# Patient Record
Sex: Female | Born: 1970 | Race: Black or African American | Hispanic: No | Marital: Single | Smoking: Never smoker
Health system: Southern US, Community
[De-identification: ages and names within clinical notes are randomized; demographics above are authoritative.]

## PROBLEM LIST (undated history)

## (undated) ENCOUNTER — Emergency Department: Payer: Self-pay

## (undated) DIAGNOSIS — N189 Chronic kidney disease, unspecified: Secondary | ICD-10-CM

## (undated) DIAGNOSIS — J189 Pneumonia, unspecified organism: Secondary | ICD-10-CM

## (undated) DIAGNOSIS — I10 Essential (primary) hypertension: Secondary | ICD-10-CM

## (undated) DIAGNOSIS — D649 Anemia, unspecified: Secondary | ICD-10-CM

## (undated) DIAGNOSIS — I499 Cardiac arrhythmia, unspecified: Secondary | ICD-10-CM

## (undated) DIAGNOSIS — J45909 Unspecified asthma, uncomplicated: Secondary | ICD-10-CM

## (undated) DIAGNOSIS — D219 Benign neoplasm of connective and other soft tissue, unspecified: Secondary | ICD-10-CM

## (undated) HISTORY — PX: TUBAL LIGATION: SHX77

## (undated) SURGERY — TEMPORARY DIALYSIS CATHETER
Anesthesia: LOCAL

---

## 2003-11-13 ENCOUNTER — Emergency Department: Payer: Self-pay | Admitting: Emergency Medicine

## 2003-11-13 ENCOUNTER — Other Ambulatory Visit: Payer: Self-pay

## 2004-02-16 ENCOUNTER — Emergency Department: Payer: Self-pay | Admitting: Emergency Medicine

## 2004-06-15 ENCOUNTER — Emergency Department: Payer: Self-pay | Admitting: Unknown Physician Specialty

## 2004-06-17 ENCOUNTER — Ambulatory Visit: Payer: Self-pay | Admitting: Unknown Physician Specialty

## 2004-07-02 ENCOUNTER — Emergency Department: Payer: Self-pay | Admitting: Emergency Medicine

## 2006-03-30 ENCOUNTER — Ambulatory Visit: Payer: Self-pay | Admitting: Emergency Medicine

## 2006-03-30 ENCOUNTER — Emergency Department: Payer: Self-pay | Admitting: Emergency Medicine

## 2006-10-10 IMAGING — US ABDOMEN ULTRASOUND
1 series · 17 of 25 positions shown · non-contrast
Comparison: none

REASON FOR EXAM: Abd Pain
COMMENTS:

[Series 1: abdomen ultrasound · 17 of 54 slices shown]
[im 1/54]
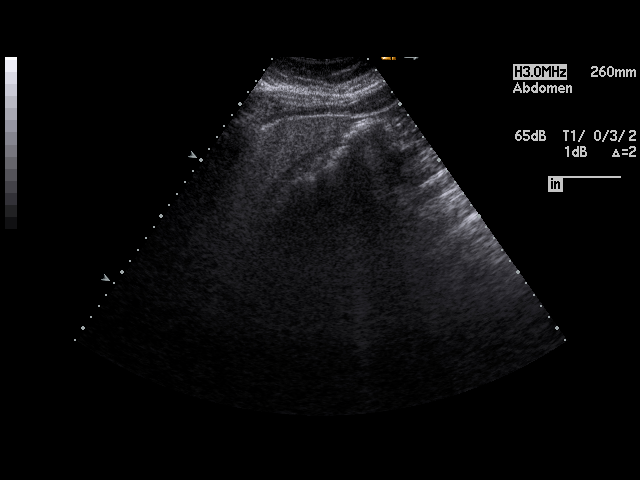
[im 5/54]
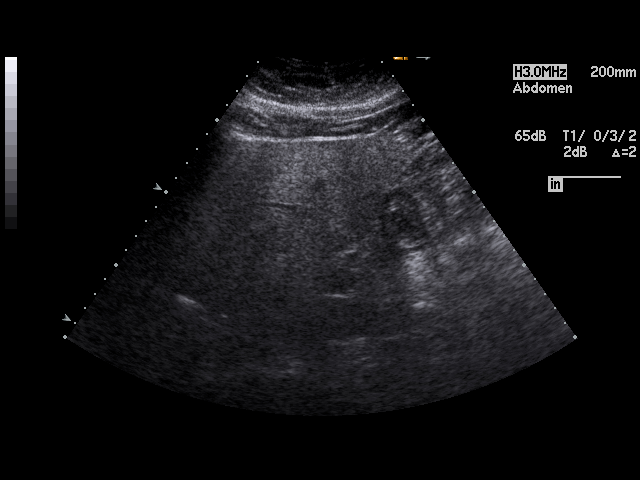
[im 7/54]
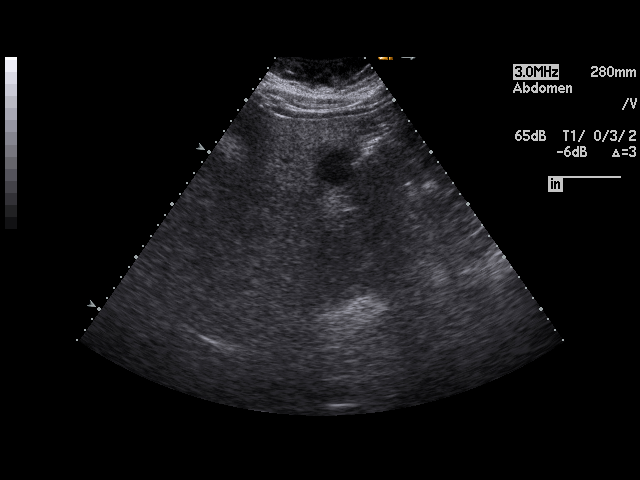
[im 12/54]
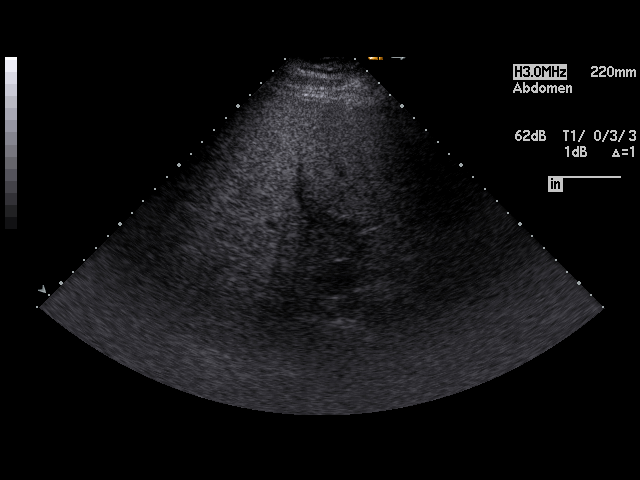
[im 14/54]
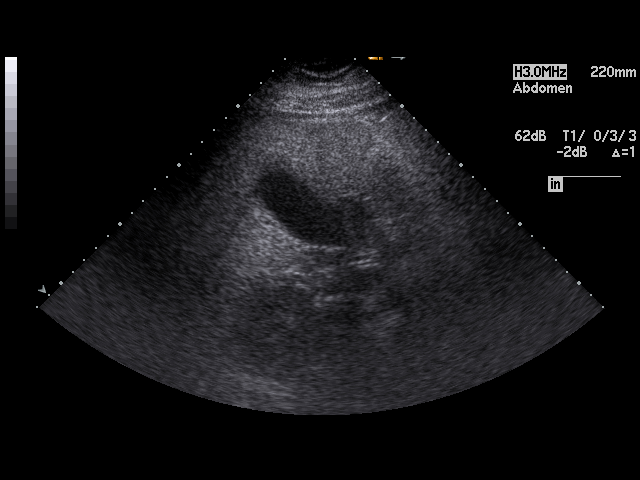
[im 18/54]
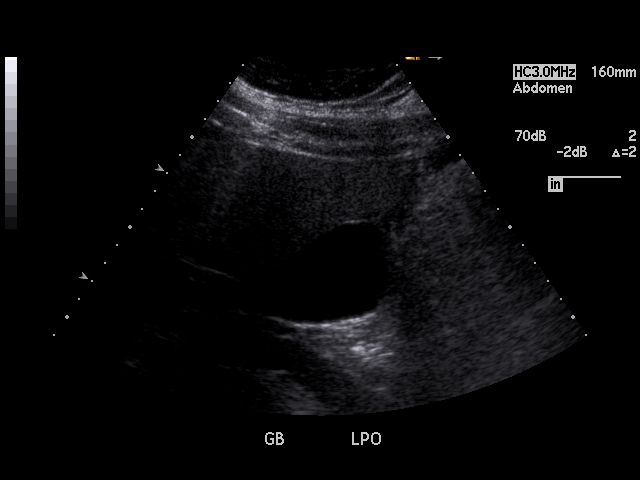
[im 20/54]
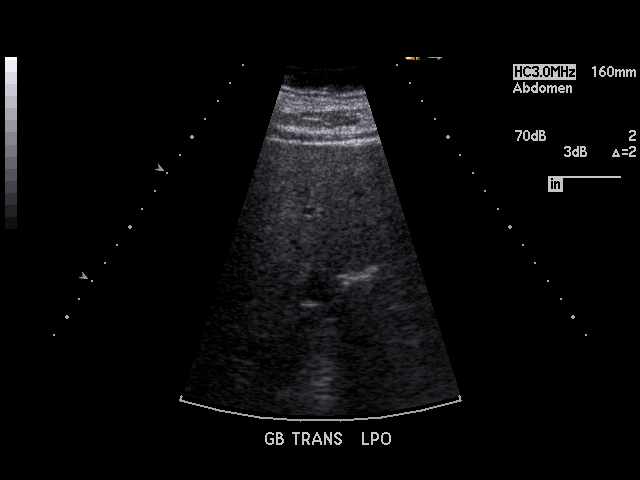
[im 25/54]
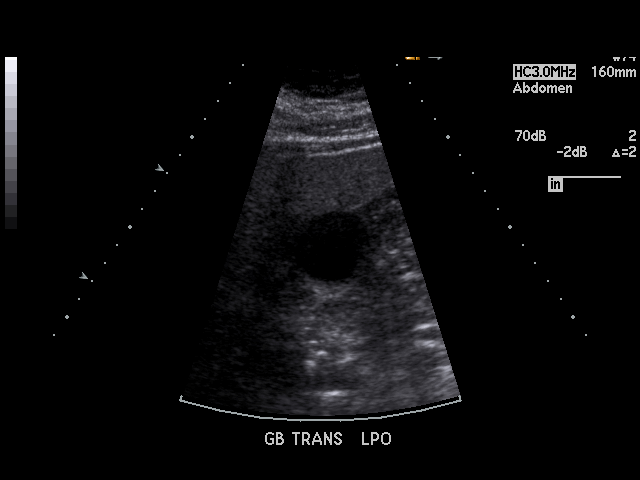
[im 27/54]
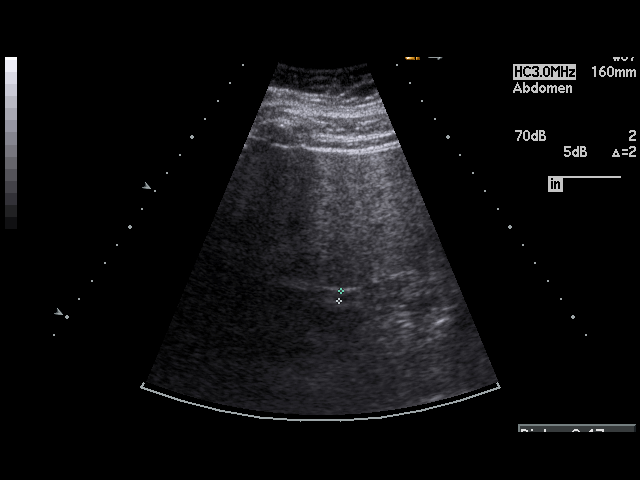
[im 29/54]
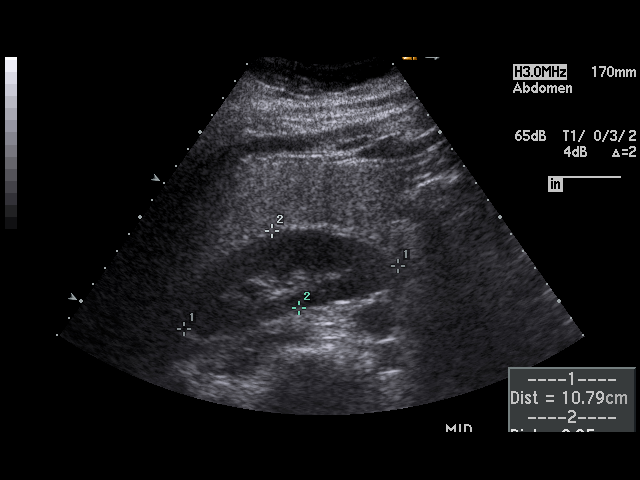
[im 34/54]
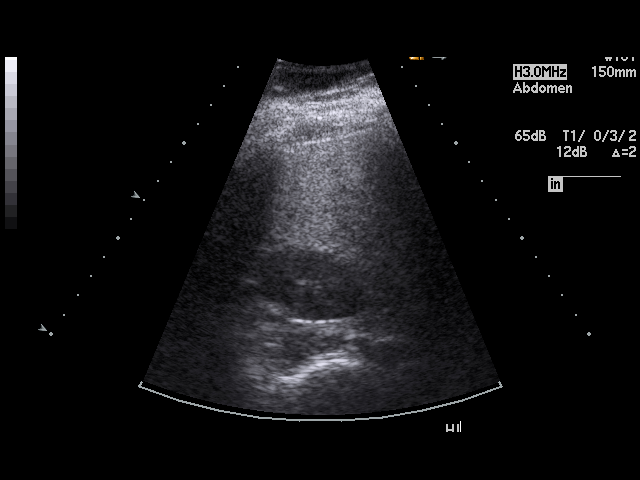
[im 36/54]
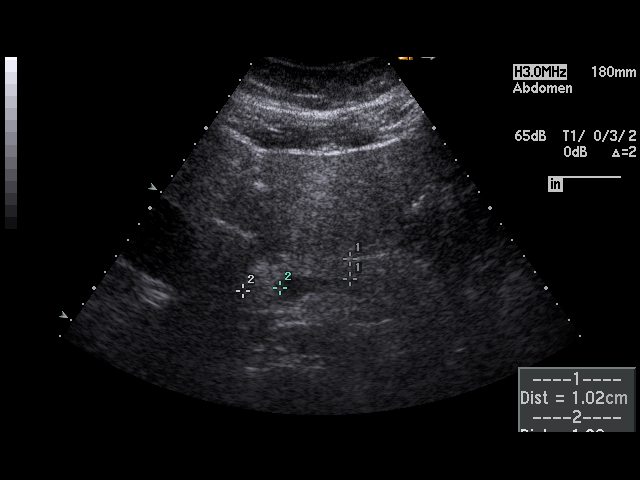
[im 40/54]
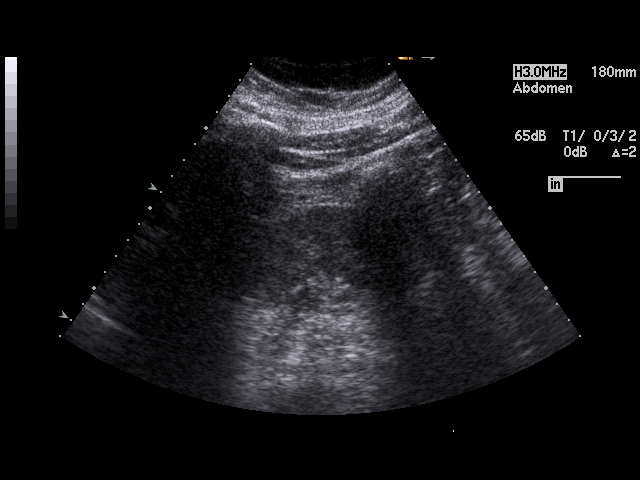
[im 42/54]
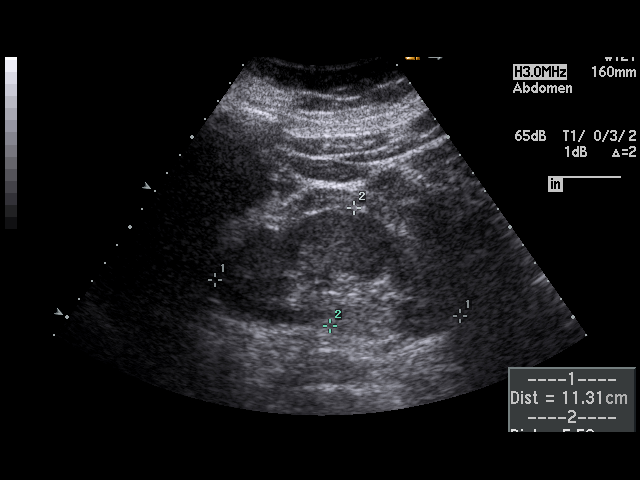
[im 47/54]
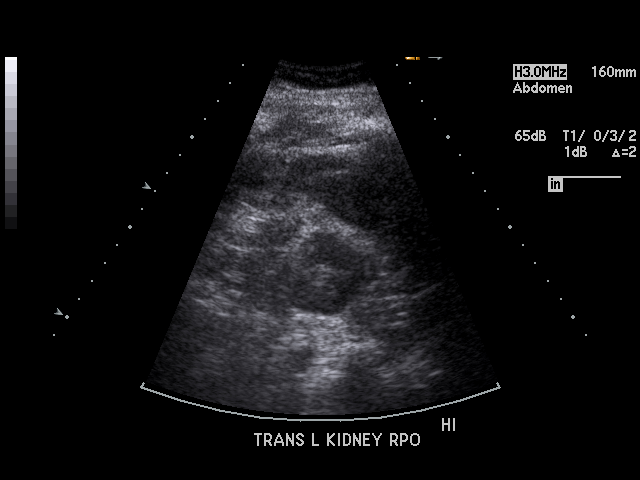
[im 49/54]
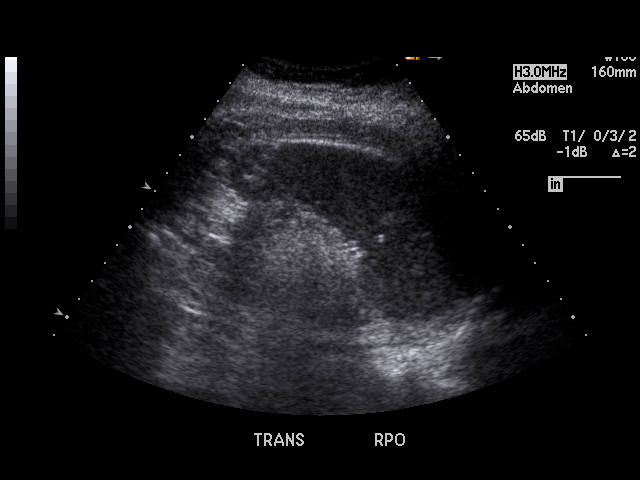
[im 54/54]
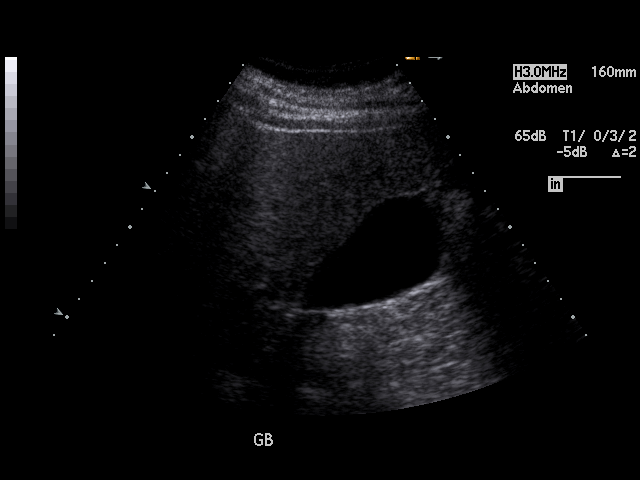

[17 of 25 positions shown; findings below may reference images not displayed]

PROCEDURE:     US  - US ABDOMEN GENERAL SURVEY  - June 17, 2004 [DATE]

RESULT:        The liver shows increased echogenicity suspicious for fatty
infiltration.  No focal hepatic mass lesions are seen.  The spleen size is
normal.  The pancreas is normal in appearance.  No gallstones are seen.
There is no thickening of the gallbladder wall.  The common bile duct
measures 4.7 mm in diameter which is within normal limits.   The kidneys
show no hydronephrosis.  There is no ascites.
IMPRESSION: 1.     Probably fatty infiltration of the liver.
2.     Otherwise normal study.

## 2007-09-08 ENCOUNTER — Other Ambulatory Visit: Payer: Self-pay

## 2007-09-08 ENCOUNTER — Emergency Department: Payer: Self-pay | Admitting: Emergency Medicine

## 2008-08-09 ENCOUNTER — Emergency Department: Payer: Self-pay | Admitting: Emergency Medicine

## 2010-01-08 ENCOUNTER — Emergency Department: Payer: Self-pay | Admitting: Emergency Medicine

## 2010-01-09 ENCOUNTER — Emergency Department: Payer: Self-pay | Admitting: Emergency Medicine

## 2010-06-12 ENCOUNTER — Emergency Department: Payer: Self-pay | Admitting: Emergency Medicine

## 2012-09-20 ENCOUNTER — Observation Stay: Payer: Self-pay | Admitting: Student

## 2012-09-20 LAB — CK TOTAL AND CKMB (NOT AT ARMC)
CK, Total: 109 U/L (ref 21–215)
CK, Total: 94 U/L (ref 21–215)
CK-MB: <0.5 ng/mL — ABNORMAL LOW (ref 0.5–3.6)
CK-MB: <0.5 ng/mL — ABNORMAL LOW (ref 0.5–3.6)

## 2012-09-20 LAB — CBC
MCH: 27.3 pg (ref 26.0–34.0)
MCHC: 33.5 g/dL (ref 32.0–36.0)
RBC: 4.3 10*6/uL (ref 3.80–5.20)
RDW: 18 % — ABNORMAL HIGH (ref 11.5–14.5)
WBC: 6 10*3/uL (ref 3.6–11.0)

## 2012-09-20 LAB — BASIC METABOLIC PANEL
Anion Gap: 5 — ABNORMAL LOW (ref 7–16)
Chloride: 107 mmol/L (ref 98–107)
Co2: 27 mmol/L (ref 21–32)
EGFR (African American): >60
EGFR (Non-African Amer.): >60

## 2012-09-20 LAB — TROPONIN I: Troponin-I: <0.02 ng/mL

## 2012-09-21 DIAGNOSIS — R079 Chest pain, unspecified: Secondary | ICD-10-CM

## 2012-09-21 LAB — CBC WITH DIFFERENTIAL/PLATELET
Basophil #: 0 10*3/uL (ref 0.0–0.1)
Basophil %: 0.6 %
Eosinophil #: 0.2 10*3/uL (ref 0.0–0.7)
HGB: 11.2 g/dL — ABNORMAL LOW (ref 12.0–16.0)
Lymphocyte #: 1.7 10*3/uL (ref 1.0–3.6)
Lymphocyte %: 32.1 %
MCH: 27 pg (ref 26.0–34.0)
MCHC: 33 g/dL (ref 32.0–36.0)
MCV: 82 fL (ref 80–100)
Monocyte #: 0.4 x10 3/mm (ref 0.2–0.9)
Monocyte %: 7.8 %
Neutrophil %: 56.6 %
Platelet: 160 10*3/uL (ref 150–440)
RDW: 18.2 % — ABNORMAL HIGH (ref 11.5–14.5)

## 2012-09-21 LAB — LIPID PANEL
Triglycerides: 97 mg/dL (ref 0–200)
VLDL Cholesterol, Calc: 19 mg/dL (ref 5–40)

## 2012-09-21 LAB — CK TOTAL AND CKMB (NOT AT ARMC)
CK, Total: 86 U/L (ref 21–215)
CK-MB: <0.5 ng/mL — ABNORMAL LOW (ref 0.5–3.6)

## 2012-09-21 LAB — HEMOGLOBIN A1C: Hemoglobin A1C: 6.1 % (ref 4.2–6.3)

## 2012-09-21 LAB — BASIC METABOLIC PANEL
BUN: 11 mg/dL (ref 7–18)
Calcium, Total: 8.8 mg/dL (ref 8.5–10.1)
Chloride: 104 mmol/L (ref 98–107)

## 2012-09-22 ENCOUNTER — Emergency Department: Payer: Self-pay | Admitting: Internal Medicine

## 2012-09-22 LAB — COMPREHENSIVE METABOLIC PANEL
Albumin: 3.9 g/dL (ref 3.4–5.0)
Alkaline Phosphatase: 57 U/L (ref 50–136)
Anion Gap: 9 (ref 7–16)
BUN: 10 mg/dL (ref 7–18)
Bilirubin,Total: 0.6 mg/dL (ref 0.2–1.0)
Calcium, Total: 8.9 mg/dL (ref 8.5–10.1)
Chloride: 104 mmol/L (ref 98–107)
Creatinine: 0.99 mg/dL (ref 0.60–1.30)
EGFR (African American): >60
EGFR (Non-African Amer.): >60
Glucose: 99 mg/dL (ref 65–99)
Osmolality: 273 (ref 275–301)
Potassium: 3.7 mmol/L (ref 3.5–5.1)
SGOT(AST): 64 U/L — ABNORMAL HIGH (ref 15–37)
Total Protein: 7.6 g/dL (ref 6.4–8.2)

## 2012-09-22 LAB — CBC
HCT: 38 % (ref 35.0–47.0)
MCH: 27.2 pg (ref 26.0–34.0)
MCHC: 33.5 g/dL (ref 32.0–36.0)
RBC: 4.69 10*6/uL (ref 3.80–5.20)
WBC: 7.8 10*3/uL (ref 3.6–11.0)

## 2013-01-07 ENCOUNTER — Emergency Department: Payer: Self-pay | Admitting: Emergency Medicine

## 2013-04-25 ENCOUNTER — Emergency Department: Payer: Self-pay | Admitting: Emergency Medicine

## 2013-04-25 LAB — COMPREHENSIVE METABOLIC PANEL
ALK PHOS: 70 U/L
Albumin: 4.2 g/dL (ref 3.4–5.0)
Anion Gap: 5 — ABNORMAL LOW (ref 7–16)
BILIRUBIN TOTAL: 0.7 mg/dL (ref 0.2–1.0)
BUN: 8 mg/dL (ref 7–18)
CHLORIDE: 101 mmol/L (ref 98–107)
Calcium, Total: 8.4 mg/dL — ABNORMAL LOW (ref 8.5–10.1)
Co2: 29 mmol/L (ref 21–32)
Creatinine: 1.05 mg/dL (ref 0.60–1.30)
GLUCOSE: 93 mg/dL (ref 65–99)
Osmolality: 268 (ref 275–301)
Potassium: 4 mmol/L (ref 3.5–5.1)
SGOT(AST): 111 U/L — ABNORMAL HIGH (ref 15–37)
SGPT (ALT): 84 U/L — ABNORMAL HIGH (ref 12–78)
Sodium: 135 mmol/L — ABNORMAL LOW (ref 136–145)
Total Protein: 8.1 g/dL (ref 6.4–8.2)

## 2013-04-25 LAB — CBC WITH DIFFERENTIAL/PLATELET
Basophil #: 0.1 10*3/uL (ref 0.0–0.1)
Basophil %: 1 %
Eosinophil #: 0.1 10*3/uL (ref 0.0–0.7)
Eosinophil %: 1.3 %
HCT: 37.5 % (ref 35.0–47.0)
HGB: 12.3 g/dL (ref 12.0–16.0)
LYMPHS ABS: 1.9 10*3/uL (ref 1.0–3.6)
Lymphocyte %: 27.2 %
MCH: 27.8 pg (ref 26.0–34.0)
MCHC: 32.8 g/dL (ref 32.0–36.0)
MCV: 85 fL (ref 80–100)
MONOS PCT: 7.8 %
Monocyte #: 0.5 x10 3/mm (ref 0.2–0.9)
NEUTROS PCT: 62.7 %
Neutrophil #: 4.4 10*3/uL (ref 1.4–6.5)
Platelet: 198 10*3/uL (ref 150–440)
RBC: 4.43 10*6/uL (ref 3.80–5.20)
RDW: 17.2 % — AB (ref 11.5–14.5)
WBC: 6.9 10*3/uL (ref 3.6–11.0)

## 2013-11-09 ENCOUNTER — Emergency Department: Payer: Self-pay | Admitting: Emergency Medicine

## 2013-11-18 DIAGNOSIS — I8393 Asymptomatic varicose veins of bilateral lower extremities: Secondary | ICD-10-CM | POA: Insufficient documentation

## 2013-11-18 DIAGNOSIS — I872 Venous insufficiency (chronic) (peripheral): Secondary | ICD-10-CM | POA: Insufficient documentation

## 2014-05-26 NOTE — H&P (Signed)
PATIENT NAME:  Megan Lambert, Megan Lambert MR#:  [id number] DATE OF BIRTH:  Jun 24, 1970  DATE OF ADMISSION:  09/20/2012  REFERRING PHYSICIAN: Hinda Kehr, MD  PRIMARY CARE PHYSICIAN: [facility]  CHIEF COMPLAINT: Chest pain.   HISTORY OF PRESENT ILLNESS: [name] is a nice 44 year old female with history of GERD and asthma who comes with new onset chest pain. Apparently, the patient was getting ready to go to church yesterday, got in the car, coming out of the car the patient started having some chest pain which lasted 10 to 15 minutes. It was really severe, 10 out of 10, and resolved by itself. The patient had chest pain associated with nausea located retrosternally with radiation up to the shoulder with numbness of the right upper extremity. At the same moment, the patient was short of breath and again the patient went up by herself. There were not any significant factors that were triggering the pain. The patient says that she was not doing any significant physical activity. The patient states that she has never had this happen before. During the day, the patient was fine. She just forgot about it and this morning just right after waking up the patient started having some chest pain again. It is retrosternal, more on the right side than the left, stabbing, associated with tingling of the right upper extremity. Today the patient did not have any shortness of breath. Yesterday the patient was sweating profusely, but not today. The pain today was 8 out of 10 and it lasted up until she arrived to the ER. This started around 8 and the patient started having some relief right after taking nitroglycerin here in the ER. During the time of my evaluation, the patient still had some pain, 2 to 3 out of 10, with the same characteristics. The patient is admitted for evaluation of this problem.   REVIEW OF SYSTEMS:  Twelve system review is done.  CONSTITUTIONAL: No fever, fatigue, weakness, weight loss or weight  gain.  EYES: No blurry vision, double vision.  EARS, NOSE, THROAT: No tinnitus, difficulty swallowing or postnasal drip.  RESPIRATORY: No cough, wheezing, hemoptysis, painful respirations or COPD. She has history of asthma and she only has asthma attacks whenever she is exposed to any allergens. It has not happened in a long time. She does not even take medications for it unless exacerbations.   CARDIOVASCULAR: No previous history of chest pain, orthopnea, edema, arrhythmias, palpitations or syncope.  GASTROINTESTINAL: No nausea, vomiting, abdominal pain, constipation or diarrhea. No melena.  GENITOURINARY: No dysuria, hematuria or changes in frequency.  GYNECOLOGIC: No breast masses.  ENDOCRINE: No polyuria, polydipsia, polyphagia, cold or heat intolerance. HEMATOLOGIC AND LYMPHATIC: No anemia, easy bruising or bleeding.  MUSCULOSKELETAL: No significant shoulder pain, back pain, redness or swelling of joints.  NEUROLOGIC: No numbness, tingling, CVAs or TIAs. PSYCHIATRIC: No significant insomnia, depression or anxiety.   PAST MEDICAL HISTORY: 1.  GERD. 2.  Asthma.   ALLERGIES: No known drug allergies.  PAST SURGICAL HISTORY: BTL.   MEDICATIONS: She does not take any medications at all. She used to take medications for GERD, but no longer.   FAMILY HISTORY: Positive for MI in her father at the age of 85. He died later on after another MI. CVA in her uncle. Diabetes in her mother. Denies any cancer.   SOCIAL HISTORY: The patient has never smoked. She drinks very rarely. She does not use drugs. She is unemployed at the moment. She has 2 kids  that live with her. She has been a little bit stressed about her unemployment. She is single.   PHYSICAL EXAMINATION: VITAL SIGNS: Blood pressure 149/84, pulse 84, respirations 18, temperature 98.5. Oxygen saturation 98% on room air.  GENERAL: The patient is alert and oriented x 3, in no acute distress. No respiratory distress. Hemodynamically stable.   HEENT: Pupils are equal and reactive. Extraocular movements are intact. Mucosa is moist. Anicteric sclerae. Pink conjunctivae. No oral lesions. No oropharyngeal exudates.  NECK: Supple. No JVD. No thyromegaly. No adenopathy. No rigidity.  HEART: Regular rate and rhythm. No murmurs, rubs or gallops are appreciated at this moment. No displaced PMI. No tenderness to palpation of anterior chest wall. Chest pain is not reproducible. LUNGS: Clear without any wheezing or crepitus. No use of accessory muscles.  ABDOMEN: Soft, nontender, nondistended. No hepatosplenomegaly. No masses. Bowel sounds are positive. GENITAL: Negative for external lesions.  EXTREMITIES: No edema, cyanosis or clubbing. Pulses +2. Capillary refill less than 3. The patient states she has edema of the lower extremities, but at this moment there is none. She had some changes of the skin secondary to varicose veins and chronic venous insufficiency.  MUSCULOSKELETAL: No significant joint effusions or joint edema.  SKIN: No rashes or petechiae. No new lesions. LYMPHATIC: Negative for lymphadenopathy in the neck or supraclavicular area.  VASCULAR: Good capillary refill. Pulses +2.  NEUROLOGIC: Cranial nerves II through XII intact. His strength is 5 out of 5 in all 4 extremities. No focal findings. PSYCHIATRIC: Normal without any signs of agitation. Alert and oriented x 3.   LABORATORY AND DIAGNOSTICS: Glucose 91, creatinine 0.8, sodium 139. Other electrolytes within normal limits. Pregnancy test is negative. Troponin is 0.02. White count is 6, hemoglobin is 11.8, platelet count 176.   EKG: No ST depression or elevation.   Chest x-ray: No abnormal findings.  ASSESSMENT AND PLAN: 1.  Chest pain. The patient is admitted for evaluation of chest pain, Myoview is going to be ordered tomorrow. Cardiac enzymes are negative. No ST depression or elevation on EKG. The patient has a really good story and the pain was relieved with nitroglycerin  for what we are going to add a beta blocker. I think at this moment we need to fully anticoagulate the patient, but if there are any changes in her troponins we are going to start her on a heparin drip. Right now we are just going to do regular deep vein thrombosis prophylaxis. Aspirin 325 mg given, started her on a statin, check lipid levels, check Hemoglobin A1c. Add beta blocker. Her vital signs are stable. The patient is going to be admitted for observation and do a Myoview in the morning. Since the patient has edema of the lower extremities on and off, we are going to get an echocardiogram. Restart proton pump inhibitors. 2.  Asthma is well controlled. P.r.n. nebulizers and albuterol.  3.  Gastrointestinal prophylaxis with proton pump inhibitor again.  CODE STATUS: The patient is a FULL CODE.   TIME SPENT: I spent about 50 minutes with this patient. ____________________________ [place] Sink, MD rsg:sb D: 09/20/2012 14:53:55 ET T: 09/20/2012 15:12:12 ET JOB#: [id number]  cc: [place] Sink, MD, <Dictator> Misa Fedorko America Brown MD ELECTRONICALLY SIGNED 10/01/2012 12:30

## 2014-05-26 NOTE — Discharge Summary (Signed)
PATIENT NAME:  Megan Lambert, Megan Lambert MR#:  [id number] DATE OF BIRTH:  Oct 20, 1970  DATE OF ADMISSION:  09/20/2012 DATE OF DISCHARGE:  09/21/2012  CHIEF COMPLAINT: Chest pain.  DISCHARGE DIAGNOSES: 1. Chest pain, noncardiac, possibly anxiety driven.  2. Hypertension.  3. Gastroesophageal reflux disease.  4. Asthma.   DISCHARGE MEDICATIONS:  1. Lisinopril 10 mg daily.  2. Aspirin 81 mg daily.   DIET: Low sodium, low fat, low cholesterol.   ACTIVITY: As tolerated.   DISCHARGE INSTRUCTIONS: Please follow with PCP within 1 to 2 weeks for blood pressure check.   DISPOSITION: Home.   HISTORY OF PRESENT ILLNESS AND HOSPITAL COURSE: For full details of H and P, please see the dictation on August 18 by Dr. Laurin Coder, but briefly, this is a 44 year old female with well-controlled asthma with episodic flares, GERD, who is on no significant medications, who came into the hospital with complaints of chest pain. The patient was getting ready to go to a new church, apparently got into the car and started to have some chest pain, lasting 10 to 15 minutes, with some radiation to the shoulder, numbness in the right upper extremity. She was admitted to the hospitalist service for further evaluation and management. She underwent cyclic cardiac markers which were all negative and underwent a stress test on the 19th as well as an echocardiogram. The echocardiogram showed normal EF of 55% to 60%. The stress test did not show any ischemia and was a low-risk study. She did have elevated blood pressure, and lisinopril was started here. Her LDL was in the 120s, and although statin was offered, she deferred and wanted to work on diet and exercise, and I informed her that she needs to recheck this within the next 4 to 6 months. She has been having some anxiety issues, including was about to go to a new church as well as lost her job recently, and her symptoms likely are anxiety related. At this point, she will be discharged with  outpatient followup and blood pressure monitoring by her PCP. She had A1c of 6.1. Troponins were negative x3, and TSH was 6.18. She had no fever or white count.   TOTAL TIME SPENT: 32 minutes.   CODE STATUS: The patient is full code.   ____________________________ Vivien Presto, MD sa:OSi D: 09/22/2012 11:16:03 ET T: 09/22/2012 12:05:10 ET JOB#: [id number]  cc: Vivien Presto, MD, <Dictator> Vivien Presto MD ELECTRONICALLY SIGNED 10/11/2012 12:04

## 2015-01-26 ENCOUNTER — Encounter: Payer: Self-pay | Admitting: Emergency Medicine

## 2015-01-26 ENCOUNTER — Emergency Department
Admission: EM | Admit: 2015-01-26 | Discharge: 2015-01-26 | Disposition: A | Discharge status: Home / Self Care | Payer: Self-pay | Attending: Emergency Medicine | Admitting: Emergency Medicine

## 2015-01-26 DIAGNOSIS — N3000 Acute cystitis without hematuria: Secondary | ICD-10-CM | POA: Insufficient documentation

## 2015-01-26 DIAGNOSIS — R1110 Vomiting, unspecified: Secondary | ICD-10-CM

## 2015-01-26 DIAGNOSIS — Z79899 Other long term (current) drug therapy: Secondary | ICD-10-CM | POA: Insufficient documentation

## 2015-01-26 DIAGNOSIS — I10 Essential (primary) hypertension: Secondary | ICD-10-CM | POA: Insufficient documentation

## 2015-01-26 DIAGNOSIS — R197 Diarrhea, unspecified: Secondary | ICD-10-CM | POA: Insufficient documentation

## 2015-01-26 HISTORY — DX: Essential (primary) hypertension: I10

## 2015-01-26 LAB — URINALYSIS COMPLETE WITH MICROSCOPIC (ARMC ONLY)
Bacteria, UA: NONE SEEN
Bilirubin Urine: NEGATIVE
GLUCOSE, UA: NEGATIVE mg/dL
KETONES UR: NEGATIVE mg/dL
NITRITE: NEGATIVE
Protein, ur: 100 mg/dL — AB
SPECIFIC GRAVITY, URINE: 1.019 (ref 1.005–1.030)
pH: 5 (ref 5.0–8.0)

## 2015-01-26 LAB — COMPREHENSIVE METABOLIC PANEL
ALK PHOS: 66 U/L (ref 38–126)
ALT: 41 U/L (ref 14–54)
AST: 96 U/L — AB (ref 15–41)
Albumin: 4.3 g/dL (ref 3.5–5.0)
Anion gap: 12 (ref 5–15)
BUN: 10 mg/dL (ref 6–20)
CALCIUM: 7.3 mg/dL — AB (ref 8.9–10.3)
CHLORIDE: 99 mmol/L — AB (ref 101–111)
CO2: 24 mmol/L (ref 22–32)
CREATININE: 0.75 mg/dL (ref 0.44–1.00)
GFR calc Af Amer: >60 mL/min (ref >60)
Glucose, Bld: 87 mg/dL (ref 65–99)
Potassium: 3.1 mmol/L — ABNORMAL LOW (ref 3.5–5.1)
Sodium: 135 mmol/L (ref 135–145)
Total Bilirubin: 1.1 mg/dL (ref 0.3–1.2)
Total Protein: 8.1 g/dL (ref 6.5–8.1)

## 2015-01-26 LAB — CBC
HCT: 32.3 % — ABNORMAL LOW (ref 35.0–47.0)
HEMOGLOBIN: 10.4 g/dL — AB (ref 12.0–16.0)
MCH: 27.3 pg (ref 26.0–34.0)
MCHC: 32.2 g/dL (ref 32.0–36.0)
MCV: 84.8 fL (ref 80.0–100.0)
Platelets: 173 10*3/uL (ref 150–440)
RBC: 3.81 MIL/uL (ref 3.80–5.20)
RDW: 17.1 % — AB (ref 11.5–14.5)
WBC: 5.2 10*3/uL (ref 3.6–11.0)

## 2015-01-26 LAB — LIPASE, BLOOD: LIPASE: 36 U/L (ref 11–51)

## 2015-01-26 MED ORDER — ONDANSETRON 4 MG PO TBDP
4.0000 mg | ORAL_TABLET | Freq: Once | ORAL | Status: AC
  Administered 2015-01-26: 4 mg via ORAL
  Filled 2015-01-26: qty 1

## 2015-01-26 MED ORDER — SULFAMETHOXAZOLE-TRIMETHOPRIM 800-160 MG PO TABS: 1.0000 | ORAL_TABLET | Freq: Two times a day (BID) | ORAL | Status: DC

## 2015-01-26 MED ORDER — ONDANSETRON HCL 4 MG PO TABS: 4.0000 mg | ORAL_TABLET | Freq: Three times a day (TID) | ORAL | Status: DC | PRN

## 2015-01-26 MED ORDER — ONDANSETRON 4 MG PO TBDP
4.0000 mg | ORAL_TABLET | Freq: Once | ORAL | Status: AC | PRN
  Administered 2015-01-26: 4 mg via ORAL
  Filled 2015-01-26: qty 1

## 2015-01-26 NOTE — ED Provider Notes (Signed)
[facility] Emergency Department Provider Note ____________________________________________  Time seen: Approximately 4:34 PM  I have reviewed the triage vital signs and the nursing notes.   HISTORY  Chief Complaint Abdominal Pain; Diarrhea; and Emesis   HPI Megan Lambert is a 44 y.o. female Megan Lambert for evaluation of nausea, vomiting, diarrhea, and abdominal pain. She states that she was awakened this morning at about 5:30 AM and had an episode of diarrhea and vomiting. Since that she went back to bed and since that time has had another episode of vomiting and diarrhea. She states that she has had intermittent abdominal cramping since this morning. She denies fever. She has been unable to tolerate food or fluids. She states her grandson was ill recently with similar symptoms.   Past Medical History  Diagnosis Date  . Hypertension     There are no active problems to display for this patient.   Past Surgical History  Procedure Laterality Date  . Tubal ligation      Current Outpatient Rx  Name  Route  Sig  Dispense  Refill  . hydrochlorothiazide (MICROZIDE) 12.5 MG capsule   Oral   Take 12.5 mg by mouth daily.         . ondansetron (ZOFRAN) 4 MG tablet   Oral   Take 1 tablet (4 mg total) by mouth every 8 (eight) hours as needed for nausea or vomiting.   20 tablet   0   . sulfamethoxazole-trimethoprim (BACTRIM DS,SEPTRA DS) 800-160 MG tablet   Oral   Take 1 tablet by mouth 2 (two) times daily.   10 tablet   0     Allergies Lisinopril  No family history on file.  Social History Social History  Substance Use Topics  . Smoking status: Never Smoker   . Smokeless tobacco: None  . Alcohol Use: Yes     Comment: occasionally    Review of Systems Constitutional: No fever/chills Eyes: No visual changes. ENT: No sore throat. Cardiovascular: Denies chest pain. Respiratory: Denies shortness of breath. Gastrointestinal:  Intermittent abdominal pain.  Positive for nausea and vomiting x 2.  Positive for diarrhea.  No constipation. Genitourinary: Negative for dysuria. Musculoskeletal: Negative for back pain. Skin: Negative for rash. Neurological: Negative for headaches, focal weakness or numbness.  10-point ROS otherwise negative.  ____________________________________________   PHYSICAL EXAM:  VITAL SIGNS: ED Triage Vitals  Enc Vitals Group     BP 01/26/15 1457 181/107 mmHg     Pulse Rate 01/26/15 1457 86     Resp --      Temp 01/26/15 1457 98.3 F (36.8 C)     Temp Source 01/26/15 1457 Oral     SpO2 01/26/15 1457 99 %     Weight 01/26/15 1457 240 lb (108.863 kg)     Height 01/26/15 1457 6' (1.829 m)     Head Cir --      Peak Flow --      Pain Score 01/26/15 1457 6     Pain Loc --      Pain Edu? --      Excl. in [place]? --     Constitutional: Alert and oriented. Well appearing and in no acute distress. Eyes: Conjunctivae are normal. PERRL. EOMI. Head: Atraumatic. Nose: No congestion/rhinnorhea. Mouth/Throat: Mucous membranes are moist.  Oropharynx non-erythematous. Neck: No stridor.   Cardiovascular: Normal rate, regular rhythm. Grossly normal heart sounds.  Good peripheral circulation. Respiratory: Normal respiratory effort.  No retractions. Lungs CTAB. Gastrointestinal:  Soft and nontender. No distention. No abdominal bruits. No CVA tenderness. Musculoskeletal: No lower extremity tenderness nor edema.  No joint effusions. Neurologic:  Normal speech and language. No gross focal neurologic deficits are appreciated. No gait instability. Skin:  Skin is warm, dry and intact. No rash noted. Psychiatric: Mood and affect are normal. Speech and behavior are normal.  ____________________________________________   LABS (all labs ordered are listed, but only abnormal results are displayed)  Labs Reviewed  COMPREHENSIVE METABOLIC PANEL - Abnormal; Notable for the following:    Potassium 3.1 (*)     Chloride 99 (*)    Calcium 7.3 (*)    AST 96 (*)    All other components within normal limits  CBC - Abnormal; Notable for the following:    Hemoglobin 10.4 (*)    HCT 32.3 (*)    RDW 17.1 (*)    All other components within normal limits  URINALYSIS COMPLETEWITH MICROSCOPIC ([facility] ONLY) - Abnormal; Notable for the following:    Color, Urine AMBER (*)    APPearance CLOUDY (*)    Hgb urine dipstick 1+ (*)    Protein, ur 100 (*)    Leukocytes, UA 2+ (*)    Squamous Epithelial / LPF 6-30 (*)    All other components within normal limits  LIPASE, BLOOD   ____________________________________________  EKG   ____________________________________________  RADIOLOGY   ____________________________________________   PROCEDURES  Procedure(s) performed: None  Critical Care performed: No  ____________________________________________   INITIAL IMPRESSION / ASSESSMENT AND PLAN / ED COURSE  Pertinent labs & imaging results that were available during my care of the patient were reviewed by me and considered in my medical decision making (see chart for details).  Discussed lab and urinalysis results with patient and her mother. Patient is to take the zofran 20-30 minutes before the bactrim. She is to follow up with the PCP of her choice when possible. She is to return to the emergency department for symptoms of concerns if unable to schedule an appointment. ____________________________________________   FINAL CLINICAL IMPRESSION(S) / ED DIAGNOSES  Final diagnoses:  Vomiting and diarrhea  Acute cystitis without hematuria      Victorino Dike, FNP 01/26/15 1727  Hinda Kehr, MD 01/26/15 2217

## 2015-01-26 NOTE — ED Notes (Signed)
Patient presents to the ED with abdominal pain, nausea, vomiting, and diarrhea.  Patient states abdominal pain started at 5:30am this morning.  Patient reports diarrhea x 2 today and vomiting x 2 as well.  Patient is complaining of abdominal pain around her umbilical area.  Area is nontender but patient states it feels, "upset, rumbling in there".

## 2015-01-26 NOTE — ED Notes (Signed)
C/o n/v/d

## 2015-02-01 ENCOUNTER — Ambulatory Visit: Payer: Self-pay | Admitting: Family Medicine

## 2015-02-15 ENCOUNTER — Ambulatory Visit: Payer: Medicaid Other | Admitting: Family Medicine

## 2017-01-02 ENCOUNTER — Other Ambulatory Visit: Payer: Self-pay

## 2017-01-02 ENCOUNTER — Encounter: Payer: Self-pay | Admitting: Emergency Medicine

## 2017-01-02 ENCOUNTER — Emergency Department
Admission: EM | Admit: 2017-01-02 | Discharge: 2017-01-02 | Disposition: A | Discharge status: Home / Self Care | Payer: Self-pay | Attending: Emergency Medicine | Admitting: Emergency Medicine

## 2017-01-02 DIAGNOSIS — J45909 Unspecified asthma, uncomplicated: Secondary | ICD-10-CM | POA: Insufficient documentation

## 2017-01-02 DIAGNOSIS — Z79899 Other long term (current) drug therapy: Secondary | ICD-10-CM | POA: Insufficient documentation

## 2017-01-02 DIAGNOSIS — I10 Essential (primary) hypertension: Secondary | ICD-10-CM | POA: Insufficient documentation

## 2017-01-02 HISTORY — DX: Unspecified asthma, uncomplicated: J45.909

## 2017-01-02 LAB — CBC WITH DIFFERENTIAL/PLATELET
Basophils Absolute: 0 10*3/uL (ref 0–0.1)
Basophils Relative: 1 %
EOS ABS: 0 10*3/uL (ref 0–0.7)
EOS PCT: 1 %
HCT: 27.7 % — ABNORMAL LOW (ref 35.0–47.0)
Hemoglobin: 9.3 g/dL — ABNORMAL LOW (ref 12.0–16.0)
LYMPHS PCT: 33 %
Lymphs Abs: 1.1 10*3/uL (ref 1.0–3.6)
MCH: 29.9 pg (ref 26.0–34.0)
MCHC: 33.5 g/dL (ref 32.0–36.0)
MCV: 89.4 fL (ref 80.0–100.0)
MONO ABS: 0.3 10*3/uL (ref 0.2–0.9)
Monocytes Relative: 9 %
Neutro Abs: 1.9 10*3/uL (ref 1.4–6.5)
Neutrophils Relative %: 56 %
PLATELETS: 119 10*3/uL — AB (ref 150–440)
RBC: 3.1 MIL/uL — ABNORMAL LOW (ref 3.80–5.20)
RDW: 17.3 % — AB (ref 11.5–14.5)
WBC: 3.4 10*3/uL — AB (ref 3.6–11.0)

## 2017-01-02 LAB — COMPREHENSIVE METABOLIC PANEL
ALT: 57 U/L — ABNORMAL HIGH (ref 14–54)
AST: 155 U/L — ABNORMAL HIGH (ref 15–41)
Albumin: 4.4 g/dL (ref 3.5–5.0)
Alkaline Phosphatase: 72 U/L (ref 38–126)
Anion gap: 17 — ABNORMAL HIGH (ref 5–15)
BUN: 14 mg/dL (ref 6–20)
CHLORIDE: 99 mmol/L — AB (ref 101–111)
CO2: 23 mmol/L (ref 22–32)
Calcium: 7.9 mg/dL — ABNORMAL LOW (ref 8.9–10.3)
Creatinine, Ser: 1 mg/dL (ref 0.44–1.00)
Glucose, Bld: 92 mg/dL (ref 65–99)
POTASSIUM: 3.7 mmol/L (ref 3.5–5.1)
SODIUM: 139 mmol/L (ref 135–145)
Total Bilirubin: 1.5 mg/dL — ABNORMAL HIGH (ref 0.3–1.2)
Total Protein: 7.6 g/dL (ref 6.5–8.1)

## 2017-01-02 LAB — TROPONIN I

## 2017-01-02 MED ORDER — HYDROCHLOROTHIAZIDE 25 MG PO TABS
25.0000 mg | ORAL_TABLET | Freq: Every day | ORAL | Status: DC
  Administered 2017-01-02: 25 mg via ORAL

## 2017-01-02 MED ORDER — HYDROCHLOROTHIAZIDE 25 MG PO TABS
ORAL_TABLET | ORAL | Status: AC
  Filled 2017-01-02: qty 1

## 2017-01-02 MED ORDER — HYDROCHLOROTHIAZIDE 12.5 MG PO CAPS
12.5000 mg | ORAL_CAPSULE | Freq: Every day | ORAL | 0 refills | Status: DC
Start: 2017-01-02 — End: 2018-03-29

## 2017-01-02 NOTE — ED Notes (Signed)
Pt presents with c/o HTN. States she has been out of her bp meds for 2 weeks and that she began to feel like her pressure was up last night. She called for appt with Dr. Nicki Reaper and could not get an appt till 12/6. Pt alert & oriented with NAD noted.

## 2017-01-02 NOTE — ED Triage Notes (Signed)
Pt presents to ED via POV with c/o hypertension. Pt states BP was elevated last night, attempted to get PCP appt due to not having any of her BP medications but could not be seen until 12/6. Pt states she did not come from her PCP office, and denies any SHOB at this time.

## 2017-01-02 NOTE — Discharge Instructions (Signed)
Please take your medication as prescribed.  Please follow-up with your doctor next week as scheduled.  Your lab work today does show slight dehydration, please increase the amount of fluids you are drinking.  Return to the emergency department for any symptom personally concerning to yourself.

## 2017-01-02 NOTE — ED Notes (Signed)
Pt discharged home after verbalizing understanding of discharge instructions; nad noted. 

## 2017-01-02 NOTE — ED Provider Notes (Addendum)
[facility] Emergency Department Provider Note  Time seen: 7:00 PM  I have reviewed the triage vital signs and the nursing notes.   HISTORY  Chief Complaint Hypertension    HPI Megan Lambert is a 46 y.o. female with a past medical history of asthma, hypertension, presents to the emergency department for elevated blood pressure.  According to the patient she has been out of her blood pressure medications, has an appointment with her primary care doctor but not until next week 01/08/17.  Patient denies any symptoms such as lightheadedness, blurred vision, headache or weakness.  Patient states she was concerned because her blood pressure was elevated and was hoping to get a controlled before her PCP visit.   Past Medical History:  Diagnosis Date  . Asthma   . Hypertension     There are no active problems to display for this patient.   Past Surgical History:  Procedure Laterality Date  . TUBAL LIGATION      Prior to Admission medications   Medication Sig Start Date End Date Taking? Authorizing Provider  hydrochlorothiazide (MICROZIDE) 12.5 MG capsule Take 12.5 mg by mouth daily.    [provider]  ondansetron (ZOFRAN) 4 MG tablet Take 1 tablet (4 mg total) by mouth every 8 (eight) hours as needed for nausea or vomiting. 01/26/15   Triplett, Johnette Abraham B, FNP  sulfamethoxazole-trimethoprim (BACTRIM DS,SEPTRA DS) 800-160 MG tablet Take 1 tablet by mouth 2 (two) times daily. 01/26/15   Victorino Dike, FNP    Allergies  Allergen Reactions  . Lisinopril Swelling    History reviewed. No pertinent family history.  Social History Social History   Tobacco Use  . Smoking status: Never Smoker  . Smokeless tobacco: Never Used  Substance Use Topics  . Alcohol use: Yes    Comment: occasionally  . Drug use: No    Review of Systems Constitutional: Negative for fever. Cardiovascular: Negative for chest pain. Respiratory: Negative for shortness  of breath. Gastrointestinal: Negative for abdominal pain Musculoskeletal: Mild back pain last night.  None today. Neurological: Mild headache yesterday, took medication and it completely resolved. All other ROS negative  ____________________________________________   PHYSICAL EXAM:  VITAL SIGNS: ED Triage Vitals  Enc Vitals Group     BP 01/02/17 1659 (!) 139/100     Pulse Rate 01/02/17 1659 (!) 104     Resp 01/02/17 1659 (!) 22     Temp 01/02/17 1659 98.2 F (36.8 C)     Temp Source 01/02/17 1659 Oral     SpO2 01/02/17 1659 96 %     Weight 01/02/17 1700 220 lb (99.8 kg)     Height 01/02/17 1700 6' (1.829 m)     Head Circumference --      Peak Flow --      Pain Score 01/02/17 1705 0     Pain Loc --      Pain Edu? --      Excl. in [place]? --    Constitutional: Alert and oriented. Well appearing and in no distress. Eyes: Normal exam ENT   Head: Normocephalic and atraumatic.   Mouth/Throat: Mucous membranes are moist. Cardiovascular: Normal rate, regular rhythm. No murmur Respiratory: Normal respiratory effort without tachypnea nor retractions. Breath sounds are clear Gastrointestinal: Soft and nontender. No distention.  Musculoskeletal: Nontender with normal range of motion in all extremities.  Neurologic:  Normal speech and language. No gross focal neurologic deficits. Skin:  Skin is warm, dry and intact.  Psychiatric: Mood and affect are normal.  ____________________________________________    EKG  EKG reviewed and interpreted by myself shows sinus tachycardia 109 bpm with a narrow QRS, left axis deviation, slight QTC prolongation otherwise normal intervals with nonspecific ST changes.  ____________________________________________   INITIAL IMPRESSION / ASSESSMENT AND PLAN / ED COURSE  Pertinent labs & imaging results that were available during my care of the patient were reviewed by me and considered in my medical decision making (see chart for  details).  Patient presents to the emergency department for elevated blood pressure out of her blood pressure medications.  Differential would include hypertension, medication management, medication refill.  Overall the patient appears very well has no complaints with an overall normal physical examination.  Labs show a slight anion gap, I discussed increasing oral hydration.  We will start the patient hydrochlorothiazide 25 mg which she was prescribed previously.  Patient agreeable to this plan and will follow up with her doctor on 6 December as scheduled.  I discussed return precautions.  I reviewed the patient's records, no recent records, records available are non-contributory to today's visit.  ____________________________________________   FINAL CLINICAL IMPRESSION(S) / ED DIAGNOSES  Hypertension     Harvest Dark, MD 01/02/17 [place]    Harvest Dark, MD 01/02/17 1904

## 2017-02-25 ENCOUNTER — Emergency Department: Payer: Self-pay

## 2017-02-25 ENCOUNTER — Emergency Department
Admission: EM | Admit: 2017-02-25 | Discharge: 2017-02-25 | Disposition: A | Discharge status: Home / Self Care | Payer: Self-pay | Attending: Emergency Medicine | Admitting: Emergency Medicine

## 2017-02-25 DIAGNOSIS — J069 Acute upper respiratory infection, unspecified: Secondary | ICD-10-CM | POA: Insufficient documentation

## 2017-02-25 DIAGNOSIS — J209 Acute bronchitis, unspecified: Secondary | ICD-10-CM | POA: Insufficient documentation

## 2017-02-25 DIAGNOSIS — I10 Essential (primary) hypertension: Secondary | ICD-10-CM | POA: Insufficient documentation

## 2017-02-25 DIAGNOSIS — Z79899 Other long term (current) drug therapy: Secondary | ICD-10-CM | POA: Insufficient documentation

## 2017-02-25 DIAGNOSIS — J45909 Unspecified asthma, uncomplicated: Secondary | ICD-10-CM | POA: Insufficient documentation

## 2017-02-25 LAB — INFLUENZA PANEL BY PCR (TYPE A & B)
INFLBPCR: NEGATIVE
Influenza A By PCR: NEGATIVE

## 2017-02-25 LAB — GROUP A STREP BY PCR: GROUP A STREP BY PCR: NOT DETECTED

## 2017-02-25 MED ORDER — ALBUTEROL SULFATE HFA 108 (90 BASE) MCG/ACT IN AERS: 2.0000 | INHALATION_SPRAY | RESPIRATORY_TRACT | 0 refills | Status: DC | PRN

## 2017-02-25 MED ORDER — PREDNISONE 50 MG PO TABS: 50.0000 mg | ORAL_TABLET | Freq: Every day | ORAL | 0 refills | Status: DC

## 2017-02-25 MED ORDER — PSEUDOEPH-BROMPHEN-DM 30-2-10 MG/5ML PO SYRP: 10.0000 mL | ORAL_SOLUTION | Freq: Four times a day (QID) | ORAL | 0 refills | Status: DC | PRN

## 2017-02-25 NOTE — ED Provider Notes (Signed)
[facility] Emergency Department Provider Note  ____________________________________________  Time seen: Approximately 9:02 PM  I have reviewed the triage vital signs and the nursing notes.   HISTORY  Chief Complaint Cough and Sore Throat    HPI Megan Lambert is a 47 y.o. female who presents the emergency department complaining of nasal congestion, sore throat, ear pressure, cough.  Patient reports that symptoms have been ongoing for the past 3-4 days.  Patient denies any fevers, neck pain or stiffness, chest pain, shortness of breath, abdominal pain.  Patient has had some posttussive emesis x1.  No continued emesis.  No diarrhea or constipation.  Patient is not tried any medications for this complaint prior to arrival.  Patient does have a history of asthma but states that she does not have her inhaler at this time.  Patient denies any audible wheezing.  No other injury or complaint.  Past Medical History:  Diagnosis Date  . Asthma   . Hypertension     There are no active problems to display for this patient.   Past Surgical History:  Procedure Laterality Date  . TUBAL LIGATION      Prior to Admission medications   Medication Sig Start Date End Date Taking? Authorizing Provider  albuterol (PROVENTIL HFA;VENTOLIN HFA) 108 (90 Base) MCG/ACT inhaler Inhale 2 puffs into the lungs every 4 (four) hours as needed for wheezing or shortness of breath. 02/25/17   Cuthriell, Charline Bills, PA-C  brompheniramine-pseudoephedrine-DM 30-2-10 MG/5ML syrup Take 10 mLs by mouth 4 (four) times daily as needed. 02/25/17   Cuthriell, Charline Bills, PA-C  hydrochlorothiazide (MICROZIDE) 12.5 MG capsule Take 1 capsule (12.5 mg total) by mouth daily. 01/02/17   Harvest Dark, MD  ondansetron (ZOFRAN) 4 MG tablet Take 1 tablet (4 mg total) by mouth every 8 (eight) hours as needed for nausea or vomiting. 01/26/15   Triplett, Johnette Abraham B, FNP  predniSONE (DELTASONE) 50 MG tablet Take  1 tablet (50 mg total) by mouth daily with breakfast. 02/25/17   Cuthriell, Charline Bills, PA-C  sulfamethoxazole-trimethoprim (BACTRIM DS,SEPTRA DS) 800-160 MG tablet Take 1 tablet by mouth 2 (two) times daily. 01/26/15   Triplett, Johnette Abraham B, FNP    Allergies Lisinopril  No family history on file.  Social History Social History   Tobacco Use  . Smoking status: Never Smoker  . Smokeless tobacco: Never Used  Substance Use Topics  . Alcohol use: Yes    Comment: occasionally  . Drug use: No     Review of Systems  Constitutional: No fever/chills Eyes: No visual changes. No discharge ENT: Positive for nasal congestion, sore throat, ear pressure Cardiovascular: no chest pain. Respiratory: Positive cough. No SOB. Gastrointestinal: No abdominal pain.  One episode of posttussive emesis..  No diarrhea.  No constipation. Musculoskeletal: Negative for musculoskeletal pain. Skin: Negative for rash, abrasions, lacerations, ecchymosis. Neurological: Negative for headaches, focal weakness or numbness. 10-point ROS otherwise negative.  ____________________________________________   PHYSICAL EXAM:  VITAL SIGNS: ED Triage Vitals [02/25/17 1941]  Enc Vitals Group     BP 104/69     Pulse Rate (!) 111     Resp 17     Temp 98.7 F (37.1 C)     Temp Source Oral     SpO2 97 %     Weight 220 lb (99.8 kg)     Height 6' (1.829 m)     Head Circumference      Peak Flow      Pain Score 8  Pain Loc      Pain Edu?      Excl. in [place]?      Constitutional: Alert and oriented. Well appearing and in no acute distress. Eyes: Conjunctivae are normal. PERRL. EOMI. Head: Atraumatic. ENT:      Ears: EACs and TMs unremarkable bilaterally.      Nose: Moderate clear congestion/rhinnorhea.      Mouth/Throat: Mucous membranes are moist.  Pharynx is very mildly erythematous but nonedematous.  Uvula is midline.  Tonsils unremarkable bilaterally. Neck: No stridor.  Hematological/Lymphatic/Immunilogical:  Scattered, nontender anterior cervical lymphadenopathy. Cardiovascular: Normal rate, regular rhythm. Normal S1 and S2.  Good peripheral circulation. Respiratory: Normal respiratory effort without tachypnea or retractions. Lungs with a few scattered expiratory wheezes bilateral lower lung fields.  No rales or rhonchi.Kermit Balo air entry to the bases with no decreased or absent breath sounds. Musculoskeletal: Full range of motion to all extremities. No gross deformities appreciated. Neurologic:  Normal speech and language. No gross focal neurologic deficits are appreciated.  Skin:  Skin is warm, dry and intact. No rash noted. Psychiatric: Mood and affect are normal. Speech and behavior are normal. Patient exhibits appropriate insight and judgement.   ____________________________________________   LABS (all labs ordered are listed, but only abnormal results are displayed)  Labs Reviewed  GROUP A STREP BY PCR  INFLUENZA PANEL BY PCR (TYPE A & B)   ____________________________________________  EKG   ____________________________________________  RADIOLOGY Diamantina Providence Cuthriell, personally viewed and evaluated these images (plain radiographs) as part of my medical decision making, as well as reviewing the written report by the radiologist.  Dg Chest 2 View  Result Date: 02/25/2017 CLINICAL DATA:  Cough, sore throat and headache. History of asthma and hypertension. EXAM: CHEST  2 VIEW COMPARISON:  01/07/2013; 09/20/2012; 01/08/2010 FINDINGS: Grossly unchanged cardiac silhouette and mediastinal contours. There is persistent mild elevation of the left hemidiaphragm with associated left basilar linear heterogeneous opacities, favored to represent atelectasis or scar. No new focal airspace opacities. No pleural effusion or pneumothorax. No evidence of edema. No acute osseus abnormalities. IMPRESSION: Chronic left basilar atelectasis/scar without superimposed acute cardiopulmonary disease.  Electronically Signed   By: Sandi Mariscal M.D.   On: 02/25/2017 20:01    ____________________________________________    PROCEDURES  Procedure(s) performed:    Procedures    Medications - No data to display   ____________________________________________   INITIAL IMPRESSION / ASSESSMENT AND PLAN / ED COURSE  Pertinent labs & imaging results that were available during my care of the patient were reviewed by me and considered in my medical decision making (see chart for details).  Review of the [place] CSRS was performed in accordance of the [place] prior to dispensing any controlled drugs.     Patient's diagnosis is consistent with bronchitis.  Initial differential included viral URI, strep, influenza, bronchitis, pneumonia.  Chest x-ray reveals no consolidation consistent with pneumonia.  Influenza and strep testing is negative.. Patient will be discharged home with prescriptions for prednisone, albuterol, Bromfed cough syrup. Patient is to follow up with primary care as needed or otherwise directed. Patient is given ED precautions to return to the ED for any worsening or new symptoms.     ____________________________________________  FINAL CLINICAL IMPRESSION(S) / ED DIAGNOSES  Final diagnoses:  Viral URI  Acute bronchitis, unspecified organism      NEW MEDICATIONS STARTED DURING THIS VISIT:  ED Discharge Orders        Ordered    predniSONE (DELTASONE) 50 MG  tablet  Daily with breakfast     02/25/17 2111    albuterol (PROVENTIL HFA;VENTOLIN HFA) 108 (90 Base) MCG/ACT inhaler  Every 4 hours PRN     02/25/17 2111    brompheniramine-pseudoephedrine-DM 30-2-10 MG/5ML syrup  4 times daily PRN     02/25/17 2111          This chart was dictated using voice recognition software/Dragon. Despite best efforts to proofread, errors can occur which can change the meaning. Any change was purely unintentional.    Darletta Moll, PA-C 02/25/17 2117    Rudene Re, MD 02/25/17 2329

## 2017-02-25 NOTE — ED Triage Notes (Signed)
Patient c/o cough, sore throat, and headache beginning Sunday.

## 2017-02-25 NOTE — ED Notes (Signed)
Patient reports HA, sore throat, productive cough with yellow phlegm, and general malaise that began on Sunday. Reports she believes she had fever earlier today and yesterday, but unsure of temp.

## 2017-03-04 ENCOUNTER — Emergency Department
Admission: EM | Admit: 2017-03-04 | Discharge: 2017-03-04 | Disposition: A | Discharge status: Home / Self Care | Payer: Self-pay | Attending: Emergency Medicine | Admitting: Emergency Medicine

## 2017-03-04 ENCOUNTER — Other Ambulatory Visit: Payer: Self-pay

## 2017-03-04 ENCOUNTER — Encounter: Payer: Self-pay | Admitting: Emergency Medicine

## 2017-03-04 ENCOUNTER — Emergency Department: Payer: Self-pay

## 2017-03-04 DIAGNOSIS — J45909 Unspecified asthma, uncomplicated: Secondary | ICD-10-CM | POA: Insufficient documentation

## 2017-03-04 DIAGNOSIS — J40 Bronchitis, not specified as acute or chronic: Secondary | ICD-10-CM

## 2017-03-04 DIAGNOSIS — J209 Acute bronchitis, unspecified: Secondary | ICD-10-CM | POA: Insufficient documentation

## 2017-03-04 DIAGNOSIS — Z79899 Other long term (current) drug therapy: Secondary | ICD-10-CM | POA: Insufficient documentation

## 2017-03-04 DIAGNOSIS — I10 Essential (primary) hypertension: Secondary | ICD-10-CM | POA: Insufficient documentation

## 2017-03-04 MED ORDER — GUAIFENESIN-CODEINE 100-10 MG/5ML PO SOLN: 5.0000 mL | Freq: Four times a day (QID) | ORAL | 0 refills | Status: DC | PRN

## 2017-03-04 MED ORDER — HYDROCOD POLST-CPM POLST ER 10-8 MG/5ML PO SUER
5.0000 mL | Freq: Once | ORAL | Status: AC
  Administered 2017-03-04: 5 mL via ORAL
  Filled 2017-03-04: qty 5

## 2017-03-04 MED ORDER — SODIUM CHLORIDE 0.9 % IV BOLUS (SEPSIS)
1000.0000 mL | Freq: Once | INTRAVENOUS | Status: AC
  Administered 2017-03-04: 1000 mL via INTRAVENOUS

## 2017-03-04 MED ORDER — AZITHROMYCIN 500 MG PO TABS
500.0000 mg | ORAL_TABLET | Freq: Once | ORAL | Status: AC
  Administered 2017-03-04: 500 mg via ORAL
  Filled 2017-03-04: qty 1

## 2017-03-04 MED ORDER — AZITHROMYCIN 250 MG PO TABS: 250.0000 mg | ORAL_TABLET | Freq: Every day | ORAL | 0 refills | Status: DC

## 2017-03-04 NOTE — ED Notes (Signed)
Pt reports that she feels better. Pt is able to ambulate to and from the bathroom without difficulty.

## 2017-03-04 NOTE — ED Triage Notes (Signed)
Pt to ED via POV c/o cough, congestion. Pt states that she was seen last week and diagnosed with URI, Pt was given prednisone, inhaler, and cough medication but she has not gotten any better. Pt tachycardic in triage at 137. Pt in NAD at this time.

## 2017-03-04 NOTE — ED Provider Notes (Signed)
[facility] Emergency Department Provider Note  Time seen: 3:20 PM  I have reviewed the triage vital signs and the nursing notes.   HISTORY  Chief Complaint Cough and Tachycardia    HPI Megan Lambert is a 47 y.o. female with a past medical history of asthma, hypertension presents to the emergency department with continued cough congestion times 2 weeks.  According to the patient she was seen here last week diagnosed with an upper respiratory infection given cough medication and prednisone.  Patient states she completed both courses of medication without improvement.  Continues to cough with occasional sputum production.  Patient has albuterol inhaler at home which she has been using with some relief.  Denies any chest pain, fever, abdominal pain, vomiting or diarrhea.  Patient states she continues to have a cough with occasional headache which is why she presented back to the emergency department today.  Patient is out of her cough medication.   Past Medical History:  Diagnosis Date  . Asthma   . Hypertension     There are no active problems to display for this patient.   Past Surgical History:  Procedure Laterality Date  . TUBAL LIGATION      Prior to Admission medications   Medication Sig Start Date End Date Taking? Authorizing Provider  albuterol (PROVENTIL HFA;VENTOLIN HFA) 108 (90 Base) MCG/ACT inhaler Inhale 2 puffs into the lungs every 4 (four) hours as needed for wheezing or shortness of breath. 02/25/17   Cuthriell, Charline Bills, PA-C  brompheniramine-pseudoephedrine-DM 30-2-10 MG/5ML syrup Take 10 mLs by mouth 4 (four) times daily as needed. 02/25/17   Cuthriell, Charline Bills, PA-C  hydrochlorothiazide (MICROZIDE) 12.5 MG capsule Take 1 capsule (12.5 mg total) by mouth daily. 01/02/17   Harvest Dark, MD  ondansetron (ZOFRAN) 4 MG tablet Take 1 tablet (4 mg total) by mouth every 8 (eight) hours as needed for nausea or vomiting. 01/26/15    Triplett, Johnette Abraham B, FNP  predniSONE (DELTASONE) 50 MG tablet Take 1 tablet (50 mg total) by mouth daily with breakfast. 02/25/17   Cuthriell, Charline Bills, PA-C  sulfamethoxazole-trimethoprim (BACTRIM DS,SEPTRA DS) 800-160 MG tablet Take 1 tablet by mouth 2 (two) times daily. 01/26/15   Victorino Dike, FNP    Allergies  Allergen Reactions  . Lisinopril Swelling    No family history on file.  Social History Social History   Tobacco Use  . Smoking status: Never Smoker  . Smokeless tobacco: Never Used  Substance Use Topics  . Alcohol use: Yes    Comment: occasionally  . Drug use: No    Review of Systems Constitutional: Negative for fever. Eyes: Negative for visual complaints ENT: She states continued congestion times 2 weeks. Cardiovascular: Negative for chest pain. Respiratory: Positive for shortness of breath with cough with occasional yellow sputum production Gastrointestinal: Negative for abdominal pain, vomiting  Genitourinary: Negative for urinary compaints Musculoskeletal: Negative for pain or swelling Skin: Negative for skin complaints  Neurological: Occasional headache with her cough. All other ROS negative  ____________________________________________   PHYSICAL EXAM:  VITAL SIGNS: ED Triage Vitals  Enc Vitals Group     BP 03/04/17 1357 (!) 135/95     Pulse Rate 03/04/17 1357 (!) 139     Resp 03/04/17 1357 18     Temp 03/04/17 1357 98 F (36.7 C)     Temp Source 03/04/17 1357 Oral     SpO2 03/04/17 1357 100 %     Weight 03/04/17 1358 220 lb (  99.8 kg)     Height 03/04/17 1358 6' (1.829 m)     Head Circumference --      Peak Flow --      Pain Score --      Pain Loc --      Pain Edu? --      Excl. in [place]? --     Constitutional: Alert and oriented. Well appearing and in no distress. Eyes: Normal exam ENT   Head: Normocephalic and atraumatic.   Mouth/Throat: Mucous membranes are moist. Cardiovascular: Normal rate, regular rhythm around 100 bpm  with no murmur. Respiratory: Normal respiratory effort without tachypnea nor retractions. Breath sounds are clear  Gastrointestinal: Soft and nontender. No distention.  Musculoskeletal: Nontender with normal range of motion in all extremities. No lower extremity tenderness Neurologic:  Normal speech and language. No gross focal neurologic deficits Skin:  Skin is warm, dry and intact.  Psychiatric: Mood and affect are normal.   ____________________________________________    EKG  EKG reviewed and interpreted by myself shows sinus tachycardia 133 bpm with a narrow QRS, normal axis, normal intervals besides a QTC prolongation of 550 ms.  No concerning ST changes noted.  ____________________________________________    RADIOLOGY  cxr clear/normal  ____________________________________________   INITIAL IMPRESSION / ASSESSMENT AND PLAN / ED COURSE  Pertinent labs & imaging results that were available during my care of the patient were reviewed by me and considered in my medical decision making (see chart for details).  Patient presents emergency department with continued cough times 2 weeks.  Differential would include upper respiratory infection, acute bronchitis, pneumonia.  We will repeat a chest x-ray and continue to closely monitor in the emergency department.  Upon arrival the patient was tachycardic at 139 bpm.  Currently during my evaluation patient's heart rate is 100 105 bpm.  We will dose IV fluids.  Patient's chest x-ray has resulted largely within normal limits.  Patient's lung sounds are clear but she does have an occasional cough.  Exam more consistent with acute bronchitis.  We will cover with Zithromax, cough medication and IV hydrate while in the emergency department.  Patient agreeable to this plan of care.  Patient continues to appear well, heart rate now under 100.  We will discharge home with PCP follow-up.  ____________________________________________   FINAL  CLINICAL IMPRESSION(S) / ED DIAGNOSES  Acute brochitis   Harvest Dark, MD 03/04/17 1658

## 2017-03-04 NOTE — ED Notes (Signed)
Patient to [name] RN aware.

## 2017-03-04 NOTE — ED Notes (Signed)
First Nurse Note:  Patient states she was seen here last Wed. With similar symptoms and no improvement.  Complaining of cough, dark yellow mucus.  HA and chest pain with coughing.

## 2017-04-22 ENCOUNTER — Emergency Department: Payer: No Typology Code available for payment source

## 2017-04-22 ENCOUNTER — Other Ambulatory Visit: Payer: Self-pay

## 2017-04-22 ENCOUNTER — Emergency Department
Admission: EM | Admit: 2017-04-22 | Discharge: 2017-04-22 | Disposition: A | Discharge status: Home / Self Care | Payer: No Typology Code available for payment source | Attending: Emergency Medicine | Admitting: Emergency Medicine

## 2017-04-22 DIAGNOSIS — M545 Low back pain: Secondary | ICD-10-CM | POA: Diagnosis not present

## 2017-04-22 DIAGNOSIS — Y939 Activity, unspecified: Secondary | ICD-10-CM | POA: Diagnosis not present

## 2017-04-22 DIAGNOSIS — Y92481 Parking lot as the place of occurrence of the external cause: Secondary | ICD-10-CM | POA: Insufficient documentation

## 2017-04-22 DIAGNOSIS — I10 Essential (primary) hypertension: Secondary | ICD-10-CM | POA: Insufficient documentation

## 2017-04-22 DIAGNOSIS — Z79899 Other long term (current) drug therapy: Secondary | ICD-10-CM | POA: Insufficient documentation

## 2017-04-22 DIAGNOSIS — J45909 Unspecified asthma, uncomplicated: Secondary | ICD-10-CM | POA: Diagnosis not present

## 2017-04-22 DIAGNOSIS — Y998 Other external cause status: Secondary | ICD-10-CM | POA: Insufficient documentation

## 2017-04-22 MED ORDER — MELOXICAM 15 MG PO TABS: 15.0000 mg | ORAL_TABLET | Freq: Every day | ORAL | 1 refills | Status: AC

## 2017-04-22 MED ORDER — CYCLOBENZAPRINE HCL 10 MG PO TABS: 10.0000 mg | ORAL_TABLET | Freq: Three times a day (TID) | ORAL | 0 refills | Status: AC | PRN
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
## 2017-07-05 ENCOUNTER — Other Ambulatory Visit: Payer: Self-pay

## 2017-07-05 ENCOUNTER — Encounter: Payer: Self-pay | Admitting: Emergency Medicine

## 2017-07-05 ENCOUNTER — Emergency Department
Admission: EM | Admit: 2017-07-05 | Discharge: 2017-07-05 | Disposition: A | Discharge status: Home / Self Care | Payer: Self-pay | Attending: Emergency Medicine | Admitting: Emergency Medicine

## 2017-07-05 ENCOUNTER — Emergency Department: Payer: Self-pay

## 2017-07-05 DIAGNOSIS — R112 Nausea with vomiting, unspecified: Secondary | ICD-10-CM | POA: Insufficient documentation

## 2017-07-05 DIAGNOSIS — I10 Essential (primary) hypertension: Secondary | ICD-10-CM | POA: Insufficient documentation

## 2017-07-05 DIAGNOSIS — R000 Tachycardia, unspecified: Secondary | ICD-10-CM | POA: Insufficient documentation

## 2017-07-05 DIAGNOSIS — Z79899 Other long term (current) drug therapy: Secondary | ICD-10-CM | POA: Insufficient documentation

## 2017-07-05 DIAGNOSIS — J45909 Unspecified asthma, uncomplicated: Secondary | ICD-10-CM | POA: Insufficient documentation

## 2017-07-05 LAB — CBC
HEMATOCRIT: 30.4 % — AB (ref 35.0–47.0)
HEMOGLOBIN: 10.2 g/dL — AB (ref 12.0–16.0)
MCH: 29.9 pg (ref 26.0–34.0)
MCHC: 33.4 g/dL (ref 32.0–36.0)
MCV: 89.6 fL (ref 80.0–100.0)
Platelets: 120 10*3/uL — ABNORMAL LOW (ref 150–440)
RBC: 3.4 MIL/uL — ABNORMAL LOW (ref 3.80–5.20)
RDW: 16.1 % — ABNORMAL HIGH (ref 11.5–14.5)
WBC: 5.2 10*3/uL (ref 3.6–11.0)

## 2017-07-05 LAB — COMPREHENSIVE METABOLIC PANEL
ALT: 54 U/L (ref 14–54)
ANION GAP: 17 — AB (ref 5–15)
AST: 98 U/L — ABNORMAL HIGH (ref 15–41)
Albumin: 4.9 g/dL (ref 3.5–5.0)
Alkaline Phosphatase: 71 U/L (ref 38–126)
BUN: 16 mg/dL (ref 6–20)
CHLORIDE: 100 mmol/L — AB (ref 101–111)
CO2: 21 mmol/L — AB (ref 22–32)
Calcium: 8.5 mg/dL — ABNORMAL LOW (ref 8.9–10.3)
Creatinine, Ser: 1.22 mg/dL — ABNORMAL HIGH (ref 0.44–1.00)
GFR calc Af Amer: 60 mL/min — ABNORMAL LOW (ref >60)
GFR calc non Af Amer: 52 mL/min — ABNORMAL LOW (ref >60)
GLUCOSE: 104 mg/dL — AB (ref 65–99)
POTASSIUM: 3.7 mmol/L (ref 3.5–5.1)
SODIUM: 138 mmol/L (ref 135–145)
TOTAL PROTEIN: 8.7 g/dL — AB (ref 6.5–8.1)
Total Bilirubin: 2 mg/dL — ABNORMAL HIGH (ref 0.3–1.2)

## 2017-07-05 LAB — URINALYSIS, COMPLETE (UACMP) WITH MICROSCOPIC
BILIRUBIN URINE: NEGATIVE
Glucose, UA: NEGATIVE mg/dL
Ketones, ur: NEGATIVE mg/dL
Leukocytes, UA: NEGATIVE
NITRITE: NEGATIVE
PROTEIN: 100 mg/dL — AB
Specific Gravity, Urine: 1.021 (ref 1.005–1.030)
pH: 5 (ref 5.0–8.0)

## 2017-07-05 LAB — LIPASE, BLOOD: LIPASE: 86 U/L — AB (ref 11–51)

## 2017-07-05 LAB — TROPONIN I

## 2017-07-05 MED ORDER — LORAZEPAM 2 MG/ML IJ SOLN
1.0000 mg | Freq: Once | INTRAMUSCULAR | Status: AC
  Administered 2017-07-05: 1 mg via INTRAVENOUS

## 2017-07-05 MED ORDER — SODIUM CHLORIDE 0.9 % IV BOLUS
1000.0000 mL | Freq: Once | INTRAVENOUS | Status: AC
  Administered 2017-07-05: 1000 mL via INTRAVENOUS

## 2017-07-05 MED ORDER — ONDANSETRON 4 MG PO TBDP: 4.0000 mg | ORAL_TABLET | Freq: Three times a day (TID) | ORAL | 0 refills | Status: DC | PRN

## 2017-07-05 MED ORDER — LORAZEPAM 2 MG/ML IJ SOLN
INTRAMUSCULAR | Status: AC
  Administered 2017-07-05: 1 mg via INTRAVENOUS
  Filled 2017-07-05: qty 1

## 2017-07-05 MED ORDER — ONDANSETRON HCL 4 MG/2ML IJ SOLN
4.0000 mg | Freq: Once | INTRAMUSCULAR | Status: AC | PRN
  Administered 2017-07-05: 4 mg via INTRAVENOUS
  Filled 2017-07-05: qty 2

## 2017-07-05 NOTE — ED Notes (Signed)
Patient reports that she has been constipated since Monday. Patient states that she took a colace and a laxative Friday night and that she has only been able to have small hard stools. Patient states that she stills feel constipated. Patient reports vomiting times two today.

## 2017-07-05 NOTE — ED Triage Notes (Signed)
Pt arrives POV to triage with c/o diarrhea since Saturday. Pt reports that she was constipated on Friday and took several colace and laxatives and now cannot stop having diarrhea with associated emesis at this time. Pt is tachycardic in triage at this time.

## 2017-07-05 NOTE — ED Notes (Signed)
Patient transported to X-ray 

## 2017-07-05 NOTE — ED Provider Notes (Signed)
[facility] Emergency Department Provider Note  Time seen: 10:17 PM  I have reviewed the triage vital signs and the nursing notes.   HISTORY  Chief Complaint Diarrhea and Emesis    HPI Megan Lambert is a 47 y.o. female with a past medical history of asthma, hypertension presents to the emergency department with abdominal discomfort and nausea.  According to the patient her last normal bowel movement was approximately 6 days ago.  She states 3 days ago she took a laxative, since that time has had several loose stools but states they have all been very small.  Patient states today she has been very nauseated and had an episode of emesis.  Denies any fever.  Patient does admit to near daily alcohol use.  Denies any pain in her abdomen she states it feels uneasy.  Patient states she is able to pass gas without issue.  No dysuria.     Past Medical History:  Diagnosis Date  . Asthma   . Hypertension     There are no active problems to display for this patient.   Past Surgical History:  Procedure Laterality Date  . TUBAL LIGATION      Prior to Admission medications   Medication Sig Start Date End Date Taking? Authorizing Provider  albuterol (PROVENTIL HFA;VENTOLIN HFA) 108 (90 Base) MCG/ACT inhaler Inhale 2 puffs into the lungs every 4 (four) hours as needed for wheezing or shortness of breath. 02/25/17   Cuthriell, Charline Bills, PA-C  azithromycin (ZITHROMAX) 250 MG tablet Take 1 tablet (250 mg total) by mouth daily. 03/04/17   Harvest Dark, MD  brompheniramine-pseudoephedrine-DM 30-2-10 MG/5ML syrup Take 10 mLs by mouth 4 (four) times daily as needed. 02/25/17   Cuthriell, Charline Bills, PA-C  guaiFENesin-codeine 100-10 MG/5ML syrup Take 5 mLs by mouth every 6 (six) hours as needed for cough. 03/04/17   Harvest Dark, MD  hydrochlorothiazide (MICROZIDE) 12.5 MG capsule Take 1 capsule (12.5 mg total) by mouth daily. 01/02/17   Harvest Dark, MD   ondansetron (ZOFRAN) 4 MG tablet Take 1 tablet (4 mg total) by mouth every 8 (eight) hours as needed for nausea or vomiting. 01/26/15   Triplett, Johnette Abraham B, FNP  predniSONE (DELTASONE) 50 MG tablet Take 1 tablet (50 mg total) by mouth daily with breakfast. 02/25/17   Cuthriell, Charline Bills, PA-C  sulfamethoxazole-trimethoprim (BACTRIM DS,SEPTRA DS) 800-160 MG tablet Take 1 tablet by mouth 2 (two) times daily. 01/26/15   Victorino Dike, FNP    Allergies  Allergen Reactions  . Lisinopril Swelling    No family history on file.  Social History Social History   Tobacco Use  . Smoking status: Never Smoker  . Smokeless tobacco: Never Used  Substance Use Topics  . Alcohol use: Yes    Comment: occasionally  . Drug use: No    Review of Systems Constitutional: Negative for fever. Eyes: Negative for visual complaints ENT: Negative for recent illness/congestion Cardiovascular: Negative for chest pain. Respiratory: Negative for shortness of breath. Gastrointestinal: Negative for abdominal pain.  Positive for nausea vomiting today.  Occasional loose small stools. Genitourinary: Negative for dysuria Musculoskeletal: Negative for musculoskeletal complaints Skin: Negative for skin complaints  Neurological: Negative for headache All other ROS negative  ____________________________________________   PHYSICAL EXAM:  VITAL SIGNS: ED Triage Vitals  Enc Vitals Group     BP 07/05/17 1928 (!) 196/131     Pulse Rate 07/05/17 1928 (!) 139     Resp 07/05/17 1928 12  Temp 07/05/17 1928 98.8 F (37.1 C)     Temp Source 07/05/17 1928 Oral     SpO2 07/05/17 1928 100 %     Weight 07/05/17 1933 160 lb (72.6 kg)     Height 07/05/17 1933 6' (1.829 m)     Head Circumference --      Peak Flow --      Pain Score 07/05/17 1933 8     Pain Loc --      Pain Edu? --      Excl. in [place]? --    Constitutional: Alert and oriented. Well appearing and in no distress. Eyes: Normal exam ENT   Head:  Normocephalic and atraumatic   Mouth/Throat: Mucous membranes are moist. Cardiovascular: Normal rate, regular rhythm Respiratory: Normal respiratory effort without tachypnea nor retractions. Breath sounds are clear Gastrointestinal: Soft, mildly distended but dull to percussion.  Nontender, no rebound or guarding. Musculoskeletal: Nontender with normal range of motion in all extremities.  Neurologic:  Normal speech and language. No gross focal neurologic deficits Skin:  Skin is warm, dry and intact.  Psychiatric: Mood and affect are normal.  ____________________________________________    EKG  EKG reviewed and interpreted by myself shows sinus tachycardia 133 bpm, narrow QRS, normal axis, largely normal intervals with nonspecific ST changes.  No ST elevation.  ____________________________________________    RADIOLOGY  X-ray negative  ____________________________________________   INITIAL IMPRESSION / ASSESSMENT AND PLAN / ED COURSE  Pertinent labs & imaging results that were available during my care of the patient were reviewed by me and considered in my medical decision making (see chart for details).  Patient presents the emergency department for having an uneasy abdominal feeling, nausea vomiting today and several loose small bowel movements.  Differential would include gastroenteritis, enteritis, colitis or diverticulitis, gallbladder disease or pancreatitis, urinary tract infection.  Overall the patient's labs are largely at her baseline, mild anemia but this is largely unchanged from prior.  Slight lipase elevation, which could indicate a mild pancreatitis although the patient has no epigastric tenderness on my exam possibly could be experiencing mild ileus.  Patient does appear somewhat dehydrated with an anion gap of 17.  Slight renal insufficiency compared to baseline.  Patient's LFTs are slightly elevated however in reviewing the patient's records have been slightly  elevated in the past as well, the patient has no right upper quadrant tenderness.  Patient is somewhat tachycardic she remains tachycardic at 128 130 bpm.  Is not entirely clear why the patient is tachycardic.  She does admit to daily alcohol use, although it is not clear if the patient is in any sort of alcohol withdrawal.  If the patient does use alcohol more frequently than she admits this could possibly be consistent with alcoholic ketoacidosis given the anion gap.  There is no ketones.  Is not entirely clear the source of the patient's hypertension and tachycardia.  Given the patient's abdominal complaints I recommended obtaining a CT scan of the abdomen/pelvis to further evaluate.  Patient states she does not want to wait in the emergency department for that she needs to get her family home and is asking to leave the emergency department.  I strongly advised against this given the patient's consistent tachycardia around 140 bpm currently despite 2 L of IV fluids.  In private the patient does admit to drinking usually 3 vodka drinks per day occasionally with a 16 ounce beer as well.  Is not drinking since last night.  This very likely  could be alcohol withdrawal related as well.  Patient received Ativan her heart rate is currently around 135 bpm down from 150 but is still elevated.  Discussed with the patient she would have to sign out [facility], she states she wishes to do this and wants to go home so she will come back tomorrow for further work-up.  I discussed with the patient if she comes back unfortunately she would have to start from triage once again for her work-up.  I also discussed at this time I do not have a clear reason for why she is so tachycardic or hypertensive and I would like to get a CT scan of the abdomen to further evaluate and help rule out an infectious process.  Patient understands this but still wishes to be discharged home.  Patient has capacity to make her own medical  decision we will discharge [place].  ____________________________________________   FINAL CLINICAL IMPRESSION(S) / ED DIAGNOSES  Nausea vomiting Hypertension Tachycardia    Harvest Dark, MD 07/05/17 2302

## 2017-07-05 NOTE — Discharge Instructions (Addendum)
As we discussed please drink plenty fluids, take your nausea medication as needed, as written.  Please return to the emergency department as soon as possible to continue your work-up.  Please follow-up with your doctor as soon as possible as well.  If you develop worsening symptoms please return immediately to the emergency department.

## 2017-07-06 ENCOUNTER — Emergency Department
Admission: EM | Admit: 2017-07-06 | Discharge: 2017-07-06 | Discharge status: Against Medical Advice | Payer: Self-pay | Attending: Emergency Medicine | Admitting: Emergency Medicine

## 2017-07-06 ENCOUNTER — Encounter: Payer: Self-pay | Admitting: Emergency Medicine

## 2017-07-06 ENCOUNTER — Other Ambulatory Visit: Payer: Self-pay

## 2017-07-06 ENCOUNTER — Emergency Department
Admission: EM | Admit: 2017-07-06 | Discharge: 2017-07-06 | Disposition: A | Discharge status: Home / Self Care | Payer: Self-pay | Attending: Emergency Medicine | Admitting: Emergency Medicine

## 2017-07-06 DIAGNOSIS — N390 Urinary tract infection, site not specified: Secondary | ICD-10-CM | POA: Insufficient documentation

## 2017-07-06 DIAGNOSIS — J45909 Unspecified asthma, uncomplicated: Secondary | ICD-10-CM | POA: Insufficient documentation

## 2017-07-06 DIAGNOSIS — R197 Diarrhea, unspecified: Secondary | ICD-10-CM | POA: Insufficient documentation

## 2017-07-06 DIAGNOSIS — R109 Unspecified abdominal pain: Secondary | ICD-10-CM | POA: Insufficient documentation

## 2017-07-06 DIAGNOSIS — A419 Sepsis, unspecified organism: Secondary | ICD-10-CM | POA: Insufficient documentation

## 2017-07-06 DIAGNOSIS — R000 Tachycardia, unspecified: Secondary | ICD-10-CM | POA: Insufficient documentation

## 2017-07-06 DIAGNOSIS — F10239 Alcohol dependence with withdrawal, unspecified: Secondary | ICD-10-CM | POA: Insufficient documentation

## 2017-07-06 DIAGNOSIS — A599 Trichomoniasis, unspecified: Secondary | ICD-10-CM | POA: Insufficient documentation

## 2017-07-06 DIAGNOSIS — Z79899 Other long term (current) drug therapy: Secondary | ICD-10-CM | POA: Insufficient documentation

## 2017-07-06 DIAGNOSIS — I10 Essential (primary) hypertension: Secondary | ICD-10-CM | POA: Insufficient documentation

## 2017-07-06 DIAGNOSIS — Z5321 Procedure and treatment not carried out due to patient leaving prior to being seen by health care provider: Secondary | ICD-10-CM | POA: Insufficient documentation

## 2017-07-06 LAB — COMPREHENSIVE METABOLIC PANEL
ALBUMIN: 4.7 g/dL (ref 3.5–5.0)
ALT: 44 U/L (ref 14–54)
ANION GAP: 15 (ref 5–15)
AST: 76 U/L — AB (ref 15–41)
Alkaline Phosphatase: 62 U/L (ref 38–126)
BILIRUBIN TOTAL: 2.1 mg/dL — AB (ref 0.3–1.2)
BUN: 20 mg/dL (ref 6–20)
CHLORIDE: 101 mmol/L (ref 101–111)
CO2: 22 mmol/L (ref 22–32)
Calcium: 8 mg/dL — ABNORMAL LOW (ref 8.9–10.3)
Creatinine, Ser: 1.33 mg/dL — ABNORMAL HIGH (ref 0.44–1.00)
GFR calc Af Amer: 54 mL/min — ABNORMAL LOW (ref >60)
GFR calc non Af Amer: 46 mL/min — ABNORMAL LOW (ref >60)
GLUCOSE: 97 mg/dL (ref 65–99)
POTASSIUM: 3.9 mmol/L (ref 3.5–5.1)
Sodium: 138 mmol/L (ref 135–145)
TOTAL PROTEIN: 8.3 g/dL — AB (ref 6.5–8.1)

## 2017-07-06 LAB — URINALYSIS, COMPLETE (UACMP) WITH MICROSCOPIC
Bilirubin Urine: NEGATIVE
GLUCOSE, UA: NEGATIVE mg/dL
KETONES UR: 5 mg/dL — AB
NITRITE: NEGATIVE
PH: 5 (ref 5.0–8.0)
PROTEIN: 100 mg/dL — AB
Specific Gravity, Urine: 1.014 (ref 1.005–1.030)

## 2017-07-06 LAB — CBC
HEMATOCRIT: 30 % — AB (ref 35.0–47.0)
HEMOGLOBIN: 9.9 g/dL — AB (ref 12.0–16.0)
MCH: 29.8 pg (ref 26.0–34.0)
MCHC: 33 g/dL (ref 32.0–36.0)
MCV: 90.5 fL (ref 80.0–100.0)
Platelets: 124 10*3/uL — ABNORMAL LOW (ref 150–440)
RBC: 3.31 MIL/uL — ABNORMAL LOW (ref 3.80–5.20)
RDW: 16.6 % — AB (ref 11.5–14.5)
WBC: 5.6 10*3/uL (ref 3.6–11.0)

## 2017-07-06 LAB — LIPASE, BLOOD: Lipase: 71 U/L — ABNORMAL HIGH (ref 11–51)

## 2017-07-06 MED ORDER — SODIUM CHLORIDE 0.9 % IV BOLUS
1000.0000 mL | Freq: Once | INTRAVENOUS | Status: AC
  Administered 2017-07-06: 1000 mL via INTRAVENOUS

## 2017-07-06 MED ORDER — CEFTRIAXONE SODIUM 250 MG IJ SOLR
250.0000 mg | Freq: Once | INTRAMUSCULAR | Status: AC
  Administered 2017-07-06: 250 mg via INTRAMUSCULAR
  Filled 2017-07-06: qty 250

## 2017-07-06 MED ORDER — CEPHALEXIN 500 MG PO CAPS: 500.0000 mg | ORAL_CAPSULE | Freq: Three times a day (TID) | ORAL | 0 refills | Status: AC

## 2017-07-06 MED ORDER — DOXYCYCLINE HYCLATE 100 MG PO TABS
100.0000 mg | ORAL_TABLET | Freq: Once | ORAL | Status: AC
  Administered 2017-07-06: 100 mg via ORAL
  Filled 2017-07-06: qty 1

## 2017-07-06 MED ORDER — DOXYCYCLINE HYCLATE 100 MG PO CAPS: 100.0000 mg | ORAL_CAPSULE | Freq: Two times a day (BID) | ORAL | 0 refills | Status: DC

## 2017-07-06 MED ORDER — METRONIDAZOLE 500 MG PO TABS
500.0000 mg | ORAL_TABLET | Freq: Once | ORAL | Status: AC
  Administered 2017-07-06: 500 mg via ORAL
  Filled 2017-07-06: qty 1

## 2017-07-06 MED ORDER — METRONIDAZOLE 500 MG PO TABS: 500.0000 mg | ORAL_TABLET | Freq: Two times a day (BID) | ORAL | 0 refills | Status: AC

## 2017-07-06 MED ORDER — LORAZEPAM 2 MG/ML IJ SOLN
1.0000 mg | Freq: Once | INTRAMUSCULAR | Status: AC
  Administered 2017-07-06: 1 mg via INTRAVENOUS
  Filled 2017-07-06: qty 1

## 2017-07-06 NOTE — ED Provider Notes (Signed)
[facility] Emergency Department Provider Note  ___________________________________________   First MD Initiated Contact with Patient 07/06/17 1517     (approximate)  I have reviewed the triage vital signs and the nursing notes.   HISTORY  Chief Complaint Diarrhea   HPI Megan Lambert is a 47 y.o. female with a history of asthma and hypertension was presented to the emergency department today for reevaluation.  She is denying any abdominal pain, chest pain, shortness of breath, urinary tract complaints as burning or frequency.  She says that she was seen here yesterday for diarrhea and abdominal pain.  At that time Dr. Kerman Passey recommended a CAT scan but she left AMA prior to this.  She also had tachycardia in the 130s to 140s.  She is returning today for further evaluation.  Denies any new complaints.  Says that she has not had any diarrhea since leaving last night.  Patient also says that she has not had anything to drink over the past several days.  Says that her last drink was sometime during the mid week last week.    Past Medical History:  Diagnosis Date  . Asthma   . Hypertension     There are no active problems to display for this patient.   Past Surgical History:  Procedure Laterality Date  . TUBAL LIGATION      Prior to Admission medications   Medication Sig Start Date End Date Taking? Authorizing Provider  albuterol (PROVENTIL HFA;VENTOLIN HFA) 108 (90 Base) MCG/ACT inhaler Inhale 2 puffs into the lungs every 4 (four) hours as needed for wheezing or shortness of breath. 02/25/17   Cuthriell, Charline Bills, PA-C  azithromycin (ZITHROMAX) 250 MG tablet Take 1 tablet (250 mg total) by mouth daily. 03/04/17   Harvest Dark, MD  brompheniramine-pseudoephedrine-DM 30-2-10 MG/5ML syrup Take 10 mLs by mouth 4 (four) times daily as needed. 02/25/17   Cuthriell, Charline Bills, PA-C  guaiFENesin-codeine 100-10 MG/5ML syrup Take 5 mLs by mouth  every 6 (six) hours as needed for cough. 03/04/17   Harvest Dark, MD  hydrochlorothiazide (MICROZIDE) 12.5 MG capsule Take 1 capsule (12.5 mg total) by mouth daily. 01/02/17   Harvest Dark, MD  ondansetron (ZOFRAN ODT) 4 MG disintegrating tablet Take 1 tablet (4 mg total) by mouth every 8 (eight) hours as needed for nausea or vomiting. 07/05/17   Harvest Dark, MD  ondansetron (ZOFRAN) 4 MG tablet Take 1 tablet (4 mg total) by mouth every 8 (eight) hours as needed for nausea or vomiting. 01/26/15   Triplett, Johnette Abraham B, FNP  predniSONE (DELTASONE) 50 MG tablet Take 1 tablet (50 mg total) by mouth daily with breakfast. 02/25/17   Cuthriell, Charline Bills, PA-C  sulfamethoxazole-trimethoprim (BACTRIM DS,SEPTRA DS) 800-160 MG tablet Take 1 tablet by mouth 2 (two) times daily. 01/26/15   Triplett, Johnette Abraham B, FNP    Allergies Lisinopril  No family history on file.  Social History Social History   Tobacco Use  . Smoking status: Never Smoker  . Smokeless tobacco: Never Used  Substance Use Topics  . Alcohol use: Yes    Comment: occasionally  . Drug use: No    Review of Systems  Constitutional: No fever/chills Eyes: No visual changes. ENT: No sore throat. Cardiovascular: Denies chest pain. Respiratory: Denies shortness of breath. Gastrointestinal: No abdominal pain.  No nausea, no vomiting.  No diarrhea.  No constipation. Genitourinary: Negative for dysuria. Musculoskeletal: Negative for back pain. Skin: Negative for rash. Neurological: Negative for headaches, focal weakness  or numbness.   ____________________________________________   PHYSICAL EXAM:  VITAL SIGNS: ED Triage Vitals  Enc Vitals Group     BP 07/06/17 1348 (!) 180/133     Pulse Rate 07/06/17 1348 (!) 143     Resp 07/06/17 1348 20     Temp 07/06/17 1348 98.9 F (37.2 C)     Temp Source 07/06/17 1348 Oral     SpO2 07/06/17 1348 100 %     Weight 07/06/17 1350 160 lb (72.6 kg)     Height 07/06/17 1350 6'  (1.829 m)     Head Circumference --      Peak Flow --      Pain Score 07/06/17 1350 0     Pain Loc --      Pain Edu? --      Excl. in [place]? --     Constitutional: Alert and oriented. Well appearing and in no acute distress. Eyes: Conjunctivae are normal.  Head: Atraumatic. Nose: No congestion/rhinnorhea. Mouth/Throat: Mucous membranes are moist.  Neck: No stridor.   Cardiovascular: Tachycardic, regular rhythm. Grossly normal heart sounds.   Respiratory: Normal respiratory effort.  No retractions. Lungs CTAB. Gastrointestinal: Soft and nontender. No distention. No CVA tenderness. Musculoskeletal: No lower extremity tenderness nor edema.  No joint effusions. Neurologic:  Normal speech and language. No gross focal neurologic deficits are appreciated.  No tremulousness. Skin:  Skin is warm, dry and intact. No rash noted. Psychiatric: Mood and affect are normal. Speech and behavior are normal.  ____________________________________________   LABS (all labs ordered are listed, but only abnormal results are displayed)  Labs Reviewed  LIPASE, BLOOD - Abnormal; Notable for the following components:      Result Value   Lipase 71 (*)    All other components within normal limits  COMPREHENSIVE METABOLIC PANEL - Abnormal; Notable for the following components:   Creatinine, Ser 1.33 (*)    Calcium 8.0 (*)    Total Protein 8.3 (*)    AST 76 (*)    Total Bilirubin 2.1 (*)    GFR calc non Af Amer 46 (*)    GFR calc Af Amer 54 (*)    All other components within normal limits  CBC - Abnormal; Notable for the following components:   RBC 3.31 (*)    Hemoglobin 9.9 (*)    HCT 30.0 (*)    RDW 16.6 (*)    Platelets 124 (*)    All other components within normal limits  URINALYSIS, COMPLETE (UACMP) WITH MICROSCOPIC - Abnormal; Notable for the following components:   Color, Urine AMBER (*)    APPearance HAZY (*)    Hgb urine dipstick SMALL (*)    Ketones, ur 5 (*)    Protein, ur 100 (*)     Leukocytes, UA MODERATE (*)    Bacteria, UA MANY (*)    Trichomonas, UA PRESENT (*)    All other components within normal limits  URINE CULTURE   ____________________________________________  EKG   ____________________________________________  RADIOLOGY   ____________________________________________   PROCEDURES  Procedure(s) performed:   Procedures  Critical Care performed:   ____________________________________________   INITIAL IMPRESSION / ASSESSMENT AND PLAN / ED COURSE  Pertinent labs & imaging results that were available during my care of the patient were reviewed by me and considered in my medical decision making (see chart for details).  DDX: Sepsis, UTI, viral syndrome, tachycardia, alcohol withdrawal, As part of my medical decision making, I reviewed the following data within  the electronic MEDICAL RECORD NUMBER Notes from prior ED visits ----------------------------------------- 6:21 PM on 07/06/2017 -----------------------------------------   Patient found evidence of UTI with trichomonas in the urine.  Given IV fluids as well as ceftriaxone, doxycycline and Flagyl.  Despite these medications the patient's heart rate is still in the 130s.  She is requesting to leave at this time.  I told her that I concern that she needs to be admitted again and that this may be a life-threatening condition.  She says that she would like to go home because she does not "feel like being admitted."  She says that she will also "go to [facility]" to "see what they think."  The patient is clinically sober.  She is not tremulous.  She is aware that this could be a deadly or permanently disabling condition.  She understands the ramifications of her decisions and has good insight into her disease process.  She does have clinical capacity at this time.  She will be discharged AGAINST MEDICAL ADVICE. ____________________________________________   FINAL CLINICAL IMPRESSION(S) / ED  DIAGNOSES  Tachycardia.  Sepsis.  Trichomonas.  UTI.    NEW MEDICATIONS STARTED DURING THIS VISIT:  New Prescriptions   No medications on file     Note:  This document was prepared using Dragon voice recognition software and may include unintentional dictation errors.     Orbie Pyo, MD 07/06/17 [facility]

## 2017-07-06 NOTE — ED Triage Notes (Signed)
Patient was triaged earlier and left prior to protocols. Now returns and states went home to get paperwork that states return to ER to complete workup. States left AMA prior to completing visit yesterday.

## 2017-07-06 NOTE — ED Notes (Signed)
Pt presents today for a follow up MRI as per d/c instructions last night. Pt has family at bedside, A/O. D/C instruction states to call [facility], pt states she has not done so at this time.   Pt denies diarrhea N/V. Pt is has no pain at this time.

## 2017-07-06 NOTE — ED Triage Notes (Signed)
Patient states she was seen here yesterday and that it is her understanding that she was to return for MRI. MRI department checked and is not scheduled for MRI. States she was instructed to come to the ED and check back in today. Noted hypertensive and tachycardic in ED. States had IV hydration yesterday.

## 2017-07-06 NOTE — ED Triage Notes (Signed)
During triage and prior to protocols being drawn patient states she needed to use the bathroom and left the triage room. Patient has not returned to triage, is not in any or the adjacent bathrooms, and does not answer when called. Appears to have left without being seen.

## 2017-07-07 LAB — URINE CULTURE

## 2017-08-28 DIAGNOSIS — F1011 Alcohol abuse, in remission: Secondary | ICD-10-CM | POA: Insufficient documentation

## 2017-08-28 DIAGNOSIS — I739 Peripheral vascular disease, unspecified: Secondary | ICD-10-CM | POA: Insufficient documentation

## 2018-02-13 ENCOUNTER — Other Ambulatory Visit: Payer: Self-pay

## 2018-02-13 ENCOUNTER — Emergency Department
Admission: EM | Admit: 2018-02-13 | Discharge: 2018-02-13 | Disposition: A | Discharge status: Home / Self Care | Payer: Self-pay | Attending: Emergency Medicine | Admitting: Emergency Medicine

## 2018-02-13 ENCOUNTER — Encounter: Payer: Self-pay | Admitting: Emergency Medicine

## 2018-02-13 ENCOUNTER — Emergency Department: Payer: Self-pay

## 2018-02-13 DIAGNOSIS — Z79899 Other long term (current) drug therapy: Secondary | ICD-10-CM | POA: Insufficient documentation

## 2018-02-13 DIAGNOSIS — J45909 Unspecified asthma, uncomplicated: Secondary | ICD-10-CM | POA: Insufficient documentation

## 2018-02-13 DIAGNOSIS — D649 Anemia, unspecified: Secondary | ICD-10-CM | POA: Insufficient documentation

## 2018-02-13 DIAGNOSIS — I10 Essential (primary) hypertension: Secondary | ICD-10-CM | POA: Insufficient documentation

## 2018-02-13 DIAGNOSIS — K5900 Constipation, unspecified: Secondary | ICD-10-CM | POA: Insufficient documentation

## 2018-02-13 LAB — CBC WITH DIFFERENTIAL/PLATELET
Abs Immature Granulocytes: 0.03 10*3/uL (ref 0.00–0.07)
Basophils Absolute: 0 10*3/uL (ref 0.0–0.1)
Basophils Relative: 1 %
EOS ABS: 0.1 10*3/uL (ref 0.0–0.5)
EOS PCT: 2 %
HEMATOCRIT: 23.8 % — AB (ref 36.0–46.0)
Hemoglobin: 7.7 g/dL — ABNORMAL LOW (ref 12.0–15.0)
IMMATURE GRANULOCYTES: 1 %
LYMPHS ABS: 1.3 10*3/uL (ref 0.7–4.0)
Lymphocytes Relative: 34 %
MCH: 28.3 pg (ref 26.0–34.0)
MCHC: 32.4 g/dL (ref 30.0–36.0)
MCV: 87.5 fL (ref 80.0–100.0)
MONOS PCT: 8 %
Monocytes Absolute: 0.3 10*3/uL (ref 0.1–1.0)
Neutro Abs: 2.1 10*3/uL (ref 1.7–7.7)
Neutrophils Relative %: 54 %
PLATELETS: 173 10*3/uL (ref 150–400)
RBC: 2.72 MIL/uL — ABNORMAL LOW (ref 3.87–5.11)
RDW: 15.6 % — ABNORMAL HIGH (ref 11.5–15.5)
WBC: 3.9 10*3/uL — ABNORMAL LOW (ref 4.0–10.5)
nRBC: 0 % (ref 0.0–0.2)

## 2018-02-13 LAB — TROPONIN I: Troponin I: <0.03 ng/mL (ref <0.03)

## 2018-02-13 LAB — COMPREHENSIVE METABOLIC PANEL
ALK PHOS: 80 U/L (ref 38–126)
ALT: 26 U/L (ref 0–44)
AST: 59 U/L — AB (ref 15–41)
Albumin: 4.4 g/dL (ref 3.5–5.0)
Anion gap: 13 (ref 5–15)
BUN: 23 mg/dL — AB (ref 6–20)
CHLORIDE: 104 mmol/L (ref 98–111)
CO2: 22 mmol/L (ref 22–32)
CREATININE: 1.15 mg/dL — AB (ref 0.44–1.00)
Calcium: 7.7 mg/dL — ABNORMAL LOW (ref 8.9–10.3)
GFR calc Af Amer: >60 mL/min (ref >60)
GFR calc non Af Amer: 56 mL/min — ABNORMAL LOW (ref >60)
Glucose, Bld: 100 mg/dL — ABNORMAL HIGH (ref 70–99)
Potassium: 3.6 mmol/L (ref 3.5–5.1)
SODIUM: 139 mmol/L (ref 135–145)
Total Bilirubin: 0.6 mg/dL (ref 0.3–1.2)
Total Protein: 7.6 g/dL (ref 6.5–8.1)

## 2018-02-13 LAB — IRON AND TIBC
Iron: 104 ug/dL (ref 28–170)
Saturation Ratios: 30 % (ref 10.4–31.8)
TIBC: 342 ug/dL (ref 250–450)
UIBC: 238 ug/dL

## 2018-02-13 LAB — LACTATE DEHYDROGENASE: LDH: 274 U/L — ABNORMAL HIGH (ref 98–192)

## 2018-02-13 MED ORDER — DOCUSATE SODIUM 100 MG PO CAPS
100.0000 mg | ORAL_CAPSULE | Freq: Once | ORAL | Status: AC
  Administered 2018-02-13: 100 mg via ORAL
  Filled 2018-02-13: qty 1

## 2018-02-13 MED ORDER — SENNA 8.6 MG PO TABS
1.0000 | ORAL_TABLET | Freq: Once | ORAL | Status: AC
  Administered 2018-02-13: 8.6 mg via ORAL
  Filled 2018-02-13: qty 1

## 2018-02-13 MED ORDER — MAGNESIUM HYDROXIDE 400 MG/5ML PO SUSP
30.0000 mL | Freq: Once | ORAL | Status: AC
  Administered 2018-02-13: 30 mL via ORAL
  Filled 2018-02-13: qty 30

## 2018-02-13 MED ORDER — IRON 325 (65 FE) MG PO TABS: 1.0000 | ORAL_TABLET | Freq: Every day | ORAL | 1 refills | Status: DC

## 2018-02-13 MED ORDER — SODIUM CHLORIDE 0.9 % IV BOLUS
1000.0000 mL | Freq: Once | INTRAVENOUS | Status: AC
  Administered 2018-02-13: 1000 mL via INTRAVENOUS

## 2018-02-13 NOTE — ED Triage Notes (Signed)
C/O heartburn and constipation x 2 weeks.  Last BM 1/9.  States BM was hard.  States had hot dogs last night for dinner, and had heartburn this morning.  Took antacids last night.

## 2018-02-13 NOTE — ED Provider Notes (Signed)
[facility] Emergency Department Provider Note  ____________________________________________  Time seen: Approximately 11:43 AM  I have reviewed the triage vital signs and the nursing notes.   HISTORY  Chief Complaint Constipation   HPI Megan Lambert is a 48 y.o. female with a history of asthma hypertension who presents for evaluation of constipation.  Patient reports being constipated since Christmas.  She reports eating different foods during the holiday season which she believes attributed to her symptoms.  She reports having 2 bowel movements since then with the last one 2 days ago.  She feels bloated.  She is passing gas, no nausea or vomiting, no abdominal pain.  No prior history of SBO.  Patient has had a tubal ligation but no other abdominal surgeries.  This morning she reports having a [employer] burrito for breakfast and after that developed heartburn which she describes as burning sensation in her chest.  She reports that after belching her symptoms went away.  She denies any chest pain at this time.  She reports taking 1 dose of MiraLAX a week ago with no significant relief.  Has not tried anything else at home.  No personal or family history of colon cancer.  No history of smoking.  Past Medical History:  Diagnosis Date  . Asthma   . Hypertension     There are no active problems to display for this patient.   Past Surgical History:  Procedure Laterality Date  . TUBAL LIGATION      Prior to Admission medications   Medication Sig Start Date End Date Taking? Authorizing Provider  albuterol (PROVENTIL HFA;VENTOLIN HFA) 108 (90 Base) MCG/ACT inhaler Inhale 2 puffs into the lungs every 4 (four) hours as needed for wheezing or shortness of breath. 02/25/17   Cuthriell, Charline Bills, PA-C  azithromycin (ZITHROMAX) 250 MG tablet Take 1 tablet (250 mg total) by mouth daily. 03/04/17   Harvest Dark, MD  brompheniramine-pseudoephedrine-DM 30-2-10  MG/5ML syrup Take 10 mLs by mouth 4 (four) times daily as needed. 02/25/17   Cuthriell, Charline Bills, PA-C  doxycycline (VIBRAMYCIN) 100 MG capsule Take 1 capsule (100 mg total) by mouth 2 (two) times daily. 07/06/17   Schaevitz, Randall An, MD  Ferrous Sulfate (IRON) 325 (65 Fe) MG TABS Take 1 tablet (325 mg total) by mouth daily. 02/13/18   Rudene Re, MD  guaiFENesin-codeine 100-10 MG/5ML syrup Take 5 mLs by mouth every 6 (six) hours as needed for cough. 03/04/17   Harvest Dark, MD  hydrochlorothiazide (MICROZIDE) 12.5 MG capsule Take 1 capsule (12.5 mg total) by mouth daily. 01/02/17   Harvest Dark, MD  ondansetron (ZOFRAN ODT) 4 MG disintegrating tablet Take 1 tablet (4 mg total) by mouth every 8 (eight) hours as needed for nausea or vomiting. 07/05/17   Harvest Dark, MD  ondansetron (ZOFRAN) 4 MG tablet Take 1 tablet (4 mg total) by mouth every 8 (eight) hours as needed for nausea or vomiting. 01/26/15   Triplett, Johnette Abraham B, FNP  predniSONE (DELTASONE) 50 MG tablet Take 1 tablet (50 mg total) by mouth daily with breakfast. 02/25/17   Cuthriell, Charline Bills, PA-C  sulfamethoxazole-trimethoprim (BACTRIM DS,SEPTRA DS) 800-160 MG tablet Take 1 tablet by mouth 2 (two) times daily. 01/26/15   Triplett, Johnette Abraham B, FNP    Allergies Lisinopril  No family history on file.  Social History Social History   Tobacco Use  . Smoking status: Never Smoker  . Smokeless tobacco: Never Used  Substance Use Topics  . Alcohol use:  Yes    Comment: occasionally  . Drug use: No    Review of Systems  Constitutional: Negative for fever. Eyes: Negative for visual changes. ENT: Negative for sore throat. Neck: No neck pain  Cardiovascular: Negative for chest pain. Respiratory: Negative for shortness of breath. Gastrointestinal: Negative for abdominal pain, vomiting or diarrhea. + constipation Genitourinary: Negative for dysuria. Musculoskeletal: Negative for back pain. Skin: Negative for  rash. Neurological: Negative for headaches, weakness or numbness. Psych: No SI or HI  ____________________________________________   PHYSICAL EXAM:  VITAL SIGNS: ED Triage Vitals  Enc Vitals Group     BP 02/13/18 1003 123/83     Pulse Rate 02/13/18 1003 (!) 108     Resp 02/13/18 1003 16     Temp 02/13/18 1003 98.2 F (36.8 C)     Temp Source 02/13/18 1003 Oral     SpO2 02/13/18 1003 97 %     Weight 02/13/18 1002 160 lb 0.9 oz (72.6 kg)     Height 02/13/18 1001 6' (1.829 m)     Head Circumference --      Peak Flow --      Pain Score 02/13/18 1001 0     Pain Loc --      Pain Edu? --      Excl. in [place]? --     Constitutional: Alert and oriented. Well appearing and in no apparent distress. HEENT:      Head: Normocephalic and atraumatic.         Eyes: Conjunctivae are normal. Sclera is non-icteric.       Mouth/Throat: Mucous membranes are moist.       Neck: Supple with no signs of meningismus. Cardiovascular: Regular rate and rhythm. No murmurs, gallops, or rubs. 2+ symmetrical distal pulses are present in all extremities. No JVD. Respiratory: Normal respiratory effort. Lungs are clear to auscultation bilaterally. No wheezes, crackles, or rhonchi.  Gastrointestinal: Obese, non tender, and non distended with positive bowel sounds. No rebound or guarding. Musculoskeletal: Nontender with normal range of motion in all extremities. No edema, cyanosis, or erythema of extremities. Neurologic: Normal speech and language. Face is symmetric. Moving all extremities. No gross focal neurologic deficits are appreciated. Skin: Skin is warm, dry and intact. No rash noted. Psychiatric: Mood and affect are normal. Speech and behavior are normal.  ____________________________________________   LABS (all labs ordered are listed, but only abnormal results are displayed)  Labs Reviewed  CBC WITH DIFFERENTIAL/PLATELET - Abnormal; Notable for the following components:      Result Value   WBC 3.9  (*)    RBC 2.72 (*)    Hemoglobin 7.7 (*)    HCT 23.8 (*)    RDW 15.6 (*)    All other components within normal limits  COMPREHENSIVE METABOLIC PANEL - Abnormal; Notable for the following components:   Glucose, Bld 100 (*)    BUN 23 (*)    Creatinine, Ser 1.15 (*)    Calcium 7.7 (*)    AST 59 (*)    GFR calc non Af Amer 56 (*)    All other components within normal limits  LACTATE DEHYDROGENASE - Abnormal; Notable for the following components:   LDH 274 (*)    All other components within normal limits  TROPONIN I  IRON AND TIBC   ____________________________________________  EKG  ED ECG REPORT I, Rudene Re, the attending physician, personally viewed and interpreted this ECG.  Sinus tachycardia, rate of 105, normal intervals, normal axis, no ST  elevations or depressions, Q waves in inferior leads and diffuse T wave flattening.  Unchanged from prior ____________________________________________  RADIOLOGY  I have personally reviewed the images performed during this visit and I agree with the Radiologist's read.   Interpretation by Radiologist:  Dg Chest 2 View  Result Date: 02/13/2018 CLINICAL DATA:  Nausea. EXAM: CHEST - 2 VIEW COMPARISON:  Radiographs of July 05, 2017. FINDINGS: The heart size and mediastinal contours are within normal limits. Both lungs are clear. The visualized skeletal structures are unremarkable. IMPRESSION: No active cardiopulmonary disease. Electronically Signed   By: Marijo Conception, M.D.   On: 02/13/2018 10:42   Dg Abdomen 1 View  Result Date: 02/13/2018 CLINICAL DATA:  Nausea, constipation. EXAM: ABDOMEN - 1 VIEW COMPARISON:  Radiographs of July 05, 2017. FINDINGS: No abnormal bowel dilatation is noted. Phleboliths are noted in the pelvis. Calcified uterine fibroid is noted in the pelvis. Large amount of stool seen throughout the colon. IMPRESSION: Large stool burden is noted.  No abnormal bowel dilatation is noted. Electronically Signed   By:  Marijo Conception, M.D.   On: 02/13/2018 10:41     ____________________________________________   PROCEDURES  Procedure(s) performed: None Procedures Critical Care performed:  None ____________________________________________   INITIAL IMPRESSION / ASSESSMENT AND PLAN / ED COURSE   48 y.o. female with a history of asthma hypertension who presents for evaluation of constipation for several weeks. Has only tried one dose of Miralax.  Ddx: constipation, malignancy, SBO  Patient is well-appearing no distress, obese abdomen with no tenderness and positive bowel sounds throughout.  KUB showing large stool burden mostly in ascending and transverse colon.  No fecal impaction therefore do not believe enema will help with.  Will check basic labs.  Will give IV fluids.  Will start patient on milk of magnesia, senna and Colace.  She was also complaining of heartburn in the setting of eating a large burrito this morning that resolved after belching.  EKG was done which shows no ischemic changes.  Troponin is pending.  Chest x-ray is within normal limits.    _________________________ 1:26 PM on 02/13/2018 ----------------------------------------- Labs showing borderline leukopenia which is present in the past with normal differential.  Hemoglobin is 7.7, patient's baseline is 9.3-10.2.  No active bleeding.  Patient is asymptomatic and hemodynamically stable.  Patient's mother has had a history of anemia since childhood.  Patient has been on and off iron in the past for anemia but was taken off of it several years ago by her primary care doctor.  She no longer has menstrual periods.  She denies melena, hematemesis, hematochezia, coffee-ground emesis.  No evidence of fast active GI bleed with constipation for 3 weeks.  Discussed with Dr. Rogue Bussing from hematology who requested iron studies and LDH to be sent and he will follow-up with patient in the beginning of the week for further evaluation.  In the  meantime he agrees with sending patient home on iron supplementation although her MCV is within normal limits.  Discussed signs and symptoms of symptomatic anemia with patient and recommended return to the emergency room if these develop.  Dr. Rogue Bussing agrees with no transfusion at this time. Patient had a bowel movement in the emergency room.  Will discharge home on milk of magnesia, Colace and senna.    As part of my medical decision making, I reviewed the following data within the [place] notes reviewed and incorporated, Labs reviewed , Old chart reviewed, Radiograph  reviewed , A consult was requested and obtained from this/these consultant(s) Hematology, Notes from prior ED visits and [place] Controlled Substance Database    Pertinent labs & imaging results that were available during my care of the patient were reviewed by me and considered in my medical decision making (see chart for details).    ____________________________________________   FINAL CLINICAL IMPRESSION(S) / ED DIAGNOSES  Final diagnoses:  Constipation, unspecified constipation type  Anemia, unspecified type      NEW MEDICATIONS STARTED DURING THIS VISIT:  ED Discharge Orders         Ordered    Ferrous Sulfate (IRON) 325 (65 Fe) MG TABS  Daily     02/13/18 1329           Note:  This document was prepared using Dragon voice recognition software and may include unintentional dictation errors.    Alfred Levins, [place], MD 02/13/18 1330

## 2018-02-13 NOTE — ED Notes (Signed)
Patient denies chest pain and is resting comfortably.

## 2018-02-13 NOTE — ED Notes (Signed)
Patient transported to X-ray 

## 2018-02-13 NOTE — ED Notes (Signed)
Pt verbalized understanding of discharge instructions. NAD at this time. 

## 2018-02-13 NOTE — Discharge Instructions (Signed)
Constipation: Take colace twice a day everyday. Take senna once a day at bedtime. Take daily probiotics. Drink plenty of fluids and eat a diet rich in fiber. Take 65ml of milk of magnesia at bedtime for the next 5 days.   Return to the emergency room if you develop abdominal pain, nausea, vomiting, fever, dizziness, chest pain or shortness of breath.

## 2018-02-13 NOTE — ED Notes (Signed)
Pt states postmenopausal. Stat hCG discontinued per MD Alfred Levins.

## 2018-02-17 ENCOUNTER — Inpatient Hospital Stay: Payer: Self-pay | Attending: Internal Medicine | Admitting: Internal Medicine

## 2018-03-29 ENCOUNTER — Emergency Department: Payer: BLUE CROSS/BLUE SHIELD

## 2018-03-29 ENCOUNTER — Encounter: Payer: Self-pay | Admitting: Emergency Medicine

## 2018-03-29 ENCOUNTER — Other Ambulatory Visit: Payer: Self-pay

## 2018-03-29 ENCOUNTER — Inpatient Hospital Stay
Admission: EM | Admit: 2018-03-29 | Discharge: 2018-03-30 | DRG: 305 | Discharge status: Against Medical Advice | Payer: BLUE CROSS/BLUE SHIELD | Attending: Internal Medicine | Admitting: Internal Medicine

## 2018-03-29 DIAGNOSIS — Z9119 Patient's noncompliance with other medical treatment and regimen: Secondary | ICD-10-CM

## 2018-03-29 DIAGNOSIS — F10239 Alcohol dependence with withdrawal, unspecified: Secondary | ICD-10-CM | POA: Diagnosis not present

## 2018-03-29 DIAGNOSIS — J45909 Unspecified asthma, uncomplicated: Secondary | ICD-10-CM | POA: Diagnosis present

## 2018-03-29 DIAGNOSIS — F1010 Alcohol abuse, uncomplicated: Secondary | ICD-10-CM | POA: Diagnosis present

## 2018-03-29 DIAGNOSIS — D638 Anemia in other chronic diseases classified elsewhere: Secondary | ICD-10-CM | POA: Diagnosis present

## 2018-03-29 DIAGNOSIS — I8390 Asymptomatic varicose veins of unspecified lower extremity: Secondary | ICD-10-CM | POA: Diagnosis present

## 2018-03-29 DIAGNOSIS — Z5329 Procedure and treatment not carried out because of patient's decision for other reasons: Secondary | ICD-10-CM | POA: Diagnosis present

## 2018-03-29 DIAGNOSIS — Z7952 Long term (current) use of systemic steroids: Secondary | ICD-10-CM

## 2018-03-29 DIAGNOSIS — I161 Hypertensive emergency: Secondary | ICD-10-CM | POA: Diagnosis not present

## 2018-03-29 DIAGNOSIS — N179 Acute kidney failure, unspecified: Secondary | ICD-10-CM | POA: Diagnosis present

## 2018-03-29 DIAGNOSIS — I10 Essential (primary) hypertension: Secondary | ICD-10-CM | POA: Diagnosis present

## 2018-03-29 DIAGNOSIS — Z888 Allergy status to other drugs, medicaments and biological substances status: Secondary | ICD-10-CM | POA: Diagnosis not present

## 2018-03-29 DIAGNOSIS — F10939 Alcohol use, unspecified with withdrawal, unspecified: Secondary | ICD-10-CM

## 2018-03-29 DIAGNOSIS — Z79899 Other long term (current) drug therapy: Secondary | ICD-10-CM | POA: Diagnosis not present

## 2018-03-29 DIAGNOSIS — I160 Hypertensive urgency: Secondary | ICD-10-CM | POA: Diagnosis not present

## 2018-03-29 LAB — TROPONIN I: Troponin I: <0.03 ng/mL (ref <0.03)

## 2018-03-29 LAB — BASIC METABOLIC PANEL
Anion gap: 16 — ABNORMAL HIGH (ref 5–15)
BUN: 22 mg/dL — ABNORMAL HIGH (ref 6–20)
CALCIUM: 7.6 mg/dL — AB (ref 8.9–10.3)
CO2: 21 mmol/L — ABNORMAL LOW (ref 22–32)
CREATININE: 1.67 mg/dL — AB (ref 0.44–1.00)
Chloride: 102 mmol/L (ref 98–111)
GFR calc Af Amer: 41 mL/min — ABNORMAL LOW (ref >60)
GFR, EST NON AFRICAN AMERICAN: 36 mL/min — AB (ref >60)
Glucose, Bld: 96 mg/dL (ref 70–99)
Potassium: 3.9 mmol/L (ref 3.5–5.1)
Sodium: 139 mmol/L (ref 135–145)

## 2018-03-29 LAB — URINALYSIS, COMPLETE (UACMP) WITH MICROSCOPIC
Bacteria, UA: NONE SEEN
Bilirubin Urine: NEGATIVE
Glucose, UA: NEGATIVE mg/dL
Ketones, ur: NEGATIVE mg/dL
Leukocytes,Ua: NEGATIVE
Nitrite: NEGATIVE
Protein, ur: 100 mg/dL — AB
Specific Gravity, Urine: 1.01 (ref 1.005–1.030)
pH: 7 (ref 5.0–8.0)

## 2018-03-29 LAB — CBC
HCT: 28.7 % — ABNORMAL LOW (ref 36.0–46.0)
Hemoglobin: 9.6 g/dL — ABNORMAL LOW (ref 12.0–15.0)
MCH: 28 pg (ref 26.0–34.0)
MCHC: 33.4 g/dL (ref 30.0–36.0)
MCV: 83.7 fL (ref 80.0–100.0)
PLATELETS: 161 10*3/uL (ref 150–400)
RBC: 3.43 MIL/uL — ABNORMAL LOW (ref 3.87–5.11)
RDW: 15.5 % (ref 11.5–15.5)
WBC: 4.4 10*3/uL (ref 4.0–10.5)
nRBC: 0 % (ref 0.0–0.2)

## 2018-03-29 LAB — URINE DRUG SCREEN, QUALITATIVE (ARMC ONLY)
Amphetamines, Ur Screen: NOT DETECTED
Barbiturates, Ur Screen: NOT DETECTED
Benzodiazepine, Ur Scrn: NOT DETECTED
COCAINE METABOLITE, UR ~~LOC~~: NOT DETECTED
Cannabinoid 50 Ng, Ur ~~LOC~~: NOT DETECTED
MDMA (Ecstasy)Ur Screen: NOT DETECTED
Methadone Scn, Ur: NOT DETECTED
Opiate, Ur Screen: NOT DETECTED
Phencyclidine (PCP) Ur S: NOT DETECTED
Tricyclic, Ur Screen: NOT DETECTED

## 2018-03-29 MED ORDER — SODIUM CHLORIDE 0.9 % IV BOLUS
1000.0000 mL | Freq: Once | INTRAVENOUS | Status: AC
  Administered 2018-03-29: 1000 mL via INTRAVENOUS

## 2018-03-29 MED ORDER — ADULT MULTIVITAMIN W/MINERALS CH
1.0000 | ORAL_TABLET | Freq: Every day | ORAL | Status: DC
  Administered 2018-03-30: 1 via ORAL
  Filled 2018-03-29: qty 1

## 2018-03-29 MED ORDER — LORAZEPAM 1 MG PO TABS
1.0000 mg | ORAL_TABLET | Freq: Four times a day (QID) | ORAL | Status: DC | PRN
  Filled 2018-03-29: qty 1

## 2018-03-29 MED ORDER — LORAZEPAM 2 MG/ML IJ SOLN
INTRAMUSCULAR | Status: AC
  Filled 2018-03-29: qty 1

## 2018-03-29 MED ORDER — POLYETHYLENE GLYCOL 3350 17 G PO PACK: 17.0000 g | PACK | Freq: Every day | ORAL | Status: DC | PRN

## 2018-03-29 MED ORDER — LABETALOL HCL 5 MG/ML IV SOLN
0.5000 mg/min | INTRAVENOUS | Status: DC
  Administered 2018-03-29: 3 mg/min via INTRAVENOUS
  Administered 2018-03-29: 0.5 mg/min via INTRAVENOUS
  Filled 2018-03-29 (×2): qty 100

## 2018-03-29 MED ORDER — ACETAMINOPHEN 325 MG PO TABS: 650.0000 mg | ORAL_TABLET | Freq: Four times a day (QID) | ORAL | Status: DC | PRN

## 2018-03-29 MED ORDER — ACETAMINOPHEN 650 MG RE SUPP: 650.0000 mg | Freq: Four times a day (QID) | RECTAL | Status: DC | PRN

## 2018-03-29 MED ORDER — ONDANSETRON HCL 4 MG PO TABS: 4.0000 mg | ORAL_TABLET | Freq: Four times a day (QID) | ORAL | Status: DC | PRN

## 2018-03-29 MED ORDER — LORAZEPAM 2 MG/ML IJ SOLN: 1.0000 mg | Freq: Four times a day (QID) | INTRAMUSCULAR | Status: DC | PRN

## 2018-03-29 MED ORDER — LORAZEPAM 2 MG/ML IJ SOLN
2.0000 mg | Freq: Once | INTRAMUSCULAR | Status: AC
  Administered 2018-03-29: 2 mg via INTRAVENOUS

## 2018-03-29 MED ORDER — LABETALOL HCL 5 MG/ML IV SOLN
10.0000 mg | Freq: Once | INTRAVENOUS | Status: AC
  Administered 2018-03-29: 10 mg via INTRAVENOUS
  Filled 2018-03-29: qty 4

## 2018-03-29 MED ORDER — FOLIC ACID 1 MG PO TABS
1.0000 mg | ORAL_TABLET | Freq: Every day | ORAL | Status: DC
  Administered 2018-03-30: 1 mg via ORAL
  Filled 2018-03-29: qty 1

## 2018-03-29 MED ORDER — VITAMIN B-1 100 MG PO TABS
100.0000 mg | ORAL_TABLET | Freq: Every day | ORAL | Status: DC
  Administered 2018-03-30: 100 mg via ORAL
  Filled 2018-03-29: qty 1

## 2018-03-29 MED ORDER — AMLODIPINE BESYLATE 5 MG PO TABS
10.0000 mg | ORAL_TABLET | Freq: Once | ORAL | Status: AC
  Administered 2018-03-29: 10 mg via ORAL
  Filled 2018-03-29: qty 2

## 2018-03-29 MED ORDER — ENOXAPARIN SODIUM 40 MG/0.4ML ~~LOC~~ SOLN: 40.0000 mg | SUBCUTANEOUS | Status: DC

## 2018-03-29 MED ORDER — LORAZEPAM 2 MG/ML IJ SOLN
1.0000 mg | Freq: Once | INTRAMUSCULAR | Status: AC
  Administered 2018-03-29: 1 mg via INTRAVENOUS
  Filled 2018-03-29: qty 1

## 2018-03-29 MED ORDER — THIAMINE HCL 100 MG/ML IJ SOLN: 100.0000 mg | Freq: Every day | INTRAMUSCULAR | Status: DC

## 2018-03-29 MED ORDER — LORAZEPAM 2 MG/ML IJ SOLN: 2.0000 mg | Freq: Once | INTRAMUSCULAR | Status: DC

## 2018-03-29 MED ORDER — LORAZEPAM 2 MG/ML IJ SOLN
2.0000 mg | Freq: Once | INTRAMUSCULAR | Status: AC
  Administered 2018-03-29: 2 mg via INTRAVENOUS
  Filled 2018-03-29: qty 1

## 2018-03-29 MED ORDER — ONDANSETRON HCL 4 MG/2ML IJ SOLN: 4.0000 mg | Freq: Four times a day (QID) | INTRAMUSCULAR | Status: DC | PRN

## 2018-03-29 MED ORDER — AMLODIPINE BESYLATE 10 MG PO TABS
10.0000 mg | ORAL_TABLET | Freq: Every day | ORAL | Status: DC
  Administered 2018-03-30: 10 mg via ORAL
  Filled 2018-03-29: qty 1

## 2018-03-29 MED ORDER — LORAZEPAM 2 MG PO TABS: 0.0000 mg | ORAL_TABLET | Freq: Two times a day (BID) | ORAL | Status: DC

## 2018-03-29 MED ORDER — LORAZEPAM 2 MG PO TABS: 0.0000 mg | ORAL_TABLET | Freq: Four times a day (QID) | ORAL | Status: DC

## 2018-03-29 NOTE — ED Triage Notes (Signed)
Onset dizzines 730 am at work.  Says at same time had bilateral numbness in her fingers.  And was hot and cold.  Vomited x 1 when she got home.

## 2018-03-29 NOTE — ED Provider Notes (Addendum)
[facility] Emergency Department Provider Note  ____________________________________________   I have reviewed the triage vital signs and the nursing notes. Where available I have reviewed prior notes and, if possible and indicated, outside hospital notes.    HISTORY  Chief Complaint Dizziness    HPI Megan Lambert is a 48 y.o. female who presents today complaining of "I feel fine now".  Patient states that she was at work, she is been tingling in both hands, felt somewhat anxious, and came in.  Has not taken her blood pressure medication today.  Denies any chest pain shortness of breath nausea vomiting diarrhea abdominal pain, no focal numbness or weakness, no change in vision no difficulty speaking no headache, she has no other complaints except for she felt anxious and some tingling in her fingers.    Past Medical History:  Diagnosis Date  . Asthma   . Hypertension     There are no active problems to display for this patient.   Past Surgical History:  Procedure Laterality Date  . TUBAL LIGATION      Prior to Admission medications   Medication Sig Start Date End Date Taking? Authorizing Provider  albuterol (PROVENTIL HFA;VENTOLIN HFA) 108 (90 Base) MCG/ACT inhaler Inhale 2 puffs into the lungs every 4 (four) hours as needed for wheezing or shortness of breath. 02/25/17   Cuthriell, Charline Bills, PA-C  azithromycin (ZITHROMAX) 250 MG tablet Take 1 tablet (250 mg total) by mouth daily. 03/04/17   Harvest Dark, MD  brompheniramine-pseudoephedrine-DM 30-2-10 MG/5ML syrup Take 10 mLs by mouth 4 (four) times daily as needed. 02/25/17   Cuthriell, Charline Bills, PA-C  doxycycline (VIBRAMYCIN) 100 MG capsule Take 1 capsule (100 mg total) by mouth 2 (two) times daily. 07/06/17   Schaevitz, Randall An, MD  Ferrous Sulfate (IRON) 325 (65 Fe) MG TABS Take 1 tablet (325 mg total) by mouth daily. 02/13/18   Rudene Re, MD  guaiFENesin-codeine 100-10  MG/5ML syrup Take 5 mLs by mouth every 6 (six) hours as needed for cough. 03/04/17   Harvest Dark, MD  hydrochlorothiazide (MICROZIDE) 12.5 MG capsule Take 1 capsule (12.5 mg total) by mouth daily. 01/02/17   Harvest Dark, MD  ondansetron (ZOFRAN ODT) 4 MG disintegrating tablet Take 1 tablet (4 mg total) by mouth every 8 (eight) hours as needed for nausea or vomiting. 07/05/17   Harvest Dark, MD  ondansetron (ZOFRAN) 4 MG tablet Take 1 tablet (4 mg total) by mouth every 8 (eight) hours as needed for nausea or vomiting. 01/26/15   Triplett, Johnette Abraham B, FNP  predniSONE (DELTASONE) 50 MG tablet Take 1 tablet (50 mg total) by mouth daily with breakfast. 02/25/17   Cuthriell, Charline Bills, PA-C  sulfamethoxazole-trimethoprim (BACTRIM DS,SEPTRA DS) 800-160 MG tablet Take 1 tablet by mouth 2 (two) times daily. 01/26/15   Triplett, Johnette Abraham B, FNP    Allergies Lisinopril  No family history on file.  Social History Social History   Tobacco Use  . Smoking status: Never Smoker  . Smokeless tobacco: Never Used  Substance Use Topics  . Alcohol use: Yes    Comment: occasionally  . Drug use: No    Review of Systems Constitutional: No fever/chills Eyes: No visual changes. ENT: No sore throat. No stiff neck no neck pain Cardiovascular: Denies chest pain. Respiratory: Denies shortness of breath. Gastrointestinal:   no vomiting.  No diarrhea.  No constipation. Genitourinary: Negative for dysuria. Musculoskeletal: Negative lower extremity swelling Skin: Negative for rash. Neurological: Negative for severe headaches,  focal weakness or numbness.   ____________________________________________   PHYSICAL EXAM:  VITAL SIGNS: ED Triage Vitals  Enc Vitals Group     BP 03/29/18 1221 (!) 213/139     Pulse Rate 03/29/18 1221 (!) 130     Resp 03/29/18 1630 16     Temp 03/29/18 1221 98.5 F (36.9 C)     Temp Source 03/29/18 1221 Oral     SpO2 03/29/18 1221 100 %     Weight 03/29/18 1224 200  lb (90.7 kg)     Height 03/29/18 1224 6' (1.829 m)     Head Circumference --      Peak Flow --      Pain Score 03/29/18 1224 0     Pain Loc --      Pain Edu? --      Excl. in [place]? --     Constitutional: Alert and oriented. Well appearing and in no acute distress. Eyes: Conjunctivae are normal Head: Atraumatic HEENT: No congestion/rhinnorhea. Mucous membranes are moist.  Oropharynx non-erythematous Neck:   Nontender with no meningismus, no masses, no stridor Cardiovascular: Rate is a elevated regular rhythm. Grossly normal heart sounds.  Good peripheral circulation. Respiratory: Normal respiratory effort.  No retractions. Lungs CTAB. Abdominal: Soft and nontender. No distention. No guarding no rebound Back:  There is no focal tenderness or step off.  there is no midline tenderness there are no lesions noted. there is no CVA tenderness Musculoskeletal: No lower extremity tenderness, no upper extremity tenderness. No joint effusions, no DVT signs strong distal pulses no edema Neurologic:  Normal speech and language. No gross focal neurologic deficits are appreciated.  Skin:  Skin is warm, dry and intact. No rash noted. Psychiatric: Mood and affect are anxious. Speech and behavior are normal.  ____________________________________________   LABS (all labs ordered are listed, but only abnormal results are displayed)  Labs Reviewed  BASIC METABOLIC PANEL - Abnormal; Notable for the following components:      Result Value   CO2 21 (*)    BUN 22 (*)    Creatinine, Ser 1.67 (*)    Calcium 7.6 (*)    GFR calc non Af Amer 36 (*)    GFR calc Af Amer 41 (*)    Anion gap 16 (*)    All other components within normal limits  CBC - Abnormal; Notable for the following components:   RBC 3.43 (*)    Hemoglobin 9.6 (*)    HCT 28.7 (*)    All other components within normal limits  URINALYSIS, COMPLETE (UACMP) WITH MICROSCOPIC - Abnormal; Notable for the following components:   Color, Urine  YELLOW (*)    APPearance CLEAR (*)    Hgb urine dipstick SMALL (*)    Protein, ur 100 (*)    All other components within normal limits  TROPONIN I  URINE DRUG SCREEN, QUALITATIVE ([facility] ONLY)    Pertinent labs  results that were available during my care of the patient were reviewed by me and considered in my medical decision making (see chart for details). ____________________________________________  EKG  I personally interpreted any EKGs ordered by me or triage Heart rate elevated at 135 sinus rhythm, no acute ST elevation or depression aside from tachycardia no acute ischemia or other changes ____________________________________________  RADIOLOGY  Pertinent labs & imaging results that were available during my care of the patient were reviewed by me and considered in my medical decision making (see chart for details). If possible, patient  and/or family made aware of any abnormal findings.  Ct Head Wo Contrast  Result Date: 03/29/2018 CLINICAL DATA:  Dizziness, upper extremity paresthesias EXAM: CT HEAD WITHOUT CONTRAST TECHNIQUE: Contiguous axial images were obtained from the base of the skull through the vertex without intravenous contrast. COMPARISON:  None available FINDINGS: Brain: Slight brain atrophy pattern without acute intracranial hemorrhage, new infarction, midline shift herniation or extra-axial fluid collection. No focal brain edema. Cisterns are patent. Posterior fossa CSF attenuation cystic structure within the midline extending over the left cerebellar hemisphere roughly measuring 7.1 x 2.5 cm more compatible with a chronic benign appearing arachnoid cyst rather than a mega cisterna magna. Vascular: No hyperdense vessel or unexpected calcification. Skull: Normal. Negative for fracture or focal lesion. Sinuses/Orbits: No acute finding. Other: None. IMPRESSION: Slight brain atrophy for the patient's age. No acute intracranial abnormality by noncontrast CT Posterior fossa  benign-appearing arachnoid cyst, as above. Electronically Signed   By: Jerilynn Mages.  Shick M.D.   On: 03/29/2018 16:42   ____________________________________________    PROCEDURES  Procedure(s) performed: None  Procedures  Critical Care performed: None  ____________________________________________   INITIAL IMPRESSION / ASSESSMENT AND PLAN / ED COURSE  Pertinent labs & imaging results that were available during my care of the patient were reviewed by me and considered in my medical decision making (see chart for details).  Patient here with anxiety, and tingling in her hands, blood pressure is quite high.  All this could be mediated only by anxiety however given her age and comorbidities, we will obtain CT scan of the head, chest x-ray and cardiac work-up.  ----------------------------------------- 5:01 PM on 03/29/2018 -----------------------------------------  Patient remains with an NIH stroke scale of 0, we did give her Ativan, creatinine is somewhat elevated, but this is something that is been trending up over the last year, she does have no evidence of urinary tract infection CT scan is negative cardiac enzymes are negative CBC is negative, she has baseline anemia but there does not appear to be any change for that.  We will see if her heart rate trends down.  We have given her pain medications and anxiolytics and we will reassess  ----------------------------------------- 6:10 PM on 03/29/2018 -----------------------------------------  Patient here with hypertension she is more tachycardic than she was before, initially she had downplayed her alcohol but after further discussion it reveals that she takes half to a whole 1/5 of alcohol a day and has not had much since yesterday, and yesterday she elected to have a single beer instead of her usual fifth or half 1/5 of alcohol.  I do suspect this is more likely alcohol withdrawal, we are going to give her advanced Ativan to see if we can  bring down her levels of tachycardia.  She is not markedly tremulous she has no symptoms or complaints by think she will need to be admitted for hypertension secondary to EtOH withdrawal  ----------------------------------------- 7:21 PM on 03/29/2018 -----------------------------------------  None, nothing to suggest PE no chest pain no shortness of breath, and again patient has a long history of EtOH abuse and decided to quit drinking yesterday only having 1 beer instead of her usual fifth of alcohol.  Besides tachycardia and hypertension, no evidence of obvious pathology noted, she did have some tingling CT is negative, I am giving her large doses of benzodiazepines, I have admitted to the hospitalist service they have taken over care. thank you Dr. Genia Harold   ____________________________________________   FINAL CLINICAL IMPRESSION(S) / ED DIAGNOSES  Final diagnoses:  None      This chart was dictated using voice recognition software.  Despite best efforts to proofread,  errors can occur which can change meaning.      Schuyler Amor, MD 03/29/18 1702    Schuyler Amor, MD 03/29/18 1810    Schuyler Amor, MD 03/29/18 Dorthula Perfect

## 2018-03-29 NOTE — ED Notes (Signed)
Has not taken her bp med yet today (amlodipine)

## 2018-03-29 NOTE — ED Notes (Signed)
Phrm called to send next labetalol gtt.

## 2018-03-29 NOTE — ED Notes (Signed)
Charge RN Annie Main confirms to hold report on pt until new bed confirmed.

## 2018-03-29 NOTE — Consult Note (Signed)
Name: Megan Lambert MRN: [id number] DOB: 1970-04-08     CONSULTATION DATE: 03/29/2018  REFERRING MD : mody  CHIEF COMPLAINT: high bloo dpressure  STUDIES:    2.24.2020 CXR independently reviewed by Me WNL CT head 2.24.2020 WNL  HISTORY OF PRESENT ILLNESS:  Patient states that she was at work, she is been tingling in both hands, felt somewhat anxious, and came in.  +diaphoresis Has not taken her blood pressure medication today.  Denies any chest pain shortness of breath nausea vomiting diarrhea abdominal pain, no focal numbness or weakness, no change in vision no difficulty speaking no headache, she has no other complaints except for she felt anxious and some tingling in her fingers. Bp 185/117 Hr 140 +ARF cerat 1.6  CT head NEG  Being admitted to SD for BP control  PAST MEDICAL HISTORY :   has a past medical history of Asthma and Hypertension.  has a past surgical history that includes Tubal ligation. Prior to Admission medications   Medication Sig Start Date End Date Taking? Authorizing Provider  albuterol (PROVENTIL HFA;VENTOLIN HFA) 108 (90 Base) MCG/ACT inhaler Inhale 2 puffs into the lungs every 4 (four) hours as needed for wheezing or shortness of breath. 02/25/17  Yes Cuthriell, Charline Bills, PA-C  amLODipine (NORVASC) 10 MG tablet Take 10 mg by mouth daily.   Yes [provider]  brompheniramine-pseudoephedrine-DM 30-2-10 MG/5ML syrup Take 10 mLs by mouth 4 (four) times daily as needed. 02/25/17  Yes Cuthriell, Charline Bills, PA-C  docusate sodium (COLACE) 100 MG capsule Take 100 mg by mouth 2 (two) times daily.   Yes [provider]  Ferrous Sulfate (IRON) 325 (65 Fe) MG TABS Take 1 tablet (325 mg total) by mouth daily. 02/13/18  Yes Alfred Levins, [place], MD  ondansetron (ZOFRAN ODT) 4 MG disintegrating tablet Take 1 tablet (4 mg total) by mouth every 8 (eight) hours as needed for nausea or vomiting. 07/05/17  Yes Harvest Dark, MD   Allergies    Allergen Reactions  . Lisinopril Swelling    FAMILY HISTORY:  family history is not on file. SOCIAL HISTORY:  reports that she has never smoked. She has never used smokeless tobacco. She reports current alcohol use. She reports that she does not use drugs.  REVIEW OF SYSTEMS:   Constitutional: Negative for fever, chills, weight loss, malaise/fatigue and diaphoresis.  HENT: Negative for hearing loss, ear pain, nosebleeds, congestion, sore throat, neck pain, tinnitus and ear discharge.   Eyes: Negative for blurred vision, double vision, photophobia, pain, discharge and redness.  Respiratory: Negative for cough, hemoptysis, sputum production, shortness of breath, wheezing and stridor.   Cardiovascular: Negative for chest pain, palpitations, orthopnea, claudication, leg swelling and PND.  Gastrointestinal: Negative for heartburn, nausea, vomiting, abdominal pain, diarrhea, constipation, blood in stool and melena.  Genitourinary: Negative for dysuria, urgency, frequency, hematuria and flank pain.  Musculoskeletal: Negative for myalgias, back pain, joint pain and falls.  Skin: Negative for itching and rash.  Neurological: Negative for dizziness, tingling, tremors, sensory change, speech change, focal weakness, seizures, loss of consciousness, weakness and headaches.  Endo/Heme/Allergies: Negative for environmental allergies and polydipsia. Does not bruise/bleed easily.  ALL OTHER ROS ARE NEGATIVE    VITAL SIGNS: Temp:  [98.2 F (36.8 C)-98.5 F (36.9 C)] 98.2 F (36.8 C) (02/24 1810) Pulse Rate:  [120-139] 125 (02/24 1916) Resp:  [11-22] 17 (02/24 1916) BP: (122-213)/(108-143) 122/108 (02/24 1915) SpO2:  [96 %-100 %] 96 % (02/24 1916) Weight:  [90.7 kg] 90.7 kg (  02/24 1224)  Physical Examination:   GENERAL:NAD, no fevers, chills, no weakness no fatigue HEAD: Normocephalic, atraumatic.  EYES: Pupils equal, round, reactive to light. Extraocular muscles intact. No scleral icterus.   MOUTH: Moist mucosal membrane.   EAR, NOSE, THROAT: Clear without exudates. No external lesions.  NECK: Supple. No thyromegaly. No nodules. No JVD.  PULMONARY:CTA B/L no wheezes, no crackles, no rhonchi CARDIOVASCULAR: S1 and S2. Regular rate and rhythm. No murmurs, rubs, or gallops. No edema.  GASTROINTESTINAL: Soft, nontender, nondistended. No masses. Positive bowel sounds.  MUSCULOSKELETAL: No swelling, clubbing, or edema. Range of motion full in all extremities.  NEUROLOGIC: Cranial nerves II through XII are intact. No gross focal neurological deficits.  SKIN: No ulceration, lesions, rashes, or cyanosis. Skin warm and dry. Turgor intact.  PSYCHIATRIC: Mood, affect within normal limits. The patient is awake, alert and oriented x 3. Insight, judgment intact.      ASSESSMENT / PLAN:  HTN emergency from noncompliance Restart home meds Labetelol to be given in ER and infusion to be started BP goal SBP<180 DBP<110 To be admitted to SD  H/o ETOH abuse-no evidence of DT's at this time Watch for Dt's  ELECTROLYTES -follow labs as needed -replace as needed -pharmacy consultation and following   DVT/GI PRX ordered TRANSFUSIONS AS NEEDED MONITOR FSBS ASSESS the need for LABS as needed   Patient/Family are satisfied with Plan of action and management. All questions answered  Corrin Parker, M.D.  [facility]  Medical Director [place] Director [facility] Cardio-Pulmonary Department

## 2018-03-29 NOTE — ED Notes (Signed)
BP checked on R arm as well. Same as left arm. Pt's arm relaxed when BP cuff went off.

## 2018-03-29 NOTE — ED Notes (Signed)
Pt assisted to/from bedside toilet. Unsteady. Pt denies dizziness.

## 2018-03-29 NOTE — ED Notes (Signed)
Told by CCU to hold on report as bed assignment may be changing momentarily.

## 2018-03-29 NOTE — ED Notes (Signed)
Pt has yet to take her amlodipine today.

## 2018-03-29 NOTE — ED Notes (Signed)
Called charge RN Annie Main to determine if pt's step-down bed is ready or not. Charge will call this RN back when bed confirmed.

## 2018-03-29 NOTE — ED Notes (Signed)
Pt repositioned in bed.

## 2018-03-29 NOTE — H&P (Signed)
[place] [facility] [place] NAME: Megan Lambert    MR#:  [id number]  DATE OF BIRTH:  [birth year]  DATE OF ADMISSION:  03/29/2018  PRIMARY CARE PHYSICIAN: [facility] [place]   REQUESTING/REFERRING PHYSICIAN: dr Burlene Arnt  CHIEF COMPLAINT:   numbness HISTORY OF PRESENT ILLNESS:  Megan Lambert  is a 48 y.o. female with a known history of EtOH dependence who presents to the emergency department due to numbness in both of her hands.  Patient reports she was at work when she developed some numbness of both her hands.  She had no other focal neurological deficits.  Should her blood pressure was in the 952W systolic when she arrived to the emergency room.  She takes Norvasc.  She also endorses she drinks EtOH.  She told me she drank 4 shots on most days however she told the ER physician she drinks 1/5 on most days of the week. She no longer has numbness of her hands. Head CT was negative Blood pressure and heart rate remain very elevated.  Patient denies shortness of breath, chest pain, nausea, vomiting, diarrhea. She denies tremulous or dizziness.  PAST MEDICAL HISTORY:   Past Medical History:  Diagnosis Date  . Asthma   . Hypertension     PAST SURGICAL HISTORY:   Past Surgical History:  Procedure Laterality Date  . TUBAL LIGATION      SOCIAL HISTORY:   Social History   Tobacco Use  . Smoking status: Never Smoker  . Smokeless tobacco: Never Used  Substance Use Topics  . Alcohol use: Yes    Comment: occasionally    FAMILY HISTORY:  No family history on file.  DRUG ALLERGIES:   Allergies  Allergen Reactions  . Lisinopril Swelling    REVIEW OF SYSTEMS:   Review of Systems  Constitutional: Negative.  Negative for chills, fever and malaise/fatigue.  HENT: Negative.  Negative for ear discharge, ear pain, hearing loss, nosebleeds and sore throat.   Eyes: Negative.  Negative for blurred vision and pain.  Respiratory:  Negative.  Negative for cough, hemoptysis, shortness of breath and wheezing.   Cardiovascular: Negative.  Negative for chest pain, palpitations and leg swelling.  Gastrointestinal: Negative.  Negative for abdominal pain, blood in stool, diarrhea, nausea and vomiting.  Genitourinary: Negative.  Negative for dysuria.  Musculoskeletal: Negative.  Negative for back pain.  Skin: Negative.   Neurological: Positive for sensory change. Negative for dizziness, tremors, speech change, focal weakness, seizures and headaches.  Endo/Heme/Allergies: Negative.  Does not bruise/bleed easily.  Psychiatric/Behavioral: Negative.  Negative for depression, hallucinations and suicidal ideas.    MEDICATIONS AT HOME:   Prior to Admission medications   Medication Sig Start Date End Date Taking? Authorizing Provider  albuterol (PROVENTIL HFA;VENTOLIN HFA) 108 (90 Base) MCG/ACT inhaler Inhale 2 puffs into the lungs every 4 (four) hours as needed for wheezing or shortness of breath. 02/25/17  Yes Cuthriell, Charline Bills, PA-C  amLODipine (NORVASC) 10 MG tablet Take 10 mg by mouth daily.   Yes [provider]  brompheniramine-pseudoephedrine-DM 30-2-10 MG/5ML syrup Take 10 mLs by mouth 4 (four) times daily as needed. 02/25/17  Yes Cuthriell, Charline Bills, PA-C  docusate sodium (COLACE) 100 MG capsule Take 100 mg by mouth 2 (two) times daily.   Yes [provider]  Ferrous Sulfate (IRON) 325 (65 Fe) MG TABS Take 1 tablet (325 mg total) by mouth daily. 02/13/18  Yes Alfred Levins, [place], MD  ondansetron [facility] ODT) 4  MG disintegrating tablet Take 1 tablet (4 mg total) by mouth every 8 (eight) hours as needed for nausea or vomiting. 07/05/17  Yes Harvest Dark, MD      VITAL SIGNS:  Blood pressure (!) 122/108, pulse (!) 125, temperature 98.2 F (36.8 C), temperature source Oral, resp. rate 17, height 6' (1.829 m), weight 90.7 kg, SpO2 96 %.  PHYSICAL EXAMINATION:   Physical Exam Constitutional:       General: She is not in acute distress. HENT:     Head: Normocephalic.  Eyes:     General: No scleral icterus. Neck:     Musculoskeletal: Normal range of motion and neck supple.     Vascular: No JVD.     Trachea: No tracheal deviation.  Cardiovascular:     Rate and Rhythm: Regular rhythm. Tachycardia present.     Heart sounds: Normal heart sounds. No murmur. No friction rub. No gallop.   Pulmonary:     Effort: Pulmonary effort is normal. No respiratory distress.     Breath sounds: Normal breath sounds. No wheezing or rales.  Chest:     Chest wall: No tenderness.  Abdominal:     General: Bowel sounds are normal. There is no distension.     Palpations: Abdomen is soft. There is no mass.     Tenderness: There is no abdominal tenderness. There is no guarding or rebound.  Musculoskeletal: Normal range of motion.  Skin:    General: Skin is warm.     Findings: No erythema or rash.  Neurological:     Mental Status: She is alert and oriented to person, place, and time.  Psychiatric:        Judgment: Judgment normal.       LABORATORY PANEL:   CBC Recent Labs  Lab 03/29/18 1234  WBC 4.4  HGB 9.6*  HCT 28.7*  PLT 161   ------------------------------------------------------------------------------------------------------------------  Chemistries  Recent Labs  Lab 03/29/18 1234  NA 139  K 3.9  CL 102  CO2 21*  GLUCOSE 96  BUN 22*  CREATININE 1.67*  CALCIUM 7.6*   ------------------------------------------------------------------------------------------------------------------  Cardiac Enzymes Recent Labs  Lab 03/29/18 1234  TROPONINI <0.03   ------------------------------------------------------------------------------------------------------------------  RADIOLOGY:  Dg Chest 2 View  Result Date: 03/29/2018 CLINICAL DATA:  Hypertension. Chest discomfort. Upper extremity paresthesias. EXAM: CHEST - 2 VIEW COMPARISON:  02/13/2018 FINDINGS: The heart size and  mediastinal contours are within normal limits. Both lungs are clear. The visualized skeletal structures are unremarkable. IMPRESSION: No active cardiopulmonary disease. Electronically Signed   By: Earle Gell M.D.   On: 03/29/2018 17:22   Ct Head Wo Contrast  Result Date: 03/29/2018 CLINICAL DATA:  Dizziness, upper extremity paresthesias EXAM: CT HEAD WITHOUT CONTRAST TECHNIQUE: Contiguous axial images were obtained from the base of the skull through the vertex without intravenous contrast. COMPARISON:  None available FINDINGS: Brain: Slight brain atrophy pattern without acute intracranial hemorrhage, new infarction, midline shift herniation or extra-axial fluid collection. No focal brain edema. Cisterns are patent. Posterior fossa CSF attenuation cystic structure within the midline extending over the left cerebellar hemisphere roughly measuring 7.1 x 2.5 cm more compatible with a chronic benign appearing arachnoid cyst rather than a mega cisterna magna. Vascular: No hyperdense vessel or unexpected calcification. Skull: Normal. Negative for fracture or focal lesion. Sinuses/Orbits: No acute finding. Other: None. IMPRESSION: Slight brain atrophy for the patient's age. No acute intracranial abnormality by noncontrast CT Posterior fossa benign-appearing arachnoid cyst, as above. Electronically Signed   By: Jerilynn Mages.  Shick M.D.   On: 03/29/2018 16:42    EKG:   Sinus tachycardia heart rate 135 no ST elevation or depression  IMPRESSION AND PLAN:   48 year old female with history of essential hypertension and EtOH abuse who presents with numbness of bilateral hands and found to have elevated blood pressure and heart rate.  1.  Hypertensive emergency with tachycardia: This may be due to EtOH withdrawal and/or noncompliance of home medications Start labetalol drip Discussed case with Dr.kasa  2.  EtOH abuse: Start CIWA protocol  3.  Numbness of both hands which has now resolved: CT head negative for acute  pathology Symptoms have resolved Suspect in part due to elevated blood pressure If patient has neurological deficits and consider MRI brain  4.  Acute kidney injury in the setting of elevated blood pressure and possible mild dehydration Repeat BMP in a.m. after gentle hydration  5.  Anemia of chronic disease with stable hemoglobin    All the records are reviewed and case discussed with ED provider. Management plans discussed with the patient and she is in agreement  CODE STATUS: full  Critical care TOTAL TIME TAKING CARE OF THIS PATIENT: 52 minutes.    Ludmilla Mcgillis M.D on 03/29/2018 at 7:22 PM  Between 7am to 6pm - Pager - [phone]  After 6pm go to [email or web address] - password [email or web address] [facility]  Office  [phone]  CC: Primary care physician; [facility] [place]

## 2018-03-29 NOTE — ED Notes (Signed)
CCU states to continuing holding report.

## 2018-03-30 LAB — BASIC METABOLIC PANEL
Anion gap: 13 (ref 5–15)
BUN: 24 mg/dL — ABNORMAL HIGH (ref 6–20)
CO2: 23 mmol/L (ref 22–32)
CREATININE: 1.52 mg/dL — AB (ref 0.44–1.00)
Calcium: 6.8 mg/dL — ABNORMAL LOW (ref 8.9–10.3)
Chloride: 102 mmol/L (ref 98–111)
GFR calc Af Amer: 46 mL/min — ABNORMAL LOW (ref >60)
GFR calc non Af Amer: 40 mL/min — ABNORMAL LOW (ref >60)
Glucose, Bld: 135 mg/dL — ABNORMAL HIGH (ref 70–99)
Potassium: 3.6 mmol/L (ref 3.5–5.1)
Sodium: 138 mmol/L (ref 135–145)

## 2018-03-30 LAB — GLUCOSE, CAPILLARY: Glucose-Capillary: 127 mg/dL — ABNORMAL HIGH (ref 70–99)

## 2018-03-30 LAB — MAGNESIUM: Magnesium: 0.6 mg/dL — CL (ref 1.7–2.4)

## 2018-03-30 LAB — MRSA PCR SCREENING: MRSA by PCR: NEGATIVE

## 2018-03-30 MED ORDER — DILTIAZEM HCL-DEXTROSE 100-5 MG/100ML-% IV SOLN (PREMIX)
5.0000 mg/h | INTRAVENOUS | Status: DC
  Administered 2018-03-30: 5 mg/h via INTRAVENOUS
  Filled 2018-03-30: qty 100

## 2018-03-30 MED ORDER — AMLODIPINE BESYLATE 10 MG PO TABS: 10.0000 mg | ORAL_TABLET | Freq: Every day | ORAL | 1 refills | Status: DC

## 2018-03-30 MED ORDER — MAGNESIUM SULFATE 4 GM/100ML IV SOLN
4.0000 g | Freq: Once | INTRAVENOUS | Status: AC
  Administered 2018-03-30: 4 g via INTRAVENOUS
  Filled 2018-03-30: qty 100

## 2018-03-30 MED ORDER — POTASSIUM CHLORIDE CRYS ER 20 MEQ PO TBCR
40.0000 meq | EXTENDED_RELEASE_TABLET | Freq: Once | ORAL | Status: AC
  Administered 2018-03-30: 40 meq via ORAL
  Filled 2018-03-30: qty 2

## 2018-03-30 NOTE — Progress Notes (Signed)
Pt states that she needs to leave the hospital because her mother is being rushed to [facility] hospital in critical care. The pt states "I can come back tomorrow, but I have to leave now to see my mom and my ride is on the way." Pt is AOx4, vss, she denies any pain, headache or dizziness at this time. Pt is requesting a prescription for amlodipine and a work note. Gouru, MD was page and she referred me to the ICU attending. Charge RN and ICU attending made aware of the situation. The pt was given a prescription for amlodipine and a note stating that she was in the hospital admitted since 2/24. The is being discharged AMA and transported to her family's vehicle via wheelchair by this RN. Pt was thoroughly educated on hypertension control, importance of taking her medications as prescribed and to follow up with her primary care physician promptly on tomorrow and she verbalized understanding of all of the information given.

## 2018-03-30 NOTE — Progress Notes (Addendum)
Critical Lab Value - Mg 0.6 Called E-link to notify of critical value.  Awaiting response.   0600 - Elink - Dr. Emmit Alexanders placed new orders to replace Mg, and order for K+

## 2018-03-30 NOTE — Progress Notes (Addendum)
PHARMACY CONSULT NOTE - FOLLOW UP  Pharmacy Consult for Electrolyte Monitoring and Replacement   Recent Labs: Potassium (mmol/L)  Date Value  03/30/2018 3.6  04/25/2013 4.0   Magnesium (mg/dL)  Date Value  03/30/2018 0.6 (LL)   Calcium (mg/dL)  Date Value  03/30/2018 6.8 (L)   Calcium, Total (mg/dL)  Date Value  04/25/2013 8.4 (L)   Albumin (g/dL)  Date Value  02/13/2018 4.4  04/25/2013 4.2   Sodium (mmol/L)  Date Value  03/30/2018 138  04/25/2013 135 (L)     Assessment: Patient is a 48yo female admitted with Hypertensive urgency. She has a history of EtOH abuse and is currently on CIWA protocol. Pharmacy has been consulted to manage electrolytes  Plan:  Mag is low at 0.6, has already received one dose of Mag sulfate 4g IV. Will order an additional dose of MagSulfate 4g IV.  Calcium is also low at 6.8, do not have an albumin to calculate corrected calcium. Anticipate that albumin is low due to EtOH abuse and that Ca would correct to within normal range. Will hold off replacing and check an albumin with next lab draw. Follow up on AM labs.  Paulina Fusi, PharmD, BCPS 03/30/2018 4:58 PM

## 2018-03-30 NOTE — Progress Notes (Signed)
[place] Progress Note Patient Name: Megan Lambert DOB: Apr 27, 1970 MRN: [id number]   Date of Service  03/30/2018  HPI/Events of Note  K+ = 3.6, Mg++ = 0.6 and Creatinine = 1.52.   eICU Interventions  Will replace K+ and Mg++.     Intervention Category Major Interventions: Electrolyte abnormality - evaluation and management  Sommer,Steven Eugene 03/30/2018, 6:01 AM

## 2018-03-30 NOTE — Progress Notes (Signed)
   03/30/18 1100  Clinical Encounter Type  Visited With Patient  Visit Type Initial  Referral From Chaplain  Consult/Referral To [place] was rounding and stop to visit patient. Chaplain introduced herself to patient and patient was glad that Chaplain stop to visit. Chaplain ask patient how was she feeling and patient said much better. Chaplain ask was there anything she could do for patient and patient said prayer. Chaplain prayed for patient and gave blessings for good health.

## 2018-03-30 NOTE — Progress Notes (Signed)
[place] at [place] NAME: Megan Lambert    MR#:  [id number]  DATE OF BIRTH:  [birth year]  SUBJECTIVE:  CHIEF COMPLAINT: Patient is feeling fine today denies any headache or blurry vision.  Denies any numbness in her extremities  REVIEW OF SYSTEMS:  CONSTITUTIONAL: No fever, fatigue or weakness.  EYES: No blurred or double vision.  EARS, NOSE, AND THROAT: No tinnitus or ear pain.  RESPIRATORY: No cough, shortness of breath, wheezing or hemoptysis.  CARDIOVASCULAR: No chest pain, orthopnea, edema.  GASTROINTESTINAL: No nausea, vomiting, diarrhea or abdominal pain.  GENITOURINARY: No dysuria, hematuria.  ENDOCRINE: No polyuria, nocturia,  HEMATOLOGY: No anemia, easy bruising or bleeding SKIN: No rash or lesion. MUSCULOSKELETAL: No joint pain or arthritis.   NEUROLOGIC: No tingling, numbness, weakness.  PSYCHIATRY: No anxiety or depression.   DRUG ALLERGIES:   Allergies  Allergen Reactions  . Lisinopril Swelling    VITALS:  Blood pressure 108/87, pulse 77, temperature 98 F (36.7 C), temperature source Oral, resp. rate 14, height 6' (1.829 m), weight 101.3 kg, SpO2 97 %.  PHYSICAL EXAMINATION:  GENERAL:  48 y.o.-year-old patient lying in the bed with no acute distress.  EYES: Pupils equal, round, reactive to light and accommodation. No scleral icterus. Extraocular muscles intact.  HEENT: Head atraumatic, normocephalic. Oropharynx and nasopharynx clear.  NECK:  Supple, no jugular venous distention. No thyroid enlargement, no tenderness.  LUNGS: Normal breath sounds bilaterally, no wheezing, rales,rhonchi or crepitation. No use of accessory muscles of respiration.  CARDIOVASCULAR: S1, S2 normal. No murmurs, rubs, or gallops.  ABDOMEN: Soft, nontender, nondistended. Bowel sounds present.  EXTREMITIES: No pedal edema, cyanosis, or clubbing.  NEUROLOGIC: Cranial nerves II through XII are intact. Muscle strength 5/5 in all  extremities. Sensation intact. Gait not checked.  PSYCHIATRIC: The patient is alert and oriented x 3.  SKIN: No obvious rash, lesion, or ulcer.    LABORATORY PANEL:   CBC Recent Labs  Lab 03/29/18 1234  WBC 4.4  HGB 9.6*  HCT 28.7*  PLT 161   ------------------------------------------------------------------------------------------------------------------  Chemistries  Recent Labs  Lab 03/30/18 0411  NA 138  K 3.6  CL 102  CO2 23  GLUCOSE 135*  BUN 24*  CREATININE 1.52*  CALCIUM 6.8*  MG 0.6*   ------------------------------------------------------------------------------------------------------------------  Cardiac Enzymes Recent Labs  Lab 03/29/18 1234  TROPONINI <0.03   ------------------------------------------------------------------------------------------------------------------  RADIOLOGY:  Dg Chest 2 View  Result Date: 03/29/2018 CLINICAL DATA:  Hypertension. Chest discomfort. Upper extremity paresthesias. EXAM: CHEST - 2 VIEW COMPARISON:  02/13/2018 FINDINGS: The heart size and mediastinal contours are within normal limits. Both lungs are clear. The visualized skeletal structures are unremarkable. IMPRESSION: No active cardiopulmonary disease. Electronically Signed   By: Earle Gell M.D.   On: 03/29/2018 17:22   Ct Head Wo Contrast  Result Date: 03/29/2018 CLINICAL DATA:  Dizziness, upper extremity paresthesias EXAM: CT HEAD WITHOUT CONTRAST TECHNIQUE: Contiguous axial images were obtained from the base of the skull through the vertex without intravenous contrast. COMPARISON:  None available FINDINGS: Brain: Slight brain atrophy pattern without acute intracranial hemorrhage, new infarction, midline shift herniation or extra-axial fluid collection. No focal brain edema. Cisterns are patent. Posterior fossa CSF attenuation cystic structure within the midline extending over the left cerebellar hemisphere roughly measuring 7.1 x 2.5 cm more compatible with a  chronic benign appearing arachnoid cyst rather than a mega cisterna magna. Vascular: No hyperdense vessel or unexpected calcification. Skull: Normal. Negative for fracture or  focal lesion. Sinuses/Orbits: No acute finding. Other: None. IMPRESSION: Slight brain atrophy for the patient's age. No acute intracranial abnormality by noncontrast CT Posterior fossa benign-appearing arachnoid cyst, as above. Electronically Signed   By: Jerilynn Mages.  Shick M.D.   On: 03/29/2018 16:42    EKG:   Orders placed or performed during the hospital encounter of 03/29/18  . EKG 12-Lead  . EKG 12-Lead  . ED EKG  . ED EKG    ASSESSMENT AND PLAN:    48 year old female with history of essential hypertension and EtOH abuse who presents with numbness of bilateral hands and found to have elevated blood pressure and heart rate.  1.  Hypertensive emergency with tachycardia: This may be due to EtOH withdrawal and/or noncompliance of home medications Improved with labetalol drip-signed off Discussed case with Dr.kasa  2.  EtOH abuse: CIWA protocol   3.  Numbness of both hands which has now resolved: CT head negative for acute pathology Symptoms have resolved Suspect in part due to elevated blood pressure If patient has neurological deficits will  consider MRI brain  4.  Acute kidney injury in the setting of elevated blood pressure and possible mild dehydration Creatinine 1.67-1.52 Repeat BMP in a.m.   5.  Anemia of chronic disease with stable hemoglobin  6.  Chronic varicose veins outpatient follow-up with Dr. Delana Meyer  7.  Hypocalcemia and hypomagnesemia Replete and recheck in a.m.    All the records are reviewed and case discussed with Care Management/Social Workerr. Management plans discussed with the patient, family and they are in agreement.  CODE STATUS: fc   TOTAL TIME TAKING CARE OF THIS PATIENT: 36  minutes.   POSSIBLE D/C IN 1-2  DAYS, DEPENDING ON CLINICAL CONDITION.  Note: This dictation  was prepared with Dragon dictation along with smaller phrase technology. Any transcriptional errors that result from this process are unintentional.   Nicholes Mango M.D on 03/30/2018 at 2:58 PM  Between 7am to 6pm - Pager - [phone] After 6pm go to [email or web address] - password [email or web address] [facility]  Office  [phone]  CC: Primary care physician; [facility] [place]

## 2018-03-30 NOTE — Discharge Summary (Signed)
Physician Discharge Summary  Patient ID: Megan Lambert MRN: [id number] DOB/AGE: 10-Jan-1971 48 y.o.  Admit date: 03/29/2018 Discharge date: 03/30/2018  Admission Diagnoses:HTN URGENCY  Discharge Diagnoses:  Active Problems:   Hypertensive urgency, malignant   Discharged Condition: patient left [facility]  Hospital Course: labetolol infusion/ICU admission BP improved Started on PRocardia but patient did NOT want to stay  She left AMA    Signed: Flora Lipps 03/30/2018, 5:36 PM

## 2018-03-31 LAB — HIV ANTIBODY (ROUTINE TESTING W REFLEX): HIV Screen 4th Generation wRfx: NONREACTIVE

## 2020-03-11 DIAGNOSIS — K852 Alcohol induced acute pancreatitis without necrosis or infection: Secondary | ICD-10-CM | POA: Insufficient documentation

## 2020-03-15 DIAGNOSIS — D271 Benign neoplasm of left ovary: Secondary | ICD-10-CM | POA: Insufficient documentation

## 2020-03-15 DIAGNOSIS — D251 Intramural leiomyoma of uterus: Secondary | ICD-10-CM | POA: Insufficient documentation

## 2020-03-23 DIAGNOSIS — E559 Vitamin D deficiency, unspecified: Secondary | ICD-10-CM | POA: Insufficient documentation

## 2020-03-23 DIAGNOSIS — D259 Leiomyoma of uterus, unspecified: Secondary | ICD-10-CM | POA: Insufficient documentation

## 2020-04-03 DIAGNOSIS — D649 Anemia, unspecified: Secondary | ICD-10-CM | POA: Insufficient documentation

## 2020-04-03 DIAGNOSIS — N189 Chronic kidney disease, unspecified: Secondary | ICD-10-CM | POA: Insufficient documentation

## 2020-04-03 DIAGNOSIS — D631 Anemia in chronic kidney disease: Secondary | ICD-10-CM | POA: Insufficient documentation

## 2020-07-21 IMAGING — CT CT HEAD W/O CM
3 series · 15 of 46 positions shown, 18 images · non-contrast
Comparison: None available

CLINICAL DATA: Dizziness, upper extremity paresthesias

EXAM:
CT HEAD WITHOUT CONTRAST
TECHNIQUE: Contiguous axial images were obtained from the base of the skull
through the vertex without intravenous contrast.

[Series 2: head wo · axial · 0.47mm/px · z∈[-134,-14]mm · 9 of 29 slices shown, 12 images]
[im 3/29  brain]
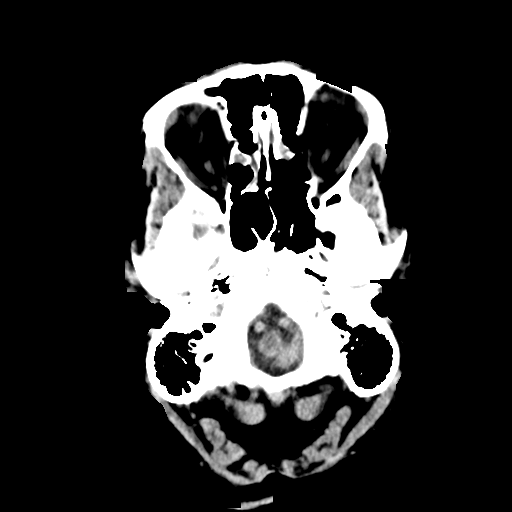
[im 3/29  bone]
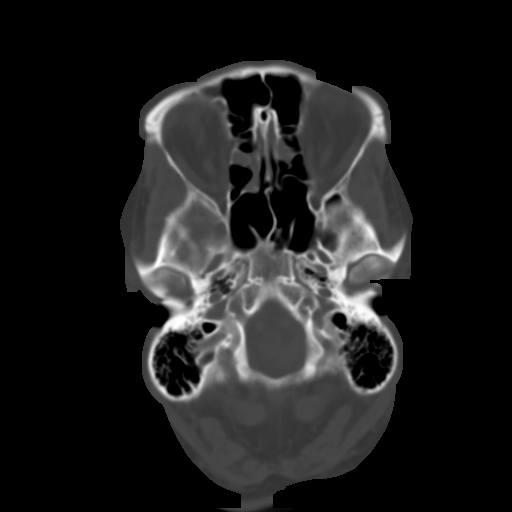
[im 6/29  brain]
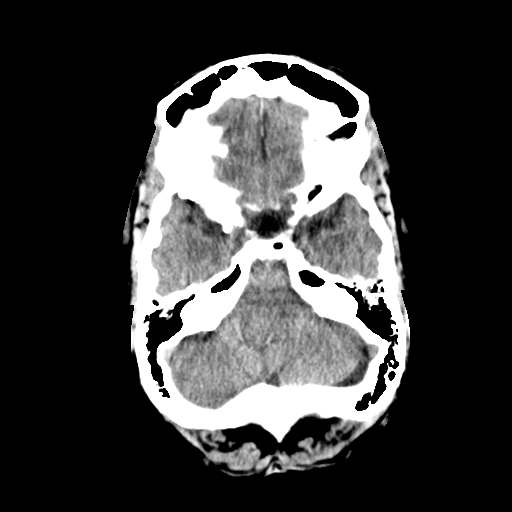
[im 9/29  brain]
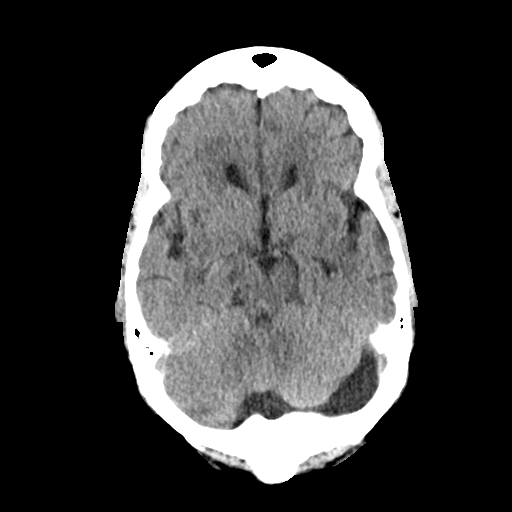
[im 12/29  brain]
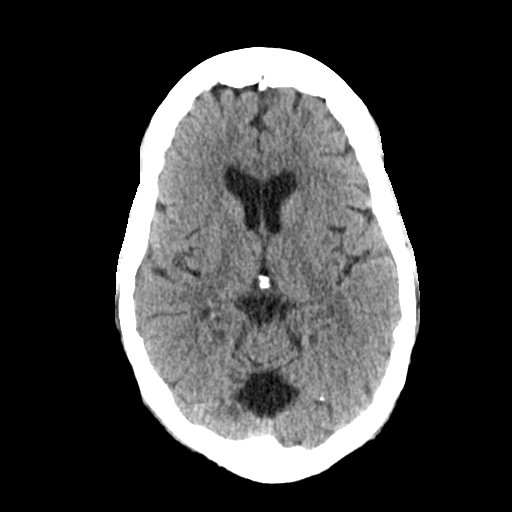
[im 15/29  brain]
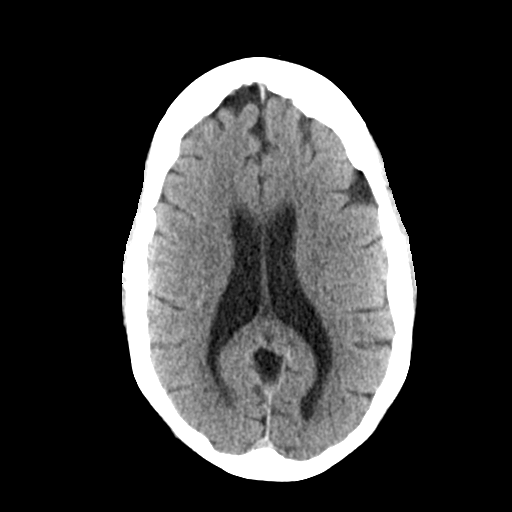
[im 15/29  bone]
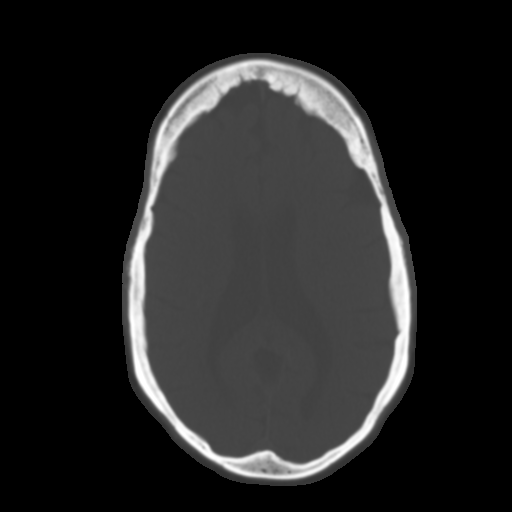
[im 18/29  brain]
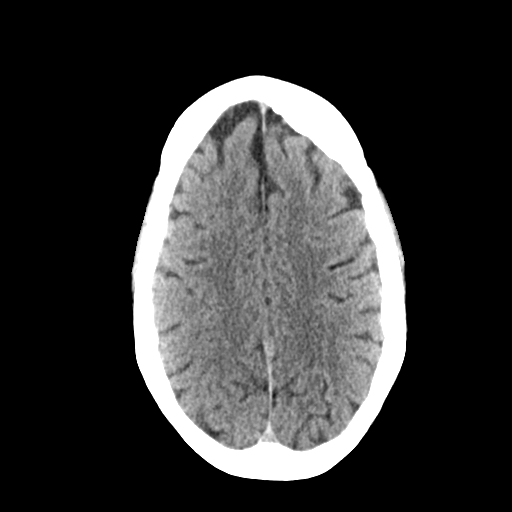
[im 21/29  brain]
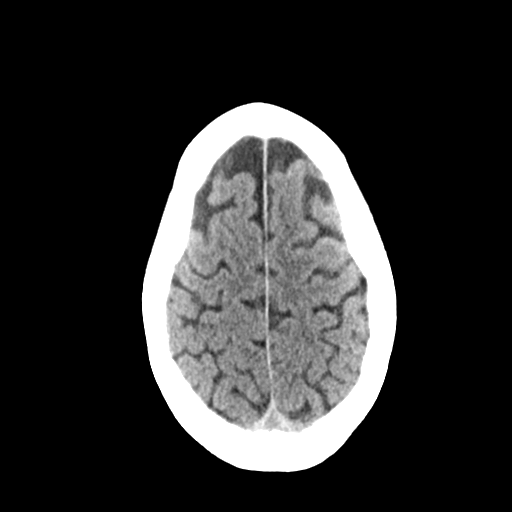
[im 24/29  brain]
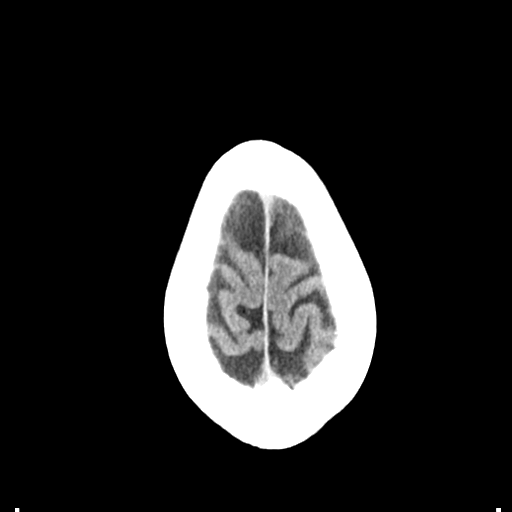
[im 27/29  brain]
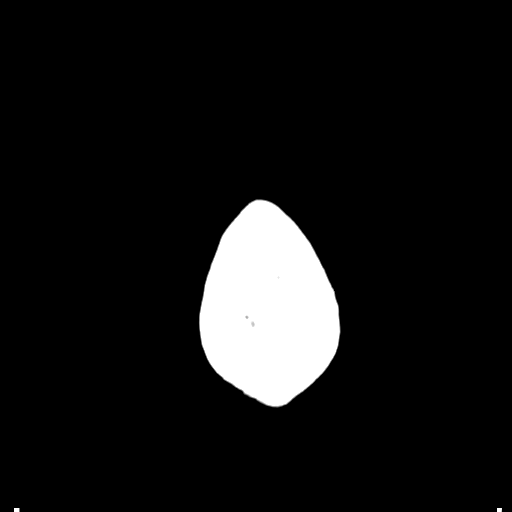
[im 27/29  bone]
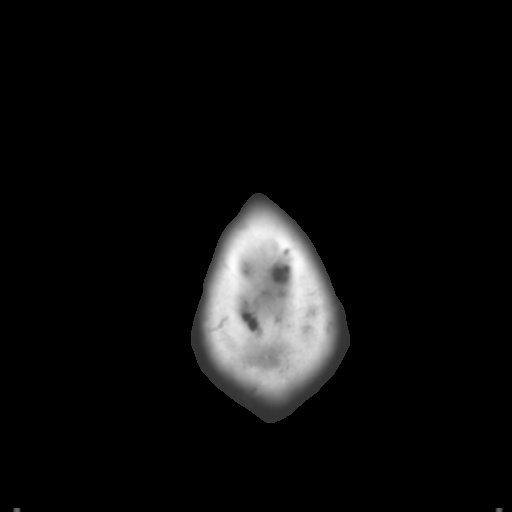

[Series 4: coronal soft tissue · coronal · 0.30mm/px · 3 of 69 slices shown]
[im 23/69  brain]
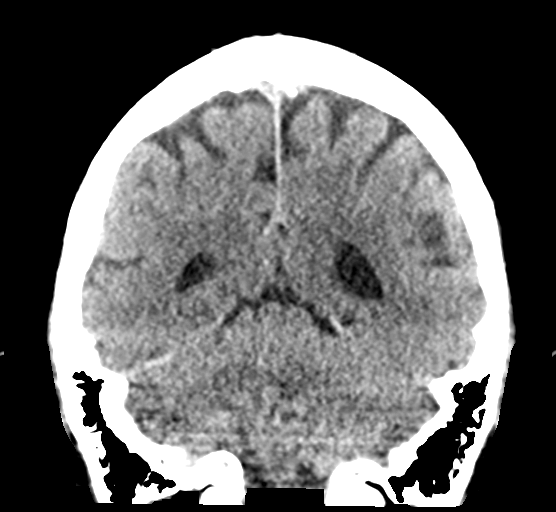
[im 31/69  brain]
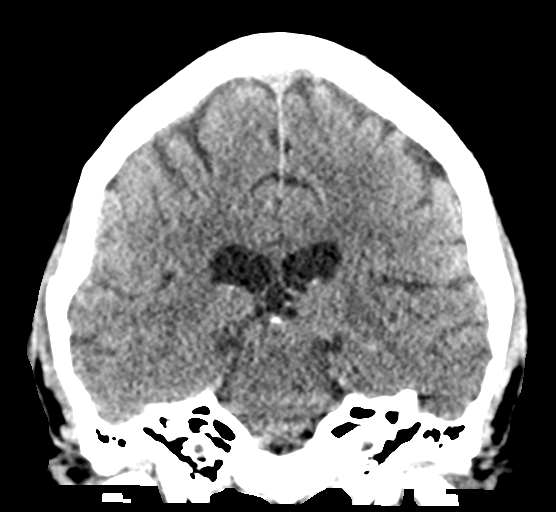
[im 38/69  brain]
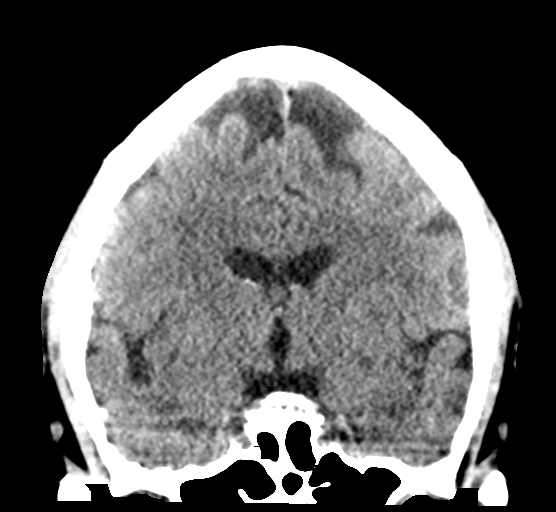

[Series 5: sagittal soft tissue · sagittal · 0.30mm/px · 3 of 52 slices shown]
[im 18/52  brain]
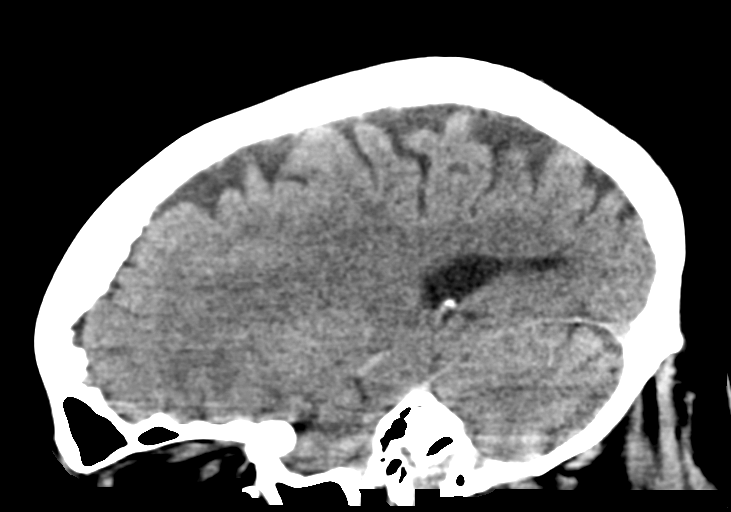
[im 26/52  brain]
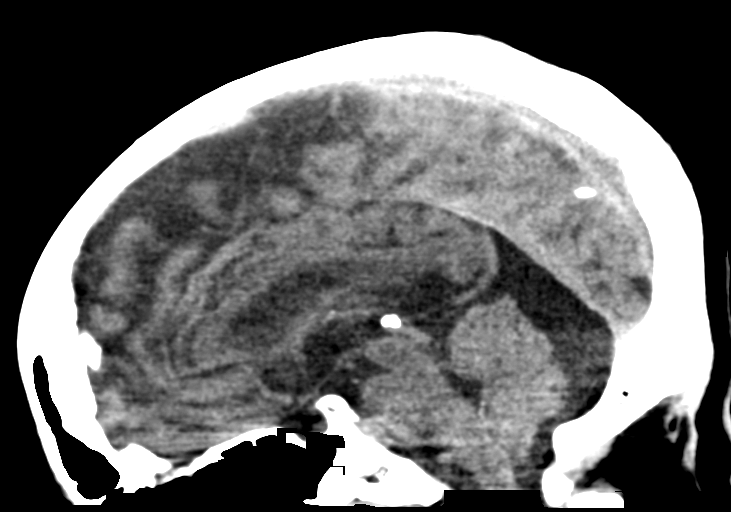
[im 35/52  brain]
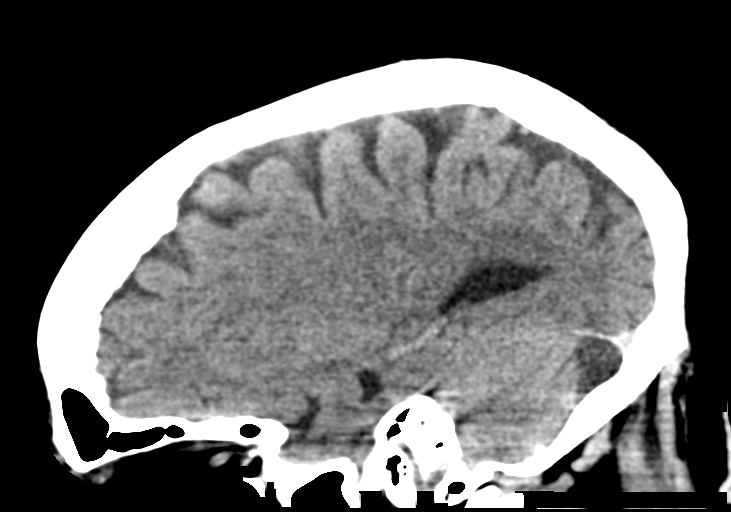

[15 of 46 positions shown; findings below may reference images not displayed]

FINDINGS: Brain: Slight brain atrophy pattern without acute intracranial
hemorrhage, new infarction, midline shift herniation or extra-axial
fluid collection. No focal brain edema. Cisterns are patent.

Posterior fossa CSF attenuation cystic structure within the midline
extending over the left cerebellar hemisphere roughly measuring
x 2.5 cm more compatible with a chronic benign appearing arachnoid
cyst rather than a mega cisterna magna.

Vascular: No hyperdense vessel or unexpected calcification.

Skull: Normal. Negative for fracture or focal lesion.

Sinuses/Orbits: No acute finding.

Other: None.
IMPRESSION: Slight brain atrophy for the patient's age.

No acute intracranial abnormality by noncontrast CT

Posterior fossa benign-appearing arachnoid cyst, as above.

## 2021-08-21 ENCOUNTER — Encounter: Payer: Self-pay | Admitting: Emergency Medicine

## 2021-08-21 ENCOUNTER — Emergency Department: Payer: Managed Care, Other (non HMO)

## 2021-08-21 ENCOUNTER — Other Ambulatory Visit: Payer: Self-pay

## 2021-08-21 ENCOUNTER — Inpatient Hospital Stay
Admission: EM | Admit: 2021-08-21 | Discharge: 2021-08-23 | DRG: 683 | Disposition: A | Discharge status: Home / Self Care | Payer: Managed Care, Other (non HMO) | Attending: Internal Medicine | Admitting: Internal Medicine

## 2021-08-21 DIAGNOSIS — Z79899 Other long term (current) drug therapy: Secondary | ICD-10-CM

## 2021-08-21 DIAGNOSIS — E669 Obesity, unspecified: Secondary | ICD-10-CM | POA: Diagnosis present

## 2021-08-21 DIAGNOSIS — R42 Dizziness and giddiness: Secondary | ICD-10-CM

## 2021-08-21 DIAGNOSIS — N185 Chronic kidney disease, stage 5: Secondary | ICD-10-CM

## 2021-08-21 DIAGNOSIS — N179 Acute kidney failure, unspecified: Principal | ICD-10-CM | POA: Diagnosis present

## 2021-08-21 DIAGNOSIS — J45909 Unspecified asthma, uncomplicated: Secondary | ICD-10-CM | POA: Diagnosis present

## 2021-08-21 DIAGNOSIS — E871 Hypo-osmolality and hyponatremia: Secondary | ICD-10-CM | POA: Diagnosis present

## 2021-08-21 DIAGNOSIS — E872 Acidosis, unspecified: Secondary | ICD-10-CM | POA: Diagnosis present

## 2021-08-21 DIAGNOSIS — E861 Hypovolemia: Secondary | ICD-10-CM | POA: Diagnosis present

## 2021-08-21 DIAGNOSIS — E876 Hypokalemia: Secondary | ICD-10-CM | POA: Diagnosis not present

## 2021-08-21 DIAGNOSIS — Z6830 Body mass index (BMI) 30.0-30.9, adult: Secondary | ICD-10-CM

## 2021-08-21 DIAGNOSIS — N2581 Secondary hyperparathyroidism of renal origin: Secondary | ICD-10-CM | POA: Diagnosis present

## 2021-08-21 DIAGNOSIS — N189 Chronic kidney disease, unspecified: Secondary | ICD-10-CM | POA: Diagnosis present

## 2021-08-21 DIAGNOSIS — R0602 Shortness of breath: Secondary | ICD-10-CM | POA: Diagnosis present

## 2021-08-21 DIAGNOSIS — I10 Essential (primary) hypertension: Secondary | ICD-10-CM | POA: Diagnosis present

## 2021-08-21 DIAGNOSIS — Z87898 Personal history of other specified conditions: Secondary | ICD-10-CM

## 2021-08-21 DIAGNOSIS — Z20822 Contact with and (suspected) exposure to covid-19: Secondary | ICD-10-CM | POA: Diagnosis present

## 2021-08-21 DIAGNOSIS — Z888 Allergy status to other drugs, medicaments and biological substances status: Secondary | ICD-10-CM

## 2021-08-21 DIAGNOSIS — M898X9 Other specified disorders of bone, unspecified site: Secondary | ICD-10-CM | POA: Diagnosis present

## 2021-08-21 DIAGNOSIS — F1010 Alcohol abuse, uncomplicated: Secondary | ICD-10-CM | POA: Diagnosis present

## 2021-08-21 DIAGNOSIS — D631 Anemia in chronic kidney disease: Secondary | ICD-10-CM | POA: Diagnosis present

## 2021-08-21 DIAGNOSIS — I120 Hypertensive chronic kidney disease with stage 5 chronic kidney disease or end stage renal disease: Secondary | ICD-10-CM | POA: Diagnosis present

## 2021-08-21 DIAGNOSIS — E860 Dehydration: Secondary | ICD-10-CM | POA: Diagnosis present

## 2021-08-21 DIAGNOSIS — D649 Anemia, unspecified: Secondary | ICD-10-CM

## 2021-08-21 DIAGNOSIS — N939 Abnormal uterine and vaginal bleeding, unspecified: Secondary | ICD-10-CM | POA: Diagnosis present

## 2021-08-21 LAB — BASIC METABOLIC PANEL
Anion gap: 15 (ref 5–15)
BUN: 46 mg/dL — ABNORMAL HIGH (ref 6–20)
CO2: 18 mmol/L — ABNORMAL LOW (ref 22–32)
Calcium: 6.6 mg/dL — ABNORMAL LOW (ref 8.9–10.3)
Chloride: 95 mmol/L — ABNORMAL LOW (ref 98–111)
Creatinine, Ser: 5.64 mg/dL — ABNORMAL HIGH (ref 0.44–1.00)
GFR, Estimated: 9 mL/min — ABNORMAL LOW (ref >60)
Glucose, Bld: 99 mg/dL (ref 70–99)
Potassium: 3 mmol/L — ABNORMAL LOW (ref 3.5–5.1)
Sodium: 128 mmol/L — ABNORMAL LOW (ref 135–145)

## 2021-08-21 LAB — CBC
HCT: 21.5 % — ABNORMAL LOW (ref 36.0–46.0)
Hemoglobin: 7.3 g/dL — ABNORMAL LOW (ref 12.0–15.0)
MCH: 27.5 pg (ref 26.0–34.0)
MCHC: 34 g/dL (ref 30.0–36.0)
MCV: 81.1 fL (ref 80.0–100.0)
Platelets: 143 10*3/uL — ABNORMAL LOW (ref 150–400)
RBC: 2.65 MIL/uL — ABNORMAL LOW (ref 3.87–5.11)
RDW: 17.7 % — ABNORMAL HIGH (ref 11.5–15.5)
WBC: 3.9 10*3/uL — ABNORMAL LOW (ref 4.0–10.5)
nRBC: 0 % (ref 0.0–0.2)

## 2021-08-21 LAB — MAGNESIUM: Magnesium: 0.8 mg/dL — CL (ref 1.7–2.4)

## 2021-08-21 LAB — IRON AND TIBC
Iron: 101 ug/dL (ref 28–170)
Saturation Ratios: 43 % — ABNORMAL HIGH (ref 10.4–31.8)
TIBC: 234 ug/dL — ABNORMAL LOW (ref 250–450)
UIBC: 133 ug/dL

## 2021-08-21 LAB — TROPONIN I (HIGH SENSITIVITY): Troponin I (High Sensitivity): 14 ng/L (ref <18)

## 2021-08-21 LAB — TSH: TSH: 2.985 u[IU]/mL (ref 0.350–4.500)

## 2021-08-21 LAB — BRAIN NATRIURETIC PEPTIDE: B Natriuretic Peptide: 37.8 pg/mL (ref 0.0–100.0)

## 2021-08-21 LAB — URINALYSIS, ROUTINE W REFLEX MICROSCOPIC
Bilirubin Urine: NEGATIVE
Glucose, UA: NEGATIVE mg/dL
Ketones, ur: NEGATIVE mg/dL
Nitrite: NEGATIVE
Protein, ur: 100 mg/dL — AB
Specific Gravity, Urine: 1.017 (ref 1.005–1.030)
pH: 5 (ref 5.0–8.0)

## 2021-08-21 LAB — CK: Total CK: 205 U/L (ref 38–234)

## 2021-08-21 LAB — ETHANOL: Alcohol, Ethyl (B): <10 mg/dL (ref <10)

## 2021-08-21 LAB — SARS CORONAVIRUS 2 BY RT PCR: SARS Coronavirus 2 by RT PCR: NEGATIVE

## 2021-08-21 MED ORDER — PANTOPRAZOLE SODIUM 40 MG IV SOLR
40.0000 mg | Freq: Two times a day (BID) | INTRAVENOUS | Status: DC
  Administered 2021-08-21 – 2021-08-23 (×4): 40 mg via INTRAVENOUS
  Filled 2021-08-21 (×4): qty 10

## 2021-08-21 MED ORDER — LACTATED RINGERS IV BOLUS
1000.0000 mL | Freq: Once | INTRAVENOUS | Status: AC
  Administered 2021-08-21: 1000 mL via INTRAVENOUS

## 2021-08-21 MED ORDER — MAGNESIUM SULFATE 2 GM/50ML IV SOLN
2.0000 g | Freq: Once | INTRAVENOUS | Status: AC
  Administered 2021-08-21: 2 g via INTRAVENOUS
  Filled 2021-08-21: qty 50

## 2021-08-21 MED ORDER — HYDRALAZINE HCL 20 MG/ML IJ SOLN: 10.0000 mg | Freq: Four times a day (QID) | INTRAMUSCULAR | Status: DC | PRN

## 2021-08-21 MED ORDER — SODIUM CHLORIDE 0.9% FLUSH
3.0000 mL | Freq: Two times a day (BID) | INTRAVENOUS | Status: DC
Start: 2021-08-21 — End: 2021-08-23
  Administered 2021-08-22 – 2021-08-23 (×2): 3 mL via INTRAVENOUS

## 2021-08-21 MED ORDER — THIAMINE HCL 100 MG/ML IJ SOLN
100.0000 mg | Freq: Every day | INTRAMUSCULAR | Status: DC
  Administered 2021-08-21 – 2021-08-23 (×3): 100 mg via INTRAVENOUS
  Filled 2021-08-21 (×3): qty 2

## 2021-08-21 MED ORDER — SODIUM CHLORIDE 0.9 % IV SOLN: INTRAVENOUS | Status: DC

## 2021-08-21 MED ORDER — ACETAMINOPHEN 650 MG RE SUPP: 650.0000 mg | Freq: Four times a day (QID) | RECTAL | Status: DC | PRN

## 2021-08-21 MED ORDER — ACETAMINOPHEN 325 MG PO TABS: 650.0000 mg | ORAL_TABLET | Freq: Four times a day (QID) | ORAL | Status: DC | PRN

## 2021-08-21 MED ORDER — MORPHINE SULFATE (PF) 2 MG/ML IV SOLN: 2.0000 mg | INTRAVENOUS | Status: DC | PRN

## 2021-08-21 MED ORDER — CALCIUM GLUCONATE-NACL 1-0.675 GM/50ML-% IV SOLN
1.0000 g | Freq: Once | INTRAVENOUS | Status: AC
  Administered 2021-08-21: 1000 mg via INTRAVENOUS
  Filled 2021-08-21: qty 50

## 2021-08-21 MED ORDER — POTASSIUM CHLORIDE CRYS ER 20 MEQ PO TBCR
40.0000 meq | EXTENDED_RELEASE_TABLET | Freq: Once | ORAL | Status: AC
  Administered 2021-08-21: 40 meq via ORAL
  Filled 2021-08-21: qty 2

## 2021-08-21 NOTE — ED Notes (Signed)
Pt transported to US at this time. 

## 2021-08-21 NOTE — H&P (Signed)
History and Physical    Megan Lambert TDD:[id number] DOB: 1970-06-03 DOA: 08/21/2021  PCP: [facility] [place]    Patient coming from:  Home   Chief Complaint:  Dizziness   HPI:  Megan Lambert is a 51 y.o. female seen in ed with complaints of dizziness, shortness of breath for about 2 days.  Admission requested for dehydration, electrolyte derangements as she is on 20 of Lasix daily she says her swelling in her legs. she said she patient coming emergency room symptoms of dizziness. Patient has history of chronic anemia she gets outpatient iron infusion subjective little vaginal spotting is waiting for GYN appointment, denies belly pain. no chest pain no cough no fever.  Hemoglobin looks a little better than before they transfused her last month. ED ordered renal ultrasound a pelvic ultrasound for her vaginal spotting and possibly expedite her work-up.  Patient denies any headaches blurred vision chest pain shortness of breath palpitations fevers chills nausea vomiting diarrhea constipation, melanotic stools. Chart review in care everywhere shows [facility] visit for nephrology and transplant clinic on 08/20/2021.  Pt has past medical history of hypertension, asthma. Patient has allergies to lisinopril. Patient drinks about 3-4 vodka or brandy per day.  ED Course:   Vitals:   08/21/21 1522 08/21/21 1550 08/21/21 1552  BP:  (!) 115/95 98/71  Pulse:  (!) 105   Resp:  18   Temp:  98.5 F (36.9 C)   TempSrc:  Oral   SpO2:  100%   Weight: 101.3 kg    Height: 6' (1.829 m)    In the emergency room patient is alert awake oriented afebrile tachycardic oxygenating 100% on room air. BMP shows hyponatremia with a sodium of 128, potassium of 3.0, bicarb of 18, BUN of 46, creatinine of 5.64 cm of 0.8. CBC shows pancytopenia with a white count of 3.9 hemoglobin of 7.3 platelet count of 143. Point-of-care urine shows few bacteria cloudy urine amber-colored large  hemoglobin trace leukocytes 100 protein and 11-20 WBCs nitrite negative. In the emergency room patient receives 1 L of LR bolus, potassium chloride 40 mg p.o., 2 g magnesium IV, started on normal saline at a maintenance rate of 125 mils per hour. I have ordered a serum ethanol level, CPK, Protonix, thiamine.   Review of Systems:  Review of Systems  Constitutional:  Positive for malaise/fatigue.  Respiratory:  Positive for shortness of breath.   Cardiovascular:  Positive for leg swelling.  Neurological:  Positive for dizziness.    Past Medical History:  Diagnosis Date   Asthma    Hypertension     Past Surgical History:  Procedure Laterality Date   TUBAL LIGATION       reports that she has never smoked. She has never used smokeless tobacco. She reports current alcohol use. She reports that she does not use drugs.  Allergies  Allergen Reactions   Lisinopril Swelling    No family history on file.  Prior to Admission medications   Medication Sig Start Date End Date Taking? Authorizing Provider  albuterol (PROVENTIL HFA;VENTOLIN HFA) 108 (90 Base) MCG/ACT inhaler Inhale 2 puffs into the lungs every 4 (four) hours as needed for wheezing or shortness of breath. 02/25/17   Cuthriell, Charline Bills, PA-C  amLODipine (NORVASC) 10 MG tablet Take 1 tablet (10 mg total) by mouth daily. 03/30/18   Vaughan Basta, MD  brompheniramine-pseudoephedrine-DM 30-2-10 MG/5ML syrup Take 10 mLs by mouth 4 (four) times daily as needed. 02/25/17   Cuthriell,  Charline Bills, PA-C  docusate sodium (COLACE) 100 MG capsule Take 100 mg by mouth 2 (two) times daily.    [provider]  Ferrous Sulfate (IRON) 325 (65 Fe) MG TABS Take 1 tablet (325 mg total) by mouth daily. 02/13/18   Rudene Re, MD  ondansetron (ZOFRAN ODT) 4 MG disintegrating tablet Take 1 tablet (4 mg total) by mouth every 8 (eight) hours as needed for nausea or vomiting. 07/05/17   Harvest Dark, MD    Physical  Exam: Vitals:   08/21/21 1522 08/21/21 1550 08/21/21 1552  BP:  (!) 115/95 98/71  Pulse:  (!) 105   Resp:  18   Temp:  98.5 F (36.9 C)   TempSrc:  Oral   SpO2:  100%   Weight: 101.3 kg    Height: 6' (1.829 m)     Physical Exam Vitals and nursing note reviewed.  Constitutional:      General: She is not in acute distress.    Appearance: Normal appearance. She is not ill-appearing, toxic-appearing or diaphoretic.  HENT:     Head: Normocephalic and atraumatic.     Right Ear: Hearing and external ear normal.     Left Ear: Hearing and external ear normal.     Nose: Nose normal. No nasal deformity.     Mouth/Throat:     Lips: Pink.     Mouth: Mucous membranes are moist.     Tongue: No lesions.     Pharynx: Oropharynx is clear.  Eyes:     Extraocular Movements: Extraocular movements intact.     Pupils: Pupils are equal, round, and reactive to light.  Neck:     Vascular: No carotid bruit.  Cardiovascular:     Rate and Rhythm: Normal rate and regular rhythm.     Pulses: Normal pulses.     Heart sounds: Normal heart sounds.  Pulmonary:     Effort: Pulmonary effort is normal.     Breath sounds: Normal breath sounds.  Abdominal:     General: Bowel sounds are normal. There is no distension.     Palpations: Abdomen is soft. There is no mass.     Tenderness: There is no abdominal tenderness. There is no guarding.     Hernia: No hernia is present.  Musculoskeletal:     Right lower leg: Edema present.     Left lower leg: Edema present.  Skin:    General: Skin is warm.  Neurological:     General: No focal deficit present.     Mental Status: She is alert and oriented to person, place, and time.     Cranial Nerves: Cranial nerves 2-12 are intact.     Motor: Motor function is intact.  Psychiatric:        Attention and Perception: Attention normal.        Mood and Affect: Mood normal.        Speech: Speech normal.        Behavior: Behavior normal. Behavior is cooperative.         Cognition and Memory: Cognition normal.      Labs on Admission: I have personally reviewed following labs and imaging studies BMET Recent Labs  Lab 08/21/21 1551  NA 128*  K 3.0*  CL 95*  CO2 18*  BUN 46*  CREATININE 5.64*  GLUCOSE 99   Electrolytes Recent Labs  Lab 08/21/21 1551  CALCIUM 6.6*  MG 0.8*   Sepsis Markers No results for input(s): "LATICACIDVEN", "PROCALCITON", "O2SATVEN"  in the last 168 hours. ABG No results for input(s): "PHART", "PCO2ART", "PO2ART" in the last 168 hours. Liver Enzymes No results for input(s): "AST", "ALT", "ALKPHOS", "BILITOT", "ALBUMIN" in the last 168 hours. Cardiac Enzymes No results for input(s): "TROPONINI", "PROBNP" in the last 168 hours. No results found for: "DDIMER" Coag's No results for input(s): "APTT", "INR" in the last 168 hours.  No results found for this or any previous visit (from the past 240 hour(s)).   Current Facility-Administered Medications:    0.9 %  sodium chloride infusion, , Intravenous, Continuous, Lucrezia Starch, MD   calcium gluconate 1 g/ 50 mL sodium chloride IVPB, 1 g, Intravenous, Once, Lucrezia Starch, MD, Last Rate: 50 mL/hr at 08/21/21 1804, 1,000 mg at 08/21/21 1804   magnesium sulfate IVPB 2 g 50 mL, 2 g, Intravenous, Once, Lucrezia Starch, MD   pantoprazole (PROTONIX) injection 40 mg, 40 mg, Intravenous, Q12H, Para Skeans, MD   thiamine (B-1) injection 100 mg, 100 mg, Intravenous, Daily, Para Skeans, MD  Current Outpatient Medications:    amLODipine (NORVASC) 10 MG tablet, Take 1 tablet (10 mg total) by mouth daily., Disp: 30 tablet, Rfl: 1   ferrous sulfate 325 (65 FE) MG EC tablet, Take 325 mg by mouth every other day., Disp: , Rfl:    folic acid (FOLVITE) 1 MG tablet, Take 1 mg by mouth daily., Disp: , Rfl:    furosemide (LASIX) 20 MG tablet, Take 20 mg by mouth daily., Disp: , Rfl:   COVID-19 Labs No results for input(s): "DDIMER", "FERRITIN", "LDH", "CRP" in the last 72 hours. No  results found for: "SARSCOV2NAA"  Radiological Exams on Admission: DG Chest 2 View  Result Date: 08/21/2021 CLINICAL DATA:  Shortness of breath with dizziness for 2 days. EXAM: CHEST - 2 VIEW COMPARISON:  Radiographs 03/29/2018 and 02/13/2018. FINDINGS: The heart size and mediastinal contours are normal. The lungs are clear. There is no pleural effusion or pneumothorax. No acute osseous findings are identified. IMPRESSION: Stable chest.  No evidence of active cardiopulmonary process. Electronically Signed   By: Richardean Sale M.D.   On: 08/21/2021 17:03    EKG: Independently reviewed.  EKG shows sinus rhythm 91 prominent R waves, LVH, T wave inversions in leads III, aVF, V3, V4 V5. QTc of 509.      Assessment and Plan: * Dizziness Vitals:   08/21/21 1550 08/21/21 1552  BP: (!) 115/95 98/71  -Orthostatic vitals -Hold Lasix -Continue with IV fluids with normal saline at 125 cc/h -Monitor strict I's and O's -Fall precautions. -PT evaluation prior to discharge for safety assessment.    Metabolic acidosis - Metabolic acidosis with low bicarb lactic acid and alcohol level pending. Anion gap within normal limits. -We will consider adding sodium bicarb.  Acute kidney injury superimposed on chronic kidney disease ([place]) Lab Results  Component Value Date   CREATININE 5.64 (H) 08/21/2021   CREATININE 1.52 (H) 03/30/2018   CREATININE 1.67 (H) 03/29/2018  Significant worsening with acute kidney injury and GFR of 19 currently. Avoid any contrast. Renal ultrasound. Renal dose medications. Nephrology consult per a.m. team. CPK pending, alcohol level pending.    Hypomagnesemia We will replace and follow levels.   Hypokalemia Replaced in the ED and will follow levels.   Hyponatremia Hold diuretic therapy. Continue with normal saline IV rehydration.   SOB (shortness of breath) We will obtain a chest x-ray. BNP. 2D echocardiogram. Ambulatory pulse ox prior to discharge  per a.m. team.  Hypertension Blood pressure 98/71, pulse (!) 105, temperature 98.5 F (36.9 C), temperature source Oral, resp. rate 18, height 6' (1.829 m), weight 101.3 kg, SpO2 100 %.  Hold amlodipine and Lasix currently.   History of alcohol use disorder Ethanol level pending. IV thiamine daily. IV PPI. Fall precautions. CIWA assessment after physical exam.     DVT prophylaxis:  SCD's.  Code Status:  Full code    Family Communication:  Ashia, Dehner (Mother)  [phone] (Mobile)   Disposition Plan:  Home    Consults called:  None   Admission status: Inpatient.      Para Skeans MD [facility]  6 PM- 2 AM. Please contact me via secure Chat 6 PM-2 AM. [phone] ( Pager ) To contact the [facility] Attending or Consulting provider [place] or covering provider during after hours [place], for this patient.   Check the care team in [facility] and look for a) attending/consulting [facility] provider listed and b) the [facility] team listed Log into [email or web address] and use [place]'s universal password to access. If you do not have the password, please contact the hospital operator. Locate the [facility] provider you are looking for under [facility] and page to a number that you can be directly reached. If you still have difficulty reaching the provider, please page the [facility] (Director on Call) for the Hospitalists listed on amion for assistance. [email or web address] 08/21/2021, 7:04 PM

## 2021-08-21 NOTE — ED Provider Notes (Addendum)
[facility] Provider Note    Event Date/Time   First MD Initiated Contact with Patient 08/21/21 1557     (approximate)   History   Shortness of Breath and Dizziness   HPI  Megan Lambert is a 51 y.o. female with past with history of asthma, HTN, CKD and anemia having previously received blood transfusions most recently on 6/7 as well as some chronic swelling on the legs on Lasix who presents for evaluation of some lightheadedness and shortness of breath on standing that she started yesterday but then a little worse today.  She denies any chest pain, cough, fever, headache, earache, vision changes, spinning sensation, abdominal pain, back pain, vomiting, diarrhea, urinary symptoms rash or extremity pain.  No recent falls or injuries.  She notes only change in her medications as she was recently started on folate.  No recent GI or urinary bleeding or melanotic stool.  She notes she has had a little bit of vaginal spotting over the last couple days she is waiting to get into see gynecology outpatient.  She denies any tobacco abuse EtOH use or illicit drug use.  She denies any dizziness with head movement or when she is still or after she has gotten up and start walking.    Past Medical History:  Diagnosis Date   Asthma    Hypertension      Physical Exam  Triage Vital Signs: ED Triage Vitals  Enc Vitals Group     BP 08/21/21 1550 (!) 115/95     Pulse Rate 08/21/21 1550 (!) 105     Resp 08/21/21 1550 18     Temp 08/21/21 1550 98.5 F (36.9 C)     Temp Source 08/21/21 1550 Oral     SpO2 08/21/21 1550 100 %     Weight 08/21/21 1522 223 lb 5.2 oz (101.3 kg)     Height 08/21/21 1522 6' (1.829 m)     Head Circumference --      Peak Flow --      Pain Score 08/21/21 1522 0     Pain Loc --      Pain Edu? --      Excl. in [place]? --     Most recent vital signs: Vitals:   08/21/21 1550 08/21/21 1552  BP: (!) 115/95 98/71  Pulse: (!) 105   Resp: 18    Temp: 98.5 F (36.9 C)   SpO2: 100%     General: Awake, no distress.  CV:  Good peripheral perfusion.  Slight tachycardic.  2+ radial pulse. Resp:  Normal effort.  Bilaterally. Abd:  No distention.  Soft. Other:  Very mild lower extreme edema. Cranial nerves II through XII grossly intact.  No pronator drift.  No finger dysmetria.  Symmetric 5/5 strength of all extremities.  Sensation intact to light touch in all extremities.  Unremarkable unassisted gait.    ED Results / Procedures / Treatments  Labs (all labs ordered are listed, but only abnormal results are displayed) Labs Reviewed  BASIC METABOLIC PANEL - Abnormal; Notable for the following components:      Result Value   Sodium 128 (*)    Potassium 3.0 (*)    Chloride 95 (*)    CO2 18 (*)    BUN 46 (*)    Creatinine, Ser 5.64 (*)    Calcium 6.6 (*)    GFR, Estimated 9 (*)    All other components within normal limits  CBC - Abnormal;  Notable for the following components:   WBC 3.9 (*)    RBC 2.65 (*)    Hemoglobin 7.3 (*)    HCT 21.5 (*)    RDW 17.7 (*)    Platelets 143 (*)    All other components within normal limits  URINALYSIS, ROUTINE W REFLEX MICROSCOPIC - Abnormal; Notable for the following components:   Color, Urine AMBER (*)    APPearance CLOUDY (*)    Hgb urine dipstick LARGE (*)    Protein, ur 100 (*)    Leukocytes,Ua TRACE (*)    Bacteria, UA FEW (*)    All other components within normal limits  IRON AND TIBC - Abnormal; Notable for the following components:   TIBC 234 (*)    Saturation Ratios 43 (*)    All other components within normal limits  MAGNESIUM - Abnormal; Notable for the following components:   Magnesium 0.8 (*)    All other components within normal limits  SARS CORONAVIRUS 2 BY RT PCR  TSH  BRAIN NATRIURETIC PEPTIDE  ETHANOL  OCCULT BLOOD X 1 CARD TO LAB, STOOL  CK  CBG MONITORING, ED  TYPE AND SCREEN  TROPONIN I (HIGH SENSITIVITY)     EKG  EKG is remarkable sinus rhythm  with a ventricular rate of 91, QTc interval of 509, nonspecific ST change in inferior leads III and aVF without other clear evidence of acute ischemia or significant arrhythmia.   RADIOLOGY Chest reviewed by myself shows no focal consoidation, effusion, edema, pneumothorax or other clear acute thoracic process. I also reviewed radiology interpretation and agree with findings described.  Pelvic ultrasound my interpretation without free fluid or active bleeding.  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  Renal ultrasound my interpretation without evidence of hydronephrosis or stone.  I reviewed radiology interpretation and agree with the findings of echogenicity and mild hepatomegaly but no other acute process.   PROCEDURES:  Critical Care performed: No  Procedures    MEDICATIONS ORDERED IN ED: Medications  calcium gluconate 1 g/ 50 mL sodium chloride IVPB (1,000 mg Intravenous New Bag/Given 08/21/21 1804)  0.9 %  sodium chloride infusion (has no administration in time range)  magnesium sulfate IVPB 2 g 50 mL (has no administration in time range)  thiamine (B-1) injection 100 mg (has no administration in time range)  pantoprazole (PROTONIX) injection 40 mg (has no administration in time range)  lactated ringers bolus 1,000 mL (1,000 mLs Intravenous New Bag/Given 08/21/21 1802)  potassium chloride SA (KLOR-CON M) CR tablet 40 mEq (40 mEq Oral Given 08/21/21 1804)     IMPRESSION / MDM / ASSESSMENT AND PLAN / ED COURSE  I reviewed the triage vital signs and the nursing notes. Patient's presentation is most consistent with acute presentation with potential threat to life or bodily function.                               Differential diagnosis includes, but is not limited to possible orthostasis from dehydration, anemia, arrhythmia, effects of medications and overdiuresis.  No  history exam is not at this time suggestive of a CVA, trauma, toxic ingestion or acute infectious process.  I reviewed outside labs.  Patient's most CBC was from 6/7 with hemoglobin of 6.3.  His recent kidney function I can see is from 5/30 with a creatinine of  3.68 and GFR 14.  BMP today shows a creatinine of 5.64 representing an acute AKI on CKD with some elevation of the BUN at 46 and a K of 3.  Anion gap is within normal limits and patient is mildly hyponatremic at 128.  CBC shows WC count of 3.9, hemoglobin of 7.3 and platelets 143.  TSH WNL.  Magnesium 0.8.  EKG is remarkable sinus rhythm with a ventricular rate of 91, QTc interval of 509, nonspecific ST change in inferior leads III and aVF without other clear evidence of acute ischemia or significant arrhythmia.  Patient's chest pain expected QTc prolongation may be related to her electrolyte derangements and we will send a troponin as well.  Chest reviewed by myself shows no focal consoidation, effusion, edema, pneumothorax or other clear acute thoracic process. I also reviewed radiology interpretation and agree with findings described.  Renal and pelvic ultrasounds ordered.   Pelvic ultrasound my interpretation without free fluid or active bleeding.  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  Renal ultrasound my interpretation without evidence of hydronephrosis or stone.  I reviewed radiology interpretation and agree with the findings of echogenicity and mild hepatomegaly but no other acute process.    Patient will be admitted to medicine service for further evaluation and management.      FINAL CLINICAL IMPRESSION(S) / ED DIAGNOSES   Final diagnoses:  AKI (acute kidney injury) ([place])  Dizziness  Chronic anemia  Hypokalemia  Hyponatremia  Abnormal vaginal bleeding  Hypomagnesemia     Rx / DC Orders   ED Discharge  Orders     None        Note:  This document was prepared using Dragon voice recognition software and may include unintentional dictation errors.   Lucrezia Starch, MD 08/21/21 Randol K[place]    Lucrezia Starch, MD 08/21/21 [phone]

## 2021-08-21 NOTE — Assessment & Plan Note (Signed)
Ethanol level pending. IV thiamine daily. IV PPI. Fall precautions. CIWA assessment after physical exam.

## 2021-08-21 NOTE — Assessment & Plan Note (Signed)
We will obtain a chest x-ray. BNP. 2D echocardiogram. Ambulatory pulse ox prior to discharge per a.m. team.

## 2021-08-21 NOTE — ED Notes (Addendum)
Pt presents to the ED for dizziness and SOB that started yesterday. States she feels "out of it" or disoriented when she goes to stand up but when she when she is sitting still she is just fine. Denies any CP, but does endorses SOB with exertion with no chronic lung issues. Pt states also has been fatigued and has been more tired lately. States she noticed some spotting that was brown in nature that she noted couple days ago. Denies any clots. Pt has a hx of iron-deficiecy anemia and states she needed a blood transfusion about a month ago.   Pt is A&Ox4 and NAD. Ambulatory in the room. Family at bedside.

## 2021-08-21 NOTE — Assessment & Plan Note (Signed)
Hold diuretic therapy. Continue with normal saline IV rehydration.

## 2021-08-21 NOTE — Assessment & Plan Note (Signed)
We will replace and follow levels.

## 2021-08-21 NOTE — Assessment & Plan Note (Signed)
Vitals:   08/21/21 1550 08/21/21 1552  BP: (!) 115/95 98/71  -Orthostatic vitals -Hold Lasix -Continue with IV fluids with normal saline at 125 cc/h -Monitor strict I's and O's -Fall precautions. -PT evaluation prior to discharge for safety assessment.

## 2021-08-21 NOTE — ED Triage Notes (Signed)
C/O sob and dizziness x 2 days.  Patietn is AAOx3.  Skin warm and dry. NAD. No SOB/ DOE noted.

## 2021-08-21 NOTE — Assessment & Plan Note (Signed)
-   Metabolic acidosis with low bicarb lactic acid and alcohol level pending. Anion gap within normal limits. -We will consider adding sodium bicarb.

## 2021-08-21 NOTE — Assessment & Plan Note (Signed)
Lab Results  Component Value Date   CREATININE 5.64 (H) 08/21/2021   CREATININE 1.52 (H) 03/30/2018   CREATININE 1.67 (H) 03/29/2018  Significant worsening with acute kidney injury and GFR of 19 currently. Avoid any contrast. Renal ultrasound. Renal dose medications. Nephrology consult per a.m. team. CPK pending, alcohol level pending.

## 2021-08-21 NOTE — Assessment & Plan Note (Signed)
Blood pressure 98/71, pulse (!) 105, temperature 98.5 F (36.9 C), temperature source Oral, resp. rate 18, height 6' (1.829 m), weight 101.3 kg, SpO2 100 %.  Hold amlodipine and Lasix currently.

## 2021-08-21 NOTE — Assessment & Plan Note (Signed)
Replaced in the ED and will follow levels.

## 2021-08-22 ENCOUNTER — Observation Stay: Payer: Managed Care, Other (non HMO)

## 2021-08-22 DIAGNOSIS — Z6830 Body mass index (BMI) 30.0-30.9, adult: Secondary | ICD-10-CM | POA: Diagnosis not present

## 2021-08-22 DIAGNOSIS — E669 Obesity, unspecified: Secondary | ICD-10-CM | POA: Diagnosis present

## 2021-08-22 DIAGNOSIS — D631 Anemia in chronic kidney disease: Secondary | ICD-10-CM | POA: Diagnosis present

## 2021-08-22 DIAGNOSIS — N185 Chronic kidney disease, stage 5: Secondary | ICD-10-CM | POA: Diagnosis present

## 2021-08-22 DIAGNOSIS — E861 Hypovolemia: Secondary | ICD-10-CM | POA: Diagnosis present

## 2021-08-22 DIAGNOSIS — N179 Acute kidney failure, unspecified: Secondary | ICD-10-CM | POA: Diagnosis present

## 2021-08-22 DIAGNOSIS — F1010 Alcohol abuse, uncomplicated: Secondary | ICD-10-CM | POA: Diagnosis present

## 2021-08-22 DIAGNOSIS — E860 Dehydration: Secondary | ICD-10-CM | POA: Diagnosis present

## 2021-08-22 DIAGNOSIS — Z888 Allergy status to other drugs, medicaments and biological substances status: Secondary | ICD-10-CM | POA: Diagnosis not present

## 2021-08-22 DIAGNOSIS — Z79899 Other long term (current) drug therapy: Secondary | ICD-10-CM | POA: Diagnosis not present

## 2021-08-22 DIAGNOSIS — R42 Dizziness and giddiness: Secondary | ICD-10-CM | POA: Diagnosis not present

## 2021-08-22 DIAGNOSIS — E876 Hypokalemia: Secondary | ICD-10-CM | POA: Diagnosis present

## 2021-08-22 DIAGNOSIS — E871 Hypo-osmolality and hyponatremia: Secondary | ICD-10-CM | POA: Diagnosis present

## 2021-08-22 DIAGNOSIS — N2581 Secondary hyperparathyroidism of renal origin: Secondary | ICD-10-CM | POA: Diagnosis present

## 2021-08-22 DIAGNOSIS — I120 Hypertensive chronic kidney disease with stage 5 chronic kidney disease or end stage renal disease: Secondary | ICD-10-CM | POA: Diagnosis present

## 2021-08-22 DIAGNOSIS — J45909 Unspecified asthma, uncomplicated: Secondary | ICD-10-CM | POA: Diagnosis present

## 2021-08-22 DIAGNOSIS — Z20822 Contact with and (suspected) exposure to covid-19: Secondary | ICD-10-CM | POA: Diagnosis present

## 2021-08-22 DIAGNOSIS — M898X9 Other specified disorders of bone, unspecified site: Secondary | ICD-10-CM | POA: Diagnosis present

## 2021-08-22 DIAGNOSIS — N939 Abnormal uterine and vaginal bleeding, unspecified: Secondary | ICD-10-CM | POA: Diagnosis present

## 2021-08-22 DIAGNOSIS — E872 Acidosis, unspecified: Secondary | ICD-10-CM | POA: Diagnosis present

## 2021-08-22 LAB — CBC
HCT: 21.5 % — ABNORMAL LOW (ref 36.0–46.0)
Hemoglobin: 7.2 g/dL — ABNORMAL LOW (ref 12.0–15.0)
MCH: 26.9 pg (ref 26.0–34.0)
MCHC: 33.5 g/dL (ref 30.0–36.0)
MCV: 80.2 fL (ref 80.0–100.0)
Platelets: 153 10*3/uL (ref 150–400)
RBC: 2.68 MIL/uL — ABNORMAL LOW (ref 3.87–5.11)
RDW: 17.9 % — ABNORMAL HIGH (ref 11.5–15.5)
WBC: 3.2 10*3/uL — ABNORMAL LOW (ref 4.0–10.5)
nRBC: 0 % (ref 0.0–0.2)

## 2021-08-22 LAB — COMPREHENSIVE METABOLIC PANEL
ALT: 28 U/L (ref 0–44)
AST: 51 U/L — ABNORMAL HIGH (ref 15–41)
Albumin: 4 g/dL (ref 3.5–5.0)
Alkaline Phosphatase: 52 U/L (ref 38–126)
Anion gap: 12 (ref 5–15)
BUN: 44 mg/dL — ABNORMAL HIGH (ref 6–20)
CO2: 20 mmol/L — ABNORMAL LOW (ref 22–32)
Calcium: 6.8 mg/dL — ABNORMAL LOW (ref 8.9–10.3)
Chloride: 99 mmol/L (ref 98–111)
Creatinine, Ser: 5.04 mg/dL — ABNORMAL HIGH (ref 0.44–1.00)
GFR, Estimated: 10 mL/min — ABNORMAL LOW (ref >60)
Glucose, Bld: 92 mg/dL (ref 70–99)
Potassium: 3 mmol/L — ABNORMAL LOW (ref 3.5–5.1)
Sodium: 131 mmol/L — ABNORMAL LOW (ref 135–145)
Total Bilirubin: 1 mg/dL (ref 0.3–1.2)
Total Protein: 7.2 g/dL (ref 6.5–8.1)

## 2021-08-22 LAB — LACTIC ACID, PLASMA
Lactic Acid, Venous: 0.8 mmol/L (ref 0.5–1.9)
Lactic Acid, Venous: 0.9 mmol/L (ref 0.5–1.9)

## 2021-08-22 LAB — T4, FREE: Free T4: 1.2 ng/dL — ABNORMAL HIGH (ref 0.61–1.12)

## 2021-08-22 LAB — MAGNESIUM: Magnesium: 1.4 mg/dL — ABNORMAL LOW (ref 1.7–2.4)

## 2021-08-22 LAB — BRAIN NATRIURETIC PEPTIDE: B Natriuretic Peptide: 41.1 pg/mL (ref 0.0–100.0)

## 2021-08-22 LAB — HEMOGLOBIN A1C
Hgb A1c MFr Bld: 5.3 % (ref 4.8–5.6)
Mean Plasma Glucose: 105.41 mg/dL

## 2021-08-22 LAB — HIV ANTIBODY (ROUTINE TESTING W REFLEX): HIV Screen 4th Generation wRfx: NONREACTIVE

## 2021-08-22 MED ORDER — MAGNESIUM SULFATE 2 GM/50ML IV SOLN
2.0000 g | Freq: Once | INTRAVENOUS | Status: AC
  Administered 2021-08-22: 2 g via INTRAVENOUS
  Filled 2021-08-22: qty 50

## 2021-08-22 MED ORDER — POTASSIUM CHLORIDE CRYS ER 20 MEQ PO TBCR
40.0000 meq | EXTENDED_RELEASE_TABLET | Freq: Once | ORAL | Status: AC
  Administered 2021-08-22: 40 meq via ORAL
  Filled 2021-08-22: qty 2

## 2021-08-22 MED ORDER — MAGNESIUM OXIDE -MG SUPPLEMENT 400 (240 MG) MG PO TABS
400.0000 mg | ORAL_TABLET | Freq: Two times a day (BID) | ORAL | Status: DC
  Administered 2021-08-22 – 2021-08-23 (×3): 400 mg via ORAL
  Filled 2021-08-22 (×3): qty 1

## 2021-08-22 NOTE — Plan of Care (Signed)

## 2021-08-22 NOTE — Consult Note (Signed)
[facility]  CONSULT NOTE    Date: 08/22/2021                  Patient Name:  Megan Lambert  MRN: [id number]  DOB: [birth year]  Age / Sex: 51 y.o., female         PCP: [facility] [place]                 Service Requesting Consult: [place]                 Reason for Consult: Acute kidney injury            History of Present Illness: Megan Lambert is a 51 y.o.  female with past medical history including hypertension and chronic kidney disease, who was admitted to [facility] on 08/21/2021 for Hypokalemia [E87.6] Hypomagnesemia [E83.42] Dizziness [R42] Hyponatremia [E87.1] Vaginal bleeding [N93.9] Abnormal vaginal bleeding [N93.9] Chronic anemia [D64.9] AKI (acute kidney injury) ([place]) [N17.9]   Patient presents to emergency department with complaints of shortness of breath and dizziness. She is followed by [facility] nephrology and was last seen on 07/02/21. At that time, baseline creatinine was 3.68 and GFR 14. Patient currently takes furosemide for lower extremity edema. States she has continued to take all medications as prescribed. 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 Reports not drinking enough water during the day while working. Denies known fever and chills. Denies nausea, vomiting, and diarrhea.   Labs on ED arrival include sodium 128, potassium 3.0, serum bicarb 18, BUN 46, creatinine 5.64 with GFR 9, and magnesium 0.8.  Hemoglobin 7.3 with decreased red blood cells 3.9.  UA appears cloudy with hematuria and moderate proteinuria.  Chest x-ray negative for acute findings.  Renal ultrasound negative for obstruction.  Medications: Outpatient medications: Medications Prior to Admission  Medication Sig Dispense Refill Last Dose   amLODipine (NORVASC) 10 MG tablet Take 1 tablet (10 mg total) by mouth daily. 30 tablet 1 08/21/2021 at 1100   ferrous sulfate 325 (65 FE) MG EC tablet Take 325 mg by mouth every other day.    3/97/6734 at 1937   folic acid (FOLVITE) 1 MG tablet Take 1 mg by mouth daily.   08/21/2021 at 1100   furosemide (LASIX) 20 MG tablet Take 20 mg by mouth daily.   08/21/2021 at 1100    Current medications: Current Facility-Administered Medications  Medication Dose Route Frequency Provider Last Rate Last Admin   0.9 %  sodium chloride infusion   Intravenous Continuous Kc, Ramesh, MD 100 mL/hr at 08/22/21 1139 Rate Change at 08/22/21 1139   acetaminophen (TYLENOL) tablet 650 mg  650 mg Oral Q6H PRN Para Skeans, MD       Or   acetaminophen (TYLENOL) suppository 650 mg  650 mg Rectal Q6H PRN Para Skeans, MD       hydrALAZINE (APRESOLINE) injection 10 mg  10 mg Intravenous Q6H PRN Para Skeans, MD       magnesium oxide (MAG-OX) tablet 400 mg  400 mg Oral BID Kc, Ramesh, MD   400 mg at 08/22/21 0915   morphine (PF) 2 MG/ML injection 2 mg  2 mg Intravenous Q4H PRN Para Skeans, MD       pantoprazole (PROTONIX) injection 40 mg  40 mg Intravenous Q12H Florina Ou V, MD   40 mg at 08/22/21 0912   sodium chloride flush (NS) 0.9 % injection 3 mL  3  mL Intravenous Q12H Para Skeans, MD       thiamine (B-1) injection 100 mg  100 mg Intravenous Daily Para Skeans, MD   100 mg at 08/22/21 7824      Allergies: Allergies  Allergen Reactions   Lisinopril Swelling      Past Medical History: Past Medical History:  Diagnosis Date   Asthma    Hypertension      Past Surgical History: Past Surgical History:  Procedure Laterality Date   TUBAL LIGATION       Family History: No family history on file.   Social History: Social History   Socioeconomic History   Marital status: Single    Spouse name: Not on file   Number of children: Not on file   Years of education: Not on file   Highest education level: Not on file  Occupational History   Not on file  Tobacco Use   Smoking status: Never   Smokeless tobacco: Never  Vaping Use   Vaping Use: Never used  Substance and Sexual  Activity   Alcohol use: Yes    Comment: occasionally   Drug use: No   Sexual activity: Not on file  Other Topics Concern   Not on file  Social History Narrative   Not on file   Social Determinants of Health   Financial Resource Strain: Not on file  Food Insecurity: Not on file  Transportation Needs: Not on file  Physical Activity: Not on file  Stress: Not on file  Social Connections: Not on file  Intimate Partner Violence: Not on file     Review of Systems: Review of Systems  Constitutional:  Negative for chills, fever and malaise/fatigue.  HENT:  Negative for congestion, sore throat and tinnitus.   Eyes:  Negative for blurred vision and redness.  Respiratory:  Positive for shortness of breath. Negative for cough and wheezing.   Cardiovascular:  Negative for chest pain, palpitations, claudication and leg swelling.  Gastrointestinal:  Negative for abdominal pain, blood in stool, diarrhea, nausea and vomiting.  Genitourinary:  Negative for flank pain, frequency and hematuria.  Musculoskeletal:  Negative for back pain, falls and myalgias.  Skin:  Negative for rash.  Neurological:  Negative for dizziness, weakness and headaches.  Endo/Heme/Allergies:  Does not bruise/bleed easily.  Psychiatric/Behavioral:  Negative for depression. The patient is not nervous/anxious and does not have insomnia.     Vital Signs: Blood pressure (!) 88/64, pulse 100, temperature 98 F (36.7 C), resp. rate 16, height 6' (1.829 m), weight 101.3 kg, SpO2 100 %.  Weight trends: Filed Weights   08/21/21 1522  Weight: 101.3 kg    Physical Exam: General: NAD, resting comfortably  Head: Normocephalic, atraumatic. Moist oral mucosal membranes  Eyes: Anicteric  Lungs:  Clear to auscultation, normal effort, room air  Heart: Regular rate and rhythm  Abdomen:  Soft, nontender, nondistended  Extremities: Trace peripheral edema.  Neurologic: Nonfocal, moving all four extremities  Skin: No lesions   Access: None     Lab results: Basic Metabolic Panel: Recent Labs  Lab 08/21/21 1551 08/22/21 0445  NA 128* 131*  K 3.0* 3.0*  CL 95* 99  CO2 18* 20*  GLUCOSE 99 92  BUN 46* 44*  CREATININE 5.64* 5.04*  CALCIUM 6.6* 6.8*  MG 0.8* 1.4*    Liver Function Tests: Recent Labs  Lab 08/22/21 0445  AST 51*  ALT 28  ALKPHOS 52  BILITOT 1.0  PROT 7.2  ALBUMIN 4.0  No results for input(s): "LIPASE", "AMYLASE" in the last 168 hours. No results for input(s): "AMMONIA" in the last 168 hours.  CBC: Recent Labs  Lab 08/21/21 1551 08/22/21 0445  WBC 3.9* 3.2*  HGB 7.3* 7.2*  HCT 21.5* 21.5*  MCV 81.1 80.2  PLT 143* 153    Cardiac Enzymes: Recent Labs  Lab 08/21/21 1700  CKTOTAL 205    BNP: Invalid input(s): "POCBNP"  CBG: No results for input(s): "GLUCAP" in the last 168 hours.  Microbiology: Results for orders placed or performed during the hospital encounter of 08/21/21  SARS Coronavirus 2 by RT PCR (hospital order, performed in [facility] hospital lab) *cepheid single result test* Anterior Nasal Swab     Status: None   Collection Time: 08/21/21  5:59 PM   Specimen: Anterior Nasal Swab  Result Value Ref Range Status   SARS Coronavirus 2 by RT PCR NEGATIVE NEGATIVE Final    Comment: (NOTE) SARS-CoV-2 target nucleic acids are NOT DETECTED.  The SARS-CoV-2 RNA is generally detectable in upper and lower respiratory specimens during the acute phase of infection. The lowest concentration of SARS-CoV-2 viral copies this assay can detect is 250 copies / mL. A negative result does not preclude SARS-CoV-2 infection and should not be used as the sole basis for treatment or other patient management decisions.  A negative result may occur with improper specimen collection / handling, submission of specimen other than nasopharyngeal swab, presence of viral mutation(s) within the areas targeted by this assay, and inadequate number of viral copies (<250 copies /  mL). A negative result must be combined with clinical observations, patient history, and epidemiological information.  Fact Sheet for Patients:   https://www.patel.info/  Fact Sheet for Healthcare Providers: https://hall.com/  This test is not yet approved or  cleared by the [place] FDA and has been authorized for detection and/or diagnosis of SARS-CoV-2 by FDA under an Emergency Use Authorization (EUA).  This EUA will remain in effect (meaning this test can be used) for the duration of the COVID-19 declaration under Section 564(b)(1) of the Act, 21 U.S.C. section 360bbb-3(b)(1), unless the authorization is terminated or revoked sooner.  Performed at [facility], [address] [place], [place] [address]     Coagulation Studies: No results for input(s): "LABPROT", "INR" in the last 72 hours.  Urinalysis: Recent Labs    08/21/21 1759  COLORURINE AMBER*  LABSPEC 1.017  PHURINE 5.0  GLUCOSEU NEGATIVE  HGBUR LARGE*  BILIRUBINUR NEGATIVE  KETONESUR NEGATIVE  PROTEINUR 100*  NITRITE NEGATIVE  LEUKOCYTESUR TRACE*      Imaging: US Renal  Result Date: 08/21/2021 CLINICAL DATA:  Acute renal insufficiency EXAM: RENAL / URINARY TRACT ULTRASOUND COMPLETE COMPARISON:  03/30/2006 FINDINGS: Right Kidney: Renal measurements: 8.9 x 6.6 x 4.2 cm = volume: 129 mL. Renal cortical echogenicity is diffusely increased suggesting underlying medical renal disease. Preserved cortical thickness. No hydronephrosis. No intrarenal masses or calcifications are seen. Simple cortical cyst is seen within the upper pole exophytically measuring up to 2 cm. No follow-up imaging is recommended for this lesion. Left Kidney: Renal measurements: 9.6 x 5.1 x 4.2 cm = volume: 108 mL. Renal cortical echogenicity is diffusely increased suggesting underlying medical renal disease. Renal cortical thickness is preserved. No hydronephrosis. No intrarenal masses  or calcifications are seen. Simple exophytic cortical cyst arises from the interpolar region of the left kidney measuring up to 17 mm. No follow-up imaging is recommended for this lesion. Bladder: Appears normal for degree of bladder  distention. Other: Mild hepatomegaly and probable changes of hepatic steatosis are noted. IMPRESSION: 1. Diffusely increased renal cortical echogenicity suggesting underlying medical renal disease. 2. Mild hepatomegaly and probable hepatic steatosis. Electronically Signed   By: Fidela Salisbury M.D.   On: 08/21/2021 19:45   US PELVIC COMPLETE WITH TRANSVAGINAL  Result Date: 08/21/2021 CLINICAL DATA:  Vaginal bleeding for 2 days. EXAM: TRANSABDOMINAL AND TRANSVAGINAL ULTRASOUND OF PELVIS TECHNIQUE: Both transabdominal and transvaginal ultrasound examinations of the pelvis were performed. Transabdominal technique was performed for global imaging of the pelvis including uterus, ovaries, adnexal regions, and pelvic cul-de-sac. It was necessary to proceed with endovaginal exam following the transabdominal exam to visualize the endometrium. COMPARISON:  None Available. FINDINGS: Uterus Measurements: 4.8 x 2.9 x 3.7 cm = volume: 27 mL. No fibroids or other mass visualized. Endometrium Thickness: 12.6 mm.  No focal abnormality visualized. Right ovary Measurements: Not identified. Left ovary Measurements: 8.7 x 6.1 x 6.7 cm = volume: 183 mL. Circumscribed heterogeneous in echogenicity mass replaces the left ovary. It contains intramural fat. Other findings There is a heterogeneous mass posterior to the cervix which measures 9.1 x 7.3 x 0.3 cm. No clear distinction from the uterus is seen, however the origin of this mass is uncertain. IMPRESSION: Large heterogeneous mass posterior to the cervix with uncertain origin. Further evaluation with contrast-enhanced pelvic MRI may be considered. 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 The right ovary was not seen. Electronically Signed   By: Fidela Salisbury M.D.   On: 08/21/2021 19:37   DG Chest 2 View  Result Date: 08/21/2021 CLINICAL DATA:  Shortness of breath with dizziness for 2 days. EXAM: CHEST - 2 VIEW COMPARISON:  Radiographs 03/29/2018 and 02/13/2018. FINDINGS: The heart size and mediastinal contours are normal. The lungs are clear. There is no pleural effusion or pneumothorax. No acute osseous findings are identified. IMPRESSION: Stable chest.  No evidence of active cardiopulmonary process. Electronically Signed   By: Richardean Sale M.D.   On: 08/21/2021 17:03     Assessment & Plan: Ms. Geanna Divirgilio is a 51 y.o.  female with past medical history including hypertension and chronic kidney diseas, who was admitted to [facility] on 08/21/2021 for Hypokalemia [E87.6] Hypomagnesemia [E83.42] Dizziness [R42] Hyponatremia [E87.1] Vaginal bleeding [N93.9] Abnormal vaginal bleeding [N93.9] Chronic anemia [D64.9] AKI (acute kidney injury) ([place]) [N17.9]   Acute kidney injury with hypokalemia/hypomagnesia on chronic kidney disease stage V.  Baseline creatinine appears to be 3.68 with GFR 14 on 07/02/2021.  Patient followed by [facility] nephrology.  No IV contrast exposure.  Renal ultrasound negative for obstruction.  Acute kidney injury likely secondary to dehydration and hypovolemia from continued diuretic use.  Creatinine greatly reduced on admission with GFR 9.  Creatinine slightly improved with IV fluids, 10%.  Continue IV fluids at this time.  Discussed these findings with patient and informed her the need for dialysis in the near future.  Dialysis was offered to patient however patient would prefer to follow-up with [facility] nephrology and make considerations at that time.  Patient currently having no uremic symptoms at this time.  We will continue to monitor patient during hospitalization however patient cleared to discharge with follow-up with [facility].  Potassium and magnesium supplementation ordered by primary team.  2. Anemia of chronic kidney  disease Lab Results  Component Value Date   HGB 7.2 (L) 08/22/2021    Hemoglobin below desired target.  We will continue to monitor and defer need for blood transfusion to primary team.  3. Secondary Hyperparathyroidism:  Lab Results  Component Value Date   CALCIUM 6.8 (L) 08/22/2021    Calcium below desired target.  Calcium gluconate 1 g ordered by primary team.  LOS: 0 Bree Heinzelman 7/20/20231:53 PM

## 2021-08-22 NOTE — Progress Notes (Signed)
PROGRESS NOTE Megan Lambert  ELF:[id number] DOB: 1970-10-20 DOA: 08/21/2021 PCP: [facility]   Brief Narrative/Hospital Course: 51 year old female with-asthma, hypertension, CKD stage V secondary to chronic hypertension with discarding of kidney, baseline creatinine around 5-6, proteinuria/albuminuria followed by [facility] nephrology Dr. 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 Patient received calcium gluconate IV fluids, magnesium sulfate thiamine Protonix.  She was admitted for further management     Subjective: Seen and examined this morning, says she reports she feels somewhat better this morning.  Orthostatic this am.No nausea vomiting chest pain shortness of breath fever chills.   Assessment and Plan: Principal Problem:   Dizziness Active Problems:   Metabolic acidosis   Acute kidney injury superimposed on chronic kidney disease (HCC)   History of alcohol use disorder   Hypertension   SOB (shortness of breath)   Hyponatremia   Hypokalemia   Hypomagnesemia   CKD (chronic kidney disease), stage V (HCC)   Dizziness/weakness/shortness of breath: Multifactorial-with CKD,anemia, orthostasis-Checked orthostatics-blood pressure did drop up to 88/64 on standing, sitting 97, lying 107.Continue PT OT eval. continue gentle IV fluid hydration.cxr clear on  admission.    Metabolic acidosis: In the setting of CKD. CKD V:Baseline creatinine 5-6 followed by [facility] nephrology nephrology consulted here-no nausea vomiting respirations are clear and not needing oxygen Metabolic bone disease monitor calcium and phosphorus.  Recent PTH level IP 215 a month ago, vitamin D is 15.5  Anemia of chronic renal disease needing transfusion last 07/11/22 FOR hemoglobin of 6.3 g.  Transfuse if less than 7 g.  Some of the symptoms could be explained by her anemia. Recent Labs  Lab 08/21/21 1551 08/22/21 0445  HGB 7.3* 7.2*  HCT 21.5* 21.5*    History of alcohol use disorder-cessation counseling Hypertension BP controlled.  At home on amlodipine and Lasix Hyponatremia: In the setting of CKD Hypokalemia: Replete  Hypomagnesemia-repleted, recheck Class I Obesity:Patient's Body mass index is 30.29 kg/m. : Will benefit with PCP follow-up, weight loss  healthy lifestyle and outpatient sleep evaluation.  DVT prophylaxis: SCDs Start: 08/21/21 2223 Code Status:   Code Status: Full Code Family Communication: plan of care discussed with patient at bedside. Patient status is: Admitted as observation, but remains hospitalized because of ongoing need for management of her orthostatic blood pressure, CKD, dizziness PT OT eval nephrology Level of care: Telemetry Medical   Dispo: The patient is from: home            Anticipated disposition: TBD in 24 hrs  Mobility Assessment (last 72 hours)     Mobility Assessment     Row Name 08/22/21 0745 08/22/21 0609 08/22/21 0608       Does patient have an order for bedrest or is patient medically unstable No - Continue assessment No - Continue assessment No - Continue assessment     What is the highest level of mobility based on the progressive mobility assessment? Level 5 (Walks  with assist in room/hall) - Balance while stepping forward/back and can walk in room with assist - Complete Level 6 (Walks independently in room and hall) -  Balance while walking in room without assist - Complete Level 6 (Walks independently in room and hall) - Balance while walking in room without assist - Complete               Objective: Vitals last 24 hrs: Vitals:   08/22/21 0804 08/22/21 0935 08/22/21 0937 08/22/21 0940  BP: 98/70 107/73 97/77 (!) 88/64  Pulse: 90 97 (!) 105 100  Resp: 16 16 16 16   Temp: 98 F (36.7 C) 98 F (36.7 C) 98 F (36.7 C) 98 F (36.7 C)  TempSrc:      SpO2: 100% 100% 100% 100%  Weight:      Height:       Weight change:   Physical Examination: General exam: alert awake, pleasant, older than stated age, weak appearing. HEENT:Oral mucosa moist, Ear/Nose WNL grossly, dentition normal. Respiratory system: bilaterally clear BS, no use of accessory muscle Cardiovascular system: S1 & S2 +, No JVD. Gastrointestinal system: Abdomen soft,NT,ND, BS+ Nervous System:Alert, awake, moving extremities and grossly nonfocal Extremities: LE edema neg,distal peripheral pulses palpable.  Skin: No rashes,no icterus. MSK: Normal muscle bulk,tone, power  Medications reviewed:  Scheduled Meds:  magnesium oxide  400 mg Oral BID   pantoprazole (PROTONIX) IV  40 mg Intravenous Q12H   sodium chloride flush  3 mL Intravenous Q12H   thiamine injection  100 mg Intravenous Daily   Continuous Infusions:  sodium chloride 125 mL/hr at 08/22/21 3818      Diet Order             Diet renal with fluid restriction Fluid restriction: 1200 mL Fluid; Room service appropriate? Yes; Fluid consistency: Thin  Diet effective now                  Intake/Output Summary (Last 24 hours) at 08/22/2021 1123 Last data filed at 08/21/2021 2105 Gross per 24 hour  Intake 1092.06 ml  Output --  Net 1092.06 ml   Net IO Since Admission: 1,092.06 mL [08/22/21 1123]  Wt Readings from Last 3 Encounters:  08/21/21 101.3 kg  03/30/18 101.3 kg  02/13/18 72.6 kg     Unresulted Labs (From admission, onward)     Start     Ordered    08/23/21 2993  Basic metabolic panel  Daily at 5am,   R     Question:  Specimen collection method  Answer:  Lab=Lab collect   08/22/21 0735   08/23/21 0500  CBC  Daily at 5am,   R     Question:  Specimen collection method  Answer:  Lab=Lab collect   08/22/21 0735   08/22/21 0500  HIV Antibody (routine testing w rflx)  Once,   R        08/22/21 0500   08/21/21 1850  Occult blood card to lab, stool RN will collect  ONCE - STAT,   URGENT       Question:  Specimen to be collected by:  Answer:  RN will collect   08/21/21 1849          Data Reviewed: I have personally reviewed following labs and imaging studies CBC: Recent Labs  Lab 08/21/21 1551 08/22/21 0445  WBC 3.9* 3.2*  HGB 7.3* 7.2*  HCT 21.5* 21.5*  MCV 81.1 80.2  PLT 143* 716   Basic Metabolic Panel: Recent  Labs  Lab 08/21/21 1551 08/22/21 0445  NA 128* 131*  K 3.0* 3.0*  CL 95* 99  CO2 18* 20*  GLUCOSE 99 92  BUN 46* 44*  CREATININE 5.64* 5.04*  CALCIUM 6.6* 6.8*  MG 0.8* 1.4*   GFR: Estimated Creatinine Clearance: 17.4 mL/min (A) (by C-G formula based on SCr of 5.04 mg/dL (H)). Liver Function Tests: Recent Labs  Lab 08/22/21 0445  AST 51*  ALT 28  ALKPHOS 52  BILITOT 1.0  PROT 7.2  ALBUMIN 4.0   No results for input(s): "LIPASE", "AMYLASE" in the last 168 hours. No results for input(s): "AMMONIA" in the last 168 hours. Coagulation Profile: No results for input(s): "INR", "PROTIME" in the last 168 hours. BNP (last 3 results) No results for input(s): "PROBNP" in the last 8760 hours. HbA1C: Recent Labs    08/22/21 0445  HGBA1C 5.3   CBG: No results for input(s): "GLUCAP" in the last 168 hours. Lipid Profile: No results for input(s): "CHOL", "HDL", "LDLCALC", "TRIG", "CHOLHDL", "LDLDIRECT" in the last 72 hours. Thyroid Function Tests: Recent Labs    08/21/21 1551 08/21/21 2111  TSH 2.985  --   FREET4  --  1.20*   Sepsis Labs: Recent Labs  Lab 08/22/21 0138 08/22/21 0445   LATICACIDVEN 0.8 0.9    Recent Results (from the past 240 hour(s))  SARS Coronavirus 2 by RT PCR (hospital order, performed in [facility] hospital lab) *cepheid single result test* Anterior Nasal Swab     Status: None   Collection Time: 08/21/21  5:59 PM   Specimen: Anterior Nasal Swab  Result Value Ref Range Status   SARS Coronavirus 2 by RT PCR NEGATIVE NEGATIVE Final    Comment: (NOTE) SARS-CoV-2 target nucleic acids are NOT DETECTED.  The SARS-CoV-2 RNA is generally detectable in upper and lower respiratory specimens during the acute phase of infection. The lowest concentration of SARS-CoV-2 viral copies this assay can detect is 250 copies / mL. A negative result does not preclude SARS-CoV-2 infection and should not be used as the sole basis for treatment or other patient management decisions.  A negative result may occur with improper specimen collection / handling, submission of specimen other than nasopharyngeal swab, presence of viral mutation(s) within the areas targeted by this assay, and inadequate number of viral copies (<250 copies / mL). A negative result must be combined with clinical observations, patient history, and epidemiological information.  Fact Sheet for Patients:   https://www.patel.info/  Fact Sheet for Healthcare Providers: https://hall.com/  This test is not yet approved or  cleared by the [place] FDA and has been authorized for detection and/or diagnosis of SARS-CoV-2 by FDA under an Emergency Use Authorization (EUA).  This EUA will remain in effect (meaning this test can be used) for the duration of the COVID-19 declaration under Section 564(b)(1) of the Act, 21 U.S.C. section 360bbb-3(b)(1), unless the authorization is terminated or revoked sooner.  Performed at [facility], [address], [place], [place] [address]     Antimicrobials: Anti-infectives (From admission, onward)     None      Culture/Microbiology    Component Value Date/Time   SDES  07/06/2017 1658    URINE, RANDOM Performed at [facility], [address] [place]el[place], [place] [address]    [facility]  07/06/2017 1658    NONE Performed at [facility], [address] [place], [place] [address]    CULT MULTIPLE SPECIES PRESENT, SUGGEST RECOLLECTION (A) 07/06/2017 1658  REPTSTATUS 07/07/2017 FINAL 07/06/2017 1658    Other culture-see note  Radiology Studies: US Renal  Result Date: 08/21/2021 CLINICAL DATA:  Acute renal insufficiency EXAM: RENAL / URINARY TRACT ULTRASOUND COMPLETE COMPARISON:  03/30/2006 FINDINGS: Right Kidney: Renal measurements: 8.9 x 6.6 x 4.2 cm = volume: 129 mL. Renal cortical echogenicity is diffusely increased suggesting underlying medical renal disease. Preserved cortical thickness. No hydronephrosis. No intrarenal masses or calcifications are seen. Simple cortical cyst is seen within the upper pole exophytically measuring up to 2 cm. No follow-up imaging is recommended for this lesion. Left Kidney: Renal measurements: 9.6 x 5.1 x 4.2 cm = volume: 108 mL. Renal cortical echogenicity is diffusely increased suggesting underlying medical renal disease. Renal cortical thickness is preserved. No hydronephrosis. No intrarenal masses or calcifications are seen. Simple exophytic cortical cyst arises from the interpolar region of the left kidney measuring up to 17 mm. No follow-up imaging is recommended for this lesion. Bladder: Appears normal for degree of bladder distention. Other: Mild hepatomegaly and probable changes of hepatic steatosis are noted. IMPRESSION: 1. Diffusely increased renal cortical echogenicity suggesting underlying medical renal disease. 2. Mild hepatomegaly and probable hepatic steatosis. Electronically Signed   By: Fidela Salisbury M.D.   On: 08/21/2021 19:45   US PELVIC COMPLETE WITH TRANSVAGINAL  Result Date: 08/21/2021 CLINICAL DATA:  Vaginal  bleeding for 2 days. EXAM: TRANSABDOMINAL AND TRANSVAGINAL ULTRASOUND OF PELVIS TECHNIQUE: Both transabdominal and transvaginal ultrasound examinations of the pelvis were performed. Transabdominal technique was performed for global imaging of the pelvis including uterus, ovaries, adnexal regions, and pelvic cul-de-sac. It was necessary to proceed with endovaginal exam following the transabdominal exam to visualize the endometrium. COMPARISON:  None Available. FINDINGS: Uterus Measurements: 4.8 x 2.9 x 3.7 cm = volume: 27 mL. No fibroids or other mass visualized. Endometrium Thickness: 12.6 mm.  No focal abnormality visualized. Right ovary Measurements: Not identified. Left ovary Measurements: 8.7 x 6.1 x 6.7 cm = volume: 183 mL. Circumscribed heterogeneous in echogenicity mass replaces the left ovary. It contains intramural fat. Other findings There is a heterogeneous mass posterior to the cervix which measures 9.1 x 7.3 x 0.3 cm. No clear distinction from the uterus is seen, however the origin of this mass is uncertain. IMPRESSION: Large heterogeneous mass posterior to the cervix with uncertain origin. Further evaluation with contrast-enhanced pelvic MRI may be considered. 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 The right ovary was not seen. Electronically Signed   By: Fidela Salisbury M.D.   On: 08/21/2021 19:37   DG Chest 2 View  Result Date: 08/21/2021 CLINICAL DATA:  Shortness of breath with dizziness for 2 days. EXAM: CHEST - 2 VIEW COMPARISON:  Radiographs 03/29/2018 and 02/13/2018. FINDINGS: The heart size and mediastinal contours are normal. The lungs are clear. There is no pleural effusion or pneumothorax. No acute osseous findings are identified. IMPRESSION: Stable chest.  No evidence of active cardiopulmonary process. Electronically Signed   By: Richardean Sale M.D.   On: 08/21/2021 17:03     LOS: 0 days   Antonieta Pert, MD [facility]  08/22/2021, 11:23 AM

## 2021-08-22 NOTE — Progress Notes (Signed)
   08/22/21 0454  Assess: MEWS Score  Temp 98.4 F (36.9 C)  BP 119/85  MAP (mmHg) 94  Pulse Rate (!) 101  Resp 10  SpO2 100 %  O2 Device Room Air  Assess: MEWS Score  MEWS Temp 0  MEWS Systolic 0  MEWS Pulse 1  MEWS RR 1  MEWS LOC 0  MEWS Score 2  MEWS Score Color Yellow  Assess: if the MEWS score is Yellow or Red  Were vital signs taken at a resting state? No  Focused Assessment No change from prior assessment  Does the patient meet 2 or more of the SIRS criteria? No  MEWS guidelines implemented *See Row Information* No, previously yellow, continue vital signs every 4 hours  Treat  MEWS Interventions Other (Comment) (allowed patient to rest)  Pain Scale 0-10  Pain Score 0  Notify: Charge Nurse/RN  Name of Charge Nurse/RN Notified Dorie, RN  Date Charge Nurse/RN Notified 08/22/21  Time Charge Nurse/RN Notified 0515  Document  Patient Outcome Other (Comment) (stabil without interventions)  Progress note created (see row info) Yes  Assess: SIRS CRITERIA  SIRS Temperature  0  SIRS Pulse 1  SIRS Respirations  0  SIRS WBC 1  SIRS Score Sum  2

## 2021-08-22 NOTE — Hospital Course (Addendum)
51 year old female with-asthma, hypertension, CKD stage V secondary to chronic hypertension with discarding of kidney, baseline creatinine around 5-6, proteinuria/albuminuria followed by [facility] nephrology Dr. 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 Patient received calcium gluconate IV fluids, magnesium sulfate thiamine Protonix.  She was admitted for further management Patient was orthostatic managed IV fluid hydration with.  Her baseline creatinine appears to be around 3.6 with EGFR of 14 was in the 5-6 range on admission.  Overnight hydration creatinine has improved, again had anemia and wanted PRBC ordered for 7/21.  Patient requesting to go home after blood transfusion follow-up with her [facility] nephrology and PCP as outpatient

## 2021-08-23 DIAGNOSIS — R42 Dizziness and giddiness: Secondary | ICD-10-CM | POA: Diagnosis not present

## 2021-08-23 LAB — CBC
HCT: 18.5 % — ABNORMAL LOW (ref 36.0–46.0)
Hemoglobin: 6 g/dL — ABNORMAL LOW (ref 12.0–15.0)
MCH: 26.8 pg (ref 26.0–34.0)
MCHC: 32.4 g/dL (ref 30.0–36.0)
MCV: 82.6 fL (ref 80.0–100.0)
Platelets: 143 10*3/uL — ABNORMAL LOW (ref 150–400)
RBC: 2.24 MIL/uL — ABNORMAL LOW (ref 3.87–5.11)
RDW: 17.2 % — ABNORMAL HIGH (ref 11.5–15.5)
WBC: 2 10*3/uL — ABNORMAL LOW (ref 4.0–10.5)
nRBC: 0 % (ref 0.0–0.2)

## 2021-08-23 LAB — BASIC METABOLIC PANEL
Anion gap: 7 (ref 5–15)
BUN: 43 mg/dL — ABNORMAL HIGH (ref 6–20)
CO2: 22 mmol/L (ref 22–32)
Calcium: 6.7 mg/dL — ABNORMAL LOW (ref 8.9–10.3)
Chloride: 105 mmol/L (ref 98–111)
Creatinine, Ser: 4.63 mg/dL — ABNORMAL HIGH (ref 0.44–1.00)
GFR, Estimated: 11 mL/min — ABNORMAL LOW (ref >60)
Glucose, Bld: 87 mg/dL (ref 70–99)
Potassium: 3.4 mmol/L — ABNORMAL LOW (ref 3.5–5.1)
Sodium: 134 mmol/L — ABNORMAL LOW (ref 135–145)

## 2021-08-23 LAB — PREPARE RBC (CROSSMATCH)

## 2021-08-23 LAB — ABO/RH: ABO/RH(D): O NEG

## 2021-08-23 LAB — MAGNESIUM: Magnesium: 1.7 mg/dL (ref 1.7–2.4)

## 2021-08-23 LAB — HEMOGLOBIN: Hemoglobin: 7.8 g/dL — ABNORMAL LOW (ref 12.0–15.0)

## 2021-08-23 MED ORDER — SODIUM CHLORIDE 0.9% IV SOLUTION: Freq: Once | INTRAVENOUS | Status: AC

## 2021-08-23 MED ORDER — POTASSIUM CHLORIDE CRYS ER 20 MEQ PO TBCR
20.0000 meq | EXTENDED_RELEASE_TABLET | Freq: Once | ORAL | Status: AC
Start: 2021-08-23 — End: 2021-08-23
  Administered 2021-08-23: 20 meq via ORAL
  Filled 2021-08-23: qty 1

## 2021-08-23 NOTE — Clinical Social Work Note (Signed)
  Transition of Care [facility]) Screening Note   Patient Details  Name: Letita Prentiss Date of Birth: 03-16-1970   Transition of Care [facility]) CM/SW Contact:    Eileen Stanford, LCSW Phone [phone] 08/23/2021, 12:21 PM    Transition of Care Department [facility]) has reviewed patient and no TOC needs have been identified at this time. We will continue to monitor patient advancement through interdisciplinary progression rounds. If new patient transition needs arise, please place a TOC consult.

## 2021-08-23 NOTE — Progress Notes (Signed)
MD notified about pt. Hgb and morning labs.  Will continue to monitor.

## 2021-08-23 NOTE — Progress Notes (Signed)
PT Cancellation Note  Patient Details Name: Megan Lambert MRN: [id number] DOB: [birth year]   Cancelled Treatment:    Reason Eval/Treat Not Completed: PT screened, no needs identified, will sign off. PT spoke with RN, pt has been up and moving independently this hospital stay. Pt also endorsed that she has no mobility concerns and feels she has returned to her baseline. No acute PT needs indicated. PT to sign off. Please reconsult PT if pt status changes or acute needs are identified.   Lieutenant Diego PT, DPT 3:13 PM,08/23/21

## 2021-08-23 NOTE — Progress Notes (Signed)
[facility]  ROUNDING NOTE   Subjective:   Patient seated at side of bed States she feels well Tolerating meals without nausea and vomiting Denies shortness of breath  Creatinine 4.63   Objective:  Vital signs in last 24 hours:  Temp:  [97.6 F (36.4 C)-98.9 F (37.2 C)] 98 F (36.7 C) (07/21 1318) Pulse Rate:  [86-100] 94 (07/21 1318) Resp:  [15-17] 17 (07/21 1318) BP: (85-130)/(57-91) 124/88 (07/21 1318) SpO2:  [100 %] 100 % (07/21 1318)  Weight change:  Filed Weights   08/21/21 1522  Weight: 101.3 kg    Intake/Output: I/O last 3 completed shifts: In: 3689.7 [P.O.:480; I.V.:2117.6; IV Piggyback:1092.1] Out: -    Intake/Output this shift:  Total I/O In: 736 [P.O.:360; Blood:376] Out: -   Physical Exam: General: NAD  Head: Normocephalic, atraumatic. Moist oral mucosal membranes  Eyes: Anicteric  Lungs:  Clear to auscultation, normal effort, room air  Heart: Regular rate and rhythm  Abdomen:  Soft, nontender  Extremities:  No peripheral edema.  Neurologic: Nonfocal, moving all four extremities  Skin: No lesions  Access: None    Basic Metabolic Panel: Recent Labs  Lab 08/21/21 1551 08/22/21 0445 08/23/21 0446  NA 128* 131* 134*  K 3.0* 3.0* 3.4*  CL 95* 99 105  CO2 18* 20* 22  GLUCOSE 99 92 87  BUN 46* 44* 43*  CREATININE 5.64* 5.04* 4.63*  CALCIUM 6.6* 6.8* 6.7*  MG 0.8* 1.4* 1.7    Liver Function Tests: Recent Labs  Lab 08/22/21 0445  AST 51*  ALT 28  ALKPHOS 52  BILITOT 1.0  PROT 7.2  ALBUMIN 4.0   No results for input(s): "LIPASE", "AMYLASE" in the last 168 hours. No results for input(s): "AMMONIA" in the last 168 hours.  CBC: Recent Labs  Lab 08/21/21 1551 08/22/21 0445 08/23/21 0446  WBC 3.9* 3.2* 2.0*  HGB 7.3* 7.2* 6.0*  HCT 21.5* 21.5* 18.5*  MCV 81.1 80.2 82.6  PLT 143* 153 143*    Cardiac Enzymes: Recent Labs  Lab 08/21/21 1700  CKTOTAL 205    BNP: Invalid input(s): "POCBNP"  CBG: No  results for input(s): "GLUCAP" in the last 168 hours.  Microbiology: Results for orders placed or performed during the hospital encounter of 08/21/21  SARS Coronavirus 2 by RT PCR (hospital order, performed in [facility] hospital lab) *cepheid single result test* Anterior Nasal Swab     Status: None   Collection Time: 08/21/21  5:59 PM   Specimen: Anterior Nasal Swab  Result Value Ref Range Status   SARS Coronavirus 2 by RT PCR NEGATIVE NEGATIVE Final    Comment: (NOTE) SARS-CoV-2 target nucleic acids are NOT DETECTED.  The SARS-CoV-2 RNA is generally detectable in upper and lower respiratory specimens during the acute phase of infection. The lowest concentration of SARS-CoV-2 viral copies this assay can detect is 250 copies / mL. A negative result does not preclude SARS-CoV-2 infection and should not be used as the sole basis for treatment or other patient management decisions.  A negative result may occur with improper specimen collection / handling, submission of specimen other than nasopharyngeal swab, presence of viral mutation(s) within the areas targeted by this assay, and inadequate number of viral copies (<250 copies / mL). A negative result must be combined with clinical observations, patient history, and epidemiological information.  Fact Sheet for Patients:   https://www.patel.info/  Fact Sheet for Healthcare Providers: https://hall.com/  This test is not yet approved or  cleared by the  [place] [employer] and has been authorized for detection and/or diagnosis of SARS-CoV-2 by FDA under an Print production planner (EUA).  This EUA will remain in effect (meaning this test can be used) for the duration of the COVID-19 declaration under Section 564(b)(1) of the Act, 21 U.S.C. section 360bbb-3(b)(1), unless the authorization is terminated or revoked sooner.  Performed at [facility], [address]  [place], [place] [address]     Coagulation Studies: No results for input(s): "LABPROT", "INR" in the last 72 hours.  Urinalysis: Recent Labs    08/21/21 1759  COLORURINE AMBER*  LABSPEC 1.017  PHURINE 5.0  GLUCOSEU NEGATIVE  HGBUR LARGE*  BILIRUBINUR NEGATIVE  KETONESUR NEGATIVE  PROTEINUR 100*  NITRITE NEGATIVE  LEUKOCYTESUR TRACE*      Imaging: US Renal  Result Date: 08/21/2021 CLINICAL DATA:  Acute renal insufficiency EXAM: RENAL / URINARY TRACT ULTRASOUND COMPLETE COMPARISON:  03/30/2006 FINDINGS: Right Kidney: Renal measurements: 8.9 x 6.6 x 4.2 cm = volume: 129 mL. Renal cortical echogenicity is diffusely increased suggesting underlying medical renal disease. Preserved cortical thickness. No hydronephrosis. No intrarenal masses or calcifications are seen. Simple cortical cyst is seen within the upper pole exophytically measuring up to 2 cm. No follow-up imaging is recommended for this lesion. Left Kidney: Renal measurements: 9.6 x 5.1 x 4.2 cm = volume: 108 mL. Renal cortical echogenicity is diffusely increased suggesting underlying medical renal disease. Renal cortical thickness is preserved. No hydronephrosis. No intrarenal masses or calcifications are seen. Simple exophytic cortical cyst arises from the interpolar region of the left kidney measuring up to 17 mm. No follow-up imaging is recommended for this lesion. Bladder: Appears normal for degree of bladder distention. Other: Mild hepatomegaly and probable changes of hepatic steatosis are noted. IMPRESSION: 1. Diffusely increased renal cortical echogenicity suggesting underlying medical renal disease. 2. Mild hepatomegaly and probable hepatic steatosis. Electronically Signed   By: Fidela Salisbury M.D.   On: 08/21/2021 19:45   US PELVIC COMPLETE WITH TRANSVAGINAL  Result Date: 08/21/2021 CLINICAL DATA:  Vaginal bleeding for 2 days. EXAM: TRANSABDOMINAL AND TRANSVAGINAL ULTRASOUND OF PELVIS TECHNIQUE: Both transabdominal and  transvaginal ultrasound examinations of the pelvis were performed. Transabdominal technique was performed for global imaging of the pelvis including uterus, ovaries, adnexal regions, and pelvic cul-de-sac. It was necessary to proceed with endovaginal exam following the transabdominal exam to visualize the endometrium. COMPARISON:  None Available. FINDINGS: Uterus Measurements: 4.8 x 2.9 x 3.7 cm = volume: 27 mL. No fibroids or other mass visualized. Endometrium Thickness: 12.6 mm.  No focal abnormality visualized. Right ovary Measurements: Not identified. Left ovary Measurements: 8.7 x 6.1 x 6.7 cm = volume: 183 mL. Circumscribed heterogeneous in echogenicity mass replaces the left ovary. It contains intramural fat. Other findings There is a heterogeneous mass posterior to the cervix which measures 9.1 x 7.3 x 0.3 cm. No clear distinction from the uterus is seen, however the origin of this mass is uncertain. IMPRESSION: Large heterogeneous mass posterior to the cervix with uncertain origin. Further evaluation with contrast-enhanced pelvic MRI may be considered. 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 The right ovary was not seen. Electronically Signed   By: Fidela Salisbury M.D.   On: 08/21/2021 19:37   DG Chest 2 View  Result Date: 08/21/2021 CLINICAL DATA:  Shortness of breath with dizziness for 2 days. EXAM: CHEST - 2 VIEW COMPARISON:  Radiographs 03/29/2018 and 02/13/2018. FINDINGS: The heart size and mediastinal contours are normal. The lungs are clear. There is no pleural  effusion or pneumothorax. No acute osseous findings are identified. IMPRESSION: Stable chest.  No evidence of active cardiopulmonary process. Electronically Signed   By: Richardean Sale M.D.   On: 08/21/2021 17:03     Medications:    sodium chloride 100 mL/hr at 08/23/21 9390    magnesium oxide  400 mg Oral BID   pantoprazole (PROTONIX) IV  40 mg Intravenous Q12H   sodium chloride flush  3 mL Intravenous Q12H   thiamine  injection  100 mg Intravenous Daily   acetaminophen **OR** acetaminophen, hydrALAZINE, morphine injection  Assessment/ Plan:  [name] is a 51 y.o.  female with past medical history including hypertension and chronic kidney diseas, who was admitted to [facility] on 08/21/2021 for Hypokalemia [E87.6] Hypomagnesemia [E83.42] Dizziness [R42] Hyponatremia [E87.1] Vaginal bleeding [N93.9] Abnormal vaginal bleeding [N93.9] Chronic anemia [D64.9] AKI (acute kidney injury) ([place]) [N17.9]   Acute kidney injury with hypokalemia/hypomagnesia on chronic kidney disease stage V.  Baseline creatinine appears to be 3.68 with GFR 14 on 07/02/2021.  Patient followed by [facility] nephrology.  No IV contrast exposure.  Renal ultrasound negative for obstruction.  Acute kidney injury likely secondary to dehydration and hypovolemia from continued diuretic use.    Creatinine continues to respond well to IVF. Patient continues to request to follow up with [facility] for possible dialysis initiation. We are fine with this. We will monitor patient while she is in hospital.   Lab Results  Component Value Date   CREATININE 4.63 (H) 08/23/2021   CREATININE 5.04 (H) 08/22/2021   CREATININE 5.64 (H) 08/21/2021    Intake/Output Summary (Last 24 hours) at 08/23/2021 1332 Last data filed at 08/23/2021 1318 Gross per 24 hour  Intake 3333.63 ml  Output --  Net 3333.63 ml   2. Anemia of chronic kidney disease Lab Results  Component Value Date   HGB 6.0 (L) 08/23/2021    Hgb below target. 1 unit blood transfusion ordered by primary team.   3. Secondary Hyperparathyroidism: Lab Results  Component Value Date   CALCIUM 6.7 (L) 08/23/2021    Calcium remains decreased, despite supplementation yesterday.    LOS: 1 Oakley Kossman 7/21/20231:32 PM

## 2021-08-23 NOTE — Plan of Care (Signed)
  Problem: Nutrition: Goal: Adequate nutrition will be maintained Outcome: Progressing   Problem: Elimination: Goal: Will not experience complications related to bowel motility Outcome: Progressing   Problem: Coping: Goal: Level of anxiety will decrease Outcome: Not Progressing

## 2021-08-23 NOTE — Progress Notes (Signed)
IV/ telemetry discontinue, discharge paperwork explain and given to patient and mother.  Questions address by reaching to MD. They express no concerns at this time.

## 2021-08-23 NOTE — Discharge Summary (Signed)
Physician Discharge Summary  Megan Lambert KVQ:[id number] DOB: 1970/02/06 DOA: 08/21/2021  PCP: [facility] [place] date: 08/21/2021 Discharge date: 08/23/21 Recommendations for Outpatient Follow-up:  Follow up with PCP/nephrology in 1 weeks-call for appointment Please obtain BMP/CBC in one week  Discharge Dispo: home Discharge Condition: Stable Code Status:   Code Status: Prior Diet recommendation:  Diet Order     None        Brief/Interim Summary: 51 year old female with-asthma, hypertension, CKD stage V secondary to chronic hypertension with discarding of kidney, baseline creatinine around 5-6, proteinuria/albuminuria followed by [facility] nephrology Dr. 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 Patient received calcium gluconate IV fluids, magnesium sulfate thiamine Protonix.  She was admitted for further management Patient was orthostatic managed IV fluid hydration with.  Her baseline creatinine appears to be around 3.6 with EGFR of 14 was in the 5-6 range on admission.  Overnight hydration creatinine has improved, again had anemia and wanted PRBC ordered for 7/21.  Patient requesting to go home after blood transfusion follow-up with her [facility] nephrology and PCP as outpatient Repeat H&H came back about 7 g patient requesting for discharge and discharged home  in stable condition  Discharge Diagnoses:  Principal Problem:   Dizziness Active Problems:   Metabolic acidosis   Acute kidney injury superimposed on chronic kidney disease ([place])   History of alcohol use disorder   Hypertension   SOB (shortness of breath)   Hyponatremia   Hypokalemia   Hypomagnesemia   CKD (chronic kidney disease), stage V (HCC)  Dizziness/weakness/shortness of breath: Multifactorial-with CKD,anemia, orthostasis blood pressure did drop up to 88/64 on standing, sitting 97, lying 107.given IV fluid hydration amlodipine held.  At this time symptoms resolved.  She will follow-up with PCP    Metabolic acidosis: In the setting of CKD. AKI on CKD V:Baseline creatinine 3.8 per nephro- creat now down to 4.6 w/ ivf.  Followed by [facility] nephrolog-no nausea vomiting respirations are clear and not needing oxygen-patient requesting for discharge after blood transfusion today. Metabolic bone disease monitor calcium and phosphorus.  Recent PTH level IP 215 a month ago, vitamin D is 15.5  Recent Labs  Lab 08/21/21 1551 08/22/21 0445 08/23/21 0446  BUN 46* 44* 43*  CREATININE 5.64* 5.04* 4.63*    Anemia of chronic renal disease needing transfusion last 07/11/22 FOR hemoglobin of 6.3 g s/p 1 unit prbc.Posttransfusion hemoglobin 7.8 g and stable for discharge Recent Labs  Lab 08/21/21 1551 08/22/21 0445 08/23/21 0446 08/23/21 1358  HGB 7.3* 7.2* 6.0* 7.8*  HCT 21.5* 21.5* 18.5*  --       History of alcohol use disorder-cessation counseling Hypertension BP has been soft hypotensive amlodipine has been held, resume Lasix upon discharge.  Follow-up with PCP to resume antihypertensive.  Hyponatremia: In the setting of CKD, stable Recent Labs  Lab 08/21/21 1551 08/22/21 0445 08/23/21 0446  NA 128* 131* 134*    Hypokalemia: Repleted.  Hypomagnesemia-repleted Class I Obesity:Patient's Body mass index is 30.29 kg/m. : Will benefit with PCP follow-up, weight loss  healthy  lifestyle and outpatient sleep evaluation.  Consults: Nephrology  Subjective: Alert awake oriented no more dizziness.  Requesting for discharge home after blood transfusion today.  Discharge Exam: Vitals:   08/23/21 1227 08/23/21 1318  BP: 118/83 124/88  Pulse: 93 94  Resp: 16 17  Temp: 98.1 F (36.7 C) 98 F (36.7 C)  SpO2: 100% 100%   General: Pt is alert, awake, not in acute distress Cardiovascular: RRR, S1/S2 +, no rubs, no gallops Respiratory: CTA bilaterally, no wheezing, no rhonchi Abdominal: Soft, NT, ND, bowel sounds + Extremities: no edema, no cyanosis  Discharge Instructions  Discharge Instructions     Discharge instructions   Complete by: As directed    Follow-up with [facility] nephrology, repeat CBC and BMP in 5 to 7 days. Holding blood pressure medication until seen by your PCP  Please call call MD or return to ER for similar or worsening recurring problem that brought you to hospital or if any fever,nausea/vomiting,abdominal pain, uncontrolled pain, chest pain,  shortness of breath or any other alarming symptoms.  Please follow-up your doctor as instructed in a week time and call the office for appointment.  Please avoid alcohol, smoking, or any other illicit substance and maintain healthy habits including taking your regular medications as prescribed.  You were cared for by a hospitalist during your hospital stay. If you have any questions about your discharge medications or the care you received while you were in the hospital after you are discharged, you can call the unit and ask to speak with the hospitalist on call if the hospitalist that took care of you is not available.  Once you are discharged, your primary care physician will handle any further medical issues. Please note that NO REFILLS for any discharge medications will be authorized once you are discharged, as it is imperative that you return to your primary care physician (or establish a relationship  with a primary care physician if you do not have one) for your aftercare needs so that they can reassess your need for medications and monitor your lab values   Increase activity slowly   Complete by: As directed       Allergies as of 08/23/2021       Reactions   Lisinopril Swelling        Medication List     STOP taking these medications    amLODipine 10 MG tablet Commonly known as: NORVASC       TAKE these medications    ferrous sulfate 325 (65 FE) MG EC tablet Take 325 mg by mouth every other day.   folic acid 1 MG tablet Commonly known as: FOLVITE Take 1 mg by mouth daily.   furosemide 20 MG tablet Commonly known as: LASIX Take 20 mg by mouth daily.        [place], [employer]. Go in 1 week(s).   Why: Appointment on Friday, 09/06/21 at 8:40am. This is the first available appointment Contact information: [place] [address] [phone]                Allergies  Allergen Reactions   Lisinopril Swelling    The results of significant diagnostics from this hospitalization (including imaging, microbiology, ancillary and laboratory) are listed below for reference.    Microbiology: Recent Results (from the past 240 hour(s))  SARS Coronavirus 2 by RT PCR (hospital order, performed in [facility] hospital lab) *cepheid single result test* Anterior Nasal Swab     Status: None   Collection Time: 08/21/21  5:59 PM   Specimen: Anterior Nasal Swab  Result Value Ref Range Status   SARS Coronavirus 2 by RT PCR NEGATIVE NEGATIVE Final    Comment: (NOTE) SARS-CoV-2 target nucleic acids are NOT DETECTED.  The SARS-CoV-2 RNA is generally detectable in upper and lower respiratory specimens during the acute phase of infection. The lowest concentration of SARS-CoV-2 viral copies this assay can detect is 250 copies / mL. A negative result does not preclude SARS-CoV-2 infection and should not be used as the  sole basis for treatment or other patient management decisions.  A negative result may occur with improper specimen collection / handling, submission of specimen other than nasopharyngeal swab, presence of viral mutation(s) within the areas targeted by this assay, and inadequate number of viral copies (<250 copies / mL). A negative result must be combined with clinical observations, patient history, and epidemiological information.  Fact Sheet for Patients:   https://www.patel.info/  Fact Sheet for Healthcare Providers: https://hall.com/  This test is not yet approved or  cleared by the [place] FDA and has been authorized for detection and/or diagnosis of SARS-CoV-2 by FDA under an Emergency Use Authorization (EUA).  This EUA will remain in effect (meaning this test can be used) for the duration of the COVID-19 declaration under Section 564(b)(1) of the Act, 21 U.S.C. section 360bbb-3(b)(1), unless the authorization is terminated or revoked sooner.  Performed at [facility], [address] [place], [place] [address]     Procedures/Studies: US Renal  Result Date: 08/21/2021 CLINICAL DATA:  Acute renal insufficiency EXAM: RENAL / URINARY TRACT ULTRASOUND COMPLETE COMPARISON:  03/30/2006 FINDINGS: Right Kidney: Renal measurements: 8.9 x 6.6 x 4.2 cm = volume: 129 mL. Renal cortical echogenicity is diffusely increased suggesting underlying medical renal disease. Preserved cortical thickness. No hydronephrosis. No intrarenal masses or calcifications are seen. Simple cortical cyst is seen within the upper pole exophytically measuring up to 2 cm. No follow-up imaging is recommended for this lesion. Left Kidney: Renal measurements: 9.6 x 5.1 x 4.2 cm = volume: 108 mL. Renal cortical echogenicity is diffusely increased suggesting underlying medical renal disease. Renal cortical thickness is preserved. No hydronephrosis. No intrarenal  masses or calcifications are seen. Simple exophytic cortical cyst arises from the interpolar region of the left kidney measuring up to 17 mm. No follow-up imaging is recommended for this lesion. Bladder: Appears normal for degree of bladder distention. Other: Mild hepatomegaly and probable changes of hepatic steatosis are noted. IMPRESSION: 1. Diffusely increased renal cortical echogenicity suggesting underlying medical renal disease. 2. Mild hepatomegaly and probable hepatic steatosis. Electronically Signed   By: Fidela Salisbury M.D.   On: 08/21/2021 19:45   US PELVIC COMPLETE WITH TRANSVAGINAL  Result Date: 08/21/2021 CLINICAL DATA:  Vaginal bleeding for 2 days. EXAM: TRANSABDOMINAL AND TRANSVAGINAL ULTRASOUND OF PELVIS TECHNIQUE: Both transabdominal and transvaginal ultrasound examinations of the pelvis were performed. Transabdominal technique was performed for global imaging of the pelvis including uterus, ovaries, adnexal regions, and pelvic cul-de-sac. It was necessary to proceed with endovaginal exam following the transabdominal exam to visualize the endometrium. COMPARISON:  None Available. FINDINGS: Uterus Measurements: 4.8 x 2.9 x 3.7 cm = volume: 27 mL. No fibroids or other mass visualized. Endometrium Thickness: 12.6 mm.  No focal abnormality visualized. Right ovary Measurements: Not identified. Left ovary  Measurements: 8.7 x 6.1 x 6.7 cm = volume: 183 mL. Circumscribed heterogeneous in echogenicity mass replaces the left ovary. It contains intramural fat. Other findings There is a heterogeneous mass posterior to the cervix which measures 9.1 x 7.3 x 0.3 cm. No clear distinction from the uterus is seen, however the origin of this mass is uncertain. IMPRESSION: Large heterogeneous mass posterior to the cervix with uncertain origin. Further evaluation with contrast-enhanced pelvic MRI may be considered. 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 The right ovary was not seen. Electronically Signed   By:  Fidela Salisbury M.D.   On: 08/21/2021 19:37   DG Chest 2 View  Result Date: 08/21/2021 CLINICAL DATA:  Shortness of breath with dizziness for 2 days. EXAM: CHEST - 2 VIEW COMPARISON:  Radiographs 03/29/2018 and 02/13/2018. FINDINGS: The heart size and mediastinal contours are normal. The lungs are clear. There is no pleural effusion or pneumothorax. No acute osseous findings are identified. IMPRESSION: Stable chest.  No evidence of active cardiopulmonary process. Electronically Signed   By: Richardean Sale M.D.   On: 08/21/2021 17:03    Labs: BNP (last 3 results) Recent Labs    08/21/21 1551 08/22/21 0138  BNP 37.8 38.4   Basic Metabolic Panel: Recent Labs  Lab 08/21/21 1551 08/22/21 0445 08/23/21 0446  NA 128* 131* 134*  K 3.0* 3.0* 3.4*  CL 95* 99 105  CO2 18* 20* 22  GLUCOSE 99 92 87  BUN 46* 44* 43*  CREATININE 5.64* 5.04* 4.63*  CALCIUM 6.6* 6.8* 6.7*  MG 0.8* 1.4* 1.7   Liver Function Tests: Recent Labs  Lab 08/22/21 0445  AST 51*  ALT 28  ALKPHOS 52  BILITOT 1.0  PROT 7.2  ALBUMIN 4.0   No results for input(s): "LIPASE", "AMYLASE" in the last 168 hours. No results for input(s): "AMMONIA" in the last 168 hours. CBC: Recent Labs  Lab 08/21/21 1551 08/22/21 0445 08/23/21 0446 08/23/21 1358  WBC 3.9* 3.2* 2.0*  --   HGB 7.3* 7.2* 6.0* 7.8*  HCT 21.5* 21.5* 18.5*  --   MCV 81.1 80.2 82.6  --   PLT 143* 153 143*  --    Cardiac Enzymes: Recent Labs  Lab 08/21/21 1700  CKTOTAL 205   BNP: Invalid input(s): "POCBNP" CBG: No results for input(s): "GLUCAP" in the last 168 hours. D-Dimer No results for input(s): "DDIMER" in the last 72 hours. Hgb A1c Recent Labs    08/22/21 0445  HGBA1C 5.3   Lipid Profile No results for input(s): "CHOL", "HDL", "LDLCALC", "TRIG", "CHOLHDL", "LDLDIRECT" in the last 72 hours. Thyroid function studies Recent Labs    08/21/21 1551  TSH 2.985   Anemia work up Recent Labs    08/21/21 1551  TIBC 234*   IRON 101   Urinalysis    Component Value Date/Time   COLORURINE AMBER (A) 08/21/2021 1759   APPEARANCEUR CLOUDY (A) 08/21/2021 1759   LABSPEC 1.017 08/21/2021 1759   PHURINE 5.0 08/21/2021 1759   GLUCOSEU NEGATIVE 08/21/2021 1759   HGBUR LARGE (A) 08/21/2021 1759   BILIRUBINUR NEGATIVE 08/21/2021 1759   KETONESUR NEGATIVE 08/21/2021 1759   PROTEINUR 100 (A) 08/21/2021 1759   NITRITE NEGATIVE 08/21/2021 1759   LEUKOCYTESUR TRACE (A) 08/21/2021 1759   Sepsis Labs Recent Labs  Lab 08/21/21 1551 08/22/21 0445 08/23/21 0446  WBC 3.9* 3.2* 2.0*   Microbiology Recent Results (from the past 240 hour(s))  SARS Coronavirus 2 by RT PCR (hospital order, performed in [facility] hospital lab) *cepheid  single result test* Anterior Nasal Swab     Status: None   Collection Time: 08/21/21  5:59 PM   Specimen: Anterior Nasal Swab  Result Value Ref Range Status   SARS Coronavirus 2 by RT PCR NEGATIVE NEGATIVE Final    Comment: (NOTE) SARS-CoV-2 target nucleic acids are NOT DETECTED.  The SARS-CoV-2 RNA is generally detectable in upper and lower respiratory specimens during the acute phase of infection. The lowest concentration of SARS-CoV-2 viral copies this assay can detect is 250 copies / mL. A negative result does not preclude SARS-CoV-2 infection and should not be used as the sole basis for treatment or other patient management decisions.  A negative result may occur with improper specimen collection / handling, submission of specimen other than nasopharyngeal swab, presence of viral mutation(s) within the areas targeted by this assay, and inadequate number of viral copies (<250 copies / mL). A negative result must be combined with clinical observations, patient history, and epidemiological information.  Fact Sheet for Patients:   https://www.patel.info/  Fact Sheet for Healthcare Providers: https://hall.com/  This test is not yet  approved or  cleared by the [place] FDA and has been authorized for detection and/or diagnosis of SARS-CoV-2 by FDA under an Emergency Use Authorization (EUA).  This EUA will remain in effect (meaning this test can be used) for the duration of the COVID-19 declaration under Section 564(b)(1) of the Act, 21 U.S.C. section 360bbb-3(b)(1), unless the authorization is terminated or revoked sooner.  Performed at [facility], [address], [place], [place] [address]      Time coordinating discharge: 35 minutes  SIGNED: Antonieta Pert, MD  [facility] 08/24/2021, 9:03 AM  If 7PM-7AM, please contact night-coverage [email or web address]

## 2021-08-24 LAB — TYPE AND SCREEN
ABO/RH(D): O NEG
Antibody Screen: NEGATIVE
Unit division: 0

## 2021-08-24 LAB — BPAM RBC
Blood Product Expiration Date: 202307212359
ISSUE DATE / TIME: 202307210959
Unit Type and Rh: 9500

## 2022-04-27 DIAGNOSIS — R7303 Prediabetes: Secondary | ICD-10-CM | POA: Insufficient documentation

## 2022-05-30 DIAGNOSIS — D631 Anemia in chronic kidney disease: Secondary | ICD-10-CM | POA: Insufficient documentation

## 2022-09-18 ENCOUNTER — Emergency Department
Admission: EM | Admit: 2022-09-18 | Discharge: 2022-09-18 | Disposition: A | Discharge status: Home / Self Care | Payer: Managed Care, Other (non HMO) | Attending: Emergency Medicine | Admitting: Emergency Medicine

## 2022-09-18 ENCOUNTER — Other Ambulatory Visit: Payer: Self-pay

## 2022-09-18 DIAGNOSIS — I120 Hypertensive chronic kidney disease with stage 5 chronic kidney disease or end stage renal disease: Secondary | ICD-10-CM | POA: Insufficient documentation

## 2022-09-18 DIAGNOSIS — R000 Tachycardia, unspecified: Secondary | ICD-10-CM | POA: Insufficient documentation

## 2022-09-18 DIAGNOSIS — D631 Anemia in chronic kidney disease: Secondary | ICD-10-CM | POA: Insufficient documentation

## 2022-09-18 DIAGNOSIS — N185 Chronic kidney disease, stage 5: Secondary | ICD-10-CM | POA: Diagnosis not present

## 2022-09-18 DIAGNOSIS — R5383 Other fatigue: Secondary | ICD-10-CM | POA: Diagnosis present

## 2022-09-18 LAB — BASIC METABOLIC PANEL
Anion gap: 15 (ref 5–15)
BUN: 35 mg/dL — ABNORMAL HIGH (ref 6–20)
CO2: 14 mmol/L — ABNORMAL LOW (ref 22–32)
Calcium: 6.5 mg/dL — ABNORMAL LOW (ref 8.9–10.3)
Chloride: 103 mmol/L (ref 98–111)
Creatinine, Ser: 4.93 mg/dL — ABNORMAL HIGH (ref 0.44–1.00)
GFR, Estimated: 10 mL/min — ABNORMAL LOW (ref >60)
Glucose, Bld: 102 mg/dL — ABNORMAL HIGH (ref 70–99)
Potassium: 3.3 mmol/L — ABNORMAL LOW (ref 3.5–5.1)
Sodium: 132 mmol/L — ABNORMAL LOW (ref 135–145)

## 2022-09-18 LAB — CBC
HCT: 18.5 % — ABNORMAL LOW (ref 36.0–46.0)
Hemoglobin: 5.9 g/dL — ABNORMAL LOW (ref 12.0–15.0)
MCH: 26.7 pg (ref 26.0–34.0)
MCHC: 31.9 g/dL (ref 30.0–36.0)
MCV: 83.7 fL (ref 80.0–100.0)
Platelets: 185 10*3/uL (ref 150–400)
RBC: 2.21 MIL/uL — ABNORMAL LOW (ref 3.87–5.11)
RDW: 18.7 % — ABNORMAL HIGH (ref 11.5–15.5)
WBC: 8.5 10*3/uL (ref 4.0–10.5)
nRBC: 0 % (ref 0.0–0.2)

## 2022-09-18 LAB — PREPARE RBC (CROSSMATCH)

## 2022-09-18 MED ORDER — SODIUM CHLORIDE 0.9 % IV SOLN
10.0000 mL/h | Freq: Once | INTRAVENOUS | Status: AC
  Administered 2022-09-18: 10 mL/h via INTRAVENOUS

## 2022-09-18 NOTE — ED Triage Notes (Addendum)
Pt to ED via POV from home. Pt ambulatory to triage. Pt reports normally seen at [facility] for blood transfusions. Pt reports has not had transportation and feels her hemoglobin is low. Pt reports last transfusion was in May. Pt endorses dizziness

## 2022-09-18 NOTE — ED Notes (Signed)
ED Provider at bedside. 

## 2022-09-18 NOTE — Discharge Instructions (Addendum)
You were seen in the emergency department for anemia.  You received a blood transfusion since you had hemoglobin of 5.9.  Your heart rate was significantly elevated.  It was recommended that you be admitted to the hospital so we can recheck your hemoglobin level and possibly give a second unit of blood.  You stated that you wanted to go home and did not want to be admitted or wait after your blood transfusion.  You can return to the emergency department at any time. Please see your primary care physician tomorrow and if you are unable to be seen in a timely manner please return to the emergency department for hemoglobin recheck and possibly another blood transfusion.

## 2022-09-18 NOTE — ED Provider Notes (Addendum)
[facility] Provider Note    Event Date/Time   First MD Initiated Contact with Patient 09/18/22 1549     (approximate)   History   No chief complaint on file.   HPI  Megan Lambert is a 52 y.o. female past medical history significant for hypertension, CKD, anemia, who presents to the emergency department for fatigue and feeling like her hemoglobin level was low.  States that she last received a blood transfusion in May.  Today was at work and having feelings like she was generalized fatigue and that her blood counts were low.  Unable to get to [facility] where she normally gets transfusions.  Denies any significant shortness of breath or chest pain.  Denies any nausea, vomiting or diarrhea.  No blood in her stool.  History of anemia secondary to CKD.  Does state that she gets EPO shots intermittently from her nephrologist.  Denies being on anticoagulation.  States that she is always tachycardic.  States that she does not want to stay in the hospital after her blood transfusion and wants to go home.     Physical Exam   Triage Vital Signs: ED Triage Vitals [09/18/22 1215]  Encounter Vitals Group     BP 139/89     Systolic BP Percentile      Diastolic BP Percentile      Pulse Rate (!) 120     Resp 18     Temp 98.3 F (36.8 C)     Temp Source Oral     SpO2 100 %     Weight      Height      Head Circumference      Peak Flow      Pain Score 0     Pain Loc      Pain Education      Exclude from Growth Chart     Most recent vital signs: Vitals:   09/18/22 1900 09/18/22 1948  BP: (!) 145/106 (!) 130/91  Pulse: (!) 115 (!) 119  Resp: 16 14  Temp:  97.8 F (36.6 C)  SpO2: 100% 100%    Physical Exam Constitutional:      Appearance: She is well-developed.  HENT:     Head: Atraumatic.  Eyes:     Conjunctiva/sclera: Conjunctivae normal.  Cardiovascular:     Rate and Rhythm: Regular rhythm. Tachycardia present.  Pulmonary:     Effort: No  respiratory distress.  Abdominal:     General: There is no distension.     Tenderness: There is no abdominal tenderness.  Musculoskeletal:        General: Normal range of motion.     Cervical back: Normal range of motion.  Skin:    General: Skin is warm.     Capillary Refill: Capillary refill takes less than 2 seconds.  Neurological:     Mental Status: She is alert. Mental status is at baseline.     IMPRESSION / MDM / ASSESSMENT AND PLAN / ED COURSE  I reviewed the triage vital signs and the nursing notes.  Differential diagnosis including anemia of chronic disease, anemia secondary to kidney disease, GI bleed, electrolyte abnormality, dehydration.  EKG  I, Corena Herter, the attending physician, personally viewed and interpreted this ECG.   Rate: 115  Rhythm: Sinus tachycardia  Axis: Normal  Intervals: Normal  ST&T Change: None  Sinus tachycardia while on cardiac telemetry.   LABS (all labs ordered are listed, but only abnormal results  are displayed) Labs interpreted as -    Labs Reviewed  BASIC METABOLIC PANEL - Abnormal; Notable for the following components:      Result Value   Sodium 132 (*)    Potassium 3.3 (*)    CO2 14 (*)    Glucose, Bld 102 (*)    BUN 35 (*)    Creatinine, Ser 4.93 (*)    Calcium 6.5 (*)    GFR, Estimated 10 (*)    All other components within normal limits  CBC - Abnormal; Notable for the following components:   RBC 2.21 (*)    Hemoglobin 5.9 (*)    HCT 18.5 (*)    RDW 18.7 (*)    All other components within normal limits  TYPE AND SCREEN  PREPARE RBC (CROSSMATCH)     MDM  Hemoglobin of 5.9.  Creatinine at baseline at 4.93.  Elevated BUN at 35.  CO2 is 14.  K 3.3.   Patient was given a blood transfusion.  Discussed at length the needing to stay for admission, repeating hemoglobin and making sure he does not need another unit of PRBC.  Patient expressed understanding and understood the risk and benefits and states that she  wants to go home after blood transfusion.  States that she will follow-up with her primary care physician and with her renal doctor.  Patient continued to be tachycardic after blood transfusion.  States that she has an appointment tomorrow with her primary care physician and they are usually able to set her up for blood transfusion.  Does not want a wait for repeat hemoglobin check.  Patient signed out AGAINST MEDICAL ADVICE after receiving blood transfusion.  Continued to be tachycardic.  Discussed the risk and benefits of going home including continuing to have worsening drop of her hemoglobin level and could have worsening elevated heart rate, heart failure or cardiac arrest.  She expressed understanding.  Discussed return at any time for further workup.       PROCEDURES:  Critical Care performed: yes  .Critical Care  Performed by: Corena Herter, MD Authorized by: Corena Herter, MD   Critical care provider statement:    Critical care time (minutes):  30   Critical care time was exclusive of:  Separately billable procedures and treating other patients   Critical care was necessary to treat or prevent imminent or life-threatening deterioration of the following conditions: anemia, requiring blood transfusion.   Critical care was time spent personally by me on the following activities:  Development of treatment plan with patient or surrogate, discussions with consultants, evaluation of patient's response to treatment, examination of patient, ordering and review of laboratory studies, ordering and review of radiographic studies, ordering and performing treatments and interventions, pulse oximetry, re-evaluation of patient's condition and review of old charts   Patient's presentation is most consistent with acute presentation with potential threat to life or bodily function.   MEDICATIONS ORDERED IN ED: Medications  0.9 %  sodium chloride infusion (0 mL/hr Intravenous Stopped 09/18/22  1949)    FINAL CLINICAL IMPRESSION(S) / ED DIAGNOSES   Final diagnoses:  Anemia due to stage 5 chronic kidney disease, not on chronic dialysis (HCC)     Rx / DC Orders   ED Discharge Orders     None        Note:  This document was prepared using Dragon voice recognition software and may include unintentional dictation errors.   Corena Herter, MD 09/18/22 1751    Corena Herter,  MD 09/18/22 1951

## 2022-09-18 NOTE — ED Notes (Signed)
Provided pt with discharge instructions and education. All of pt questions answered. Pt in possession of all belongings. Pt AAOX4 and stable at time of discharge. Pt called family with hospital phone and family enroute for pickup. Pt transported to ED lobby in wheelchair as requested to await family pickup.

## 2022-09-18 NOTE — ED Notes (Signed)
No adverse reaction to blood transfusion at this time. PRBC unit complete. Pt able to eat entire meal tray provided. Pt remains on CCM, VS stable. No needs identified. Call light within reach.

## 2022-09-19 LAB — BPAM RBC
Blood Product Expiration Date: 202409062359
ISSUE DATE / TIME: 202408151633
Unit Type and Rh: 5100

## 2022-09-19 LAB — TYPE AND SCREEN
ABO/RH(D): O NEG
Antibody Screen: NEGATIVE
Unit division: 0

## 2022-09-24 DIAGNOSIS — Z5982 Transportation insecurity: Secondary | ICD-10-CM | POA: Insufficient documentation

## 2022-09-24 DIAGNOSIS — E213 Hyperparathyroidism, unspecified: Secondary | ICD-10-CM | POA: Insufficient documentation

## 2022-09-24 DIAGNOSIS — D219 Benign neoplasm of connective and other soft tissue, unspecified: Secondary | ICD-10-CM | POA: Insufficient documentation

## 2022-10-20 ENCOUNTER — Inpatient Hospital Stay: Payer: Managed Care, Other (non HMO)

## 2022-10-20 ENCOUNTER — Inpatient Hospital Stay: Payer: Managed Care, Other (non HMO) | Attending: Internal Medicine | Admitting: Internal Medicine

## 2022-10-20 ENCOUNTER — Encounter: Payer: Self-pay | Admitting: Internal Medicine

## 2022-10-20 ENCOUNTER — Telehealth: Payer: Self-pay

## 2022-10-20 ENCOUNTER — Other Ambulatory Visit: Payer: Managed Care, Other (non HMO)

## 2022-10-20 ENCOUNTER — Other Ambulatory Visit: Payer: Self-pay

## 2022-10-20 VITALS — BP 100/74 | HR 100 | Temp 97.5°F | Wt 172.4 lb

## 2022-10-20 DIAGNOSIS — D649 Anemia, unspecified: Secondary | ICD-10-CM

## 2022-10-20 DIAGNOSIS — N183 Chronic kidney disease, stage 3 unspecified: Secondary | ICD-10-CM

## 2022-10-20 LAB — CBC WITH DIFFERENTIAL/PLATELET
Abs Immature Granulocytes: 0.02 10*3/uL (ref 0.00–0.07)
Basophils Absolute: 0 10*3/uL (ref 0.0–0.1)
Basophils Relative: 0 %
Eosinophils Absolute: 0.1 10*3/uL (ref 0.0–0.5)
Eosinophils Relative: 1 %
HCT: 16.1 % — ABNORMAL LOW (ref 36.0–46.0)
Hemoglobin: 5.3 g/dL — CL (ref 12.0–15.0)
Immature Granulocytes: 0 %
Lymphocytes Relative: 26 %
Lymphs Abs: 1.3 10*3/uL (ref 0.7–4.0)
MCH: 27.7 pg (ref 26.0–34.0)
MCHC: 32.9 g/dL (ref 30.0–36.0)
MCV: 84.3 fL (ref 80.0–100.0)
Monocytes Absolute: 0.3 10*3/uL (ref 0.1–1.0)
Monocytes Relative: 7 %
Neutro Abs: 3.2 10*3/uL (ref 1.7–7.7)
Neutrophils Relative %: 66 %
Platelets: 148 10*3/uL — ABNORMAL LOW (ref 150–400)
RBC: 1.91 MIL/uL — ABNORMAL LOW (ref 3.87–5.11)
RDW: 18.1 % — ABNORMAL HIGH (ref 11.5–15.5)
WBC: 4.9 10*3/uL (ref 4.0–10.5)
nRBC: 0 % (ref 0.0–0.2)

## 2022-10-20 LAB — IRON AND TIBC
Iron: 159 ug/dL (ref 28–170)
Saturation Ratios: 90 % — ABNORMAL HIGH (ref 10.4–31.8)
TIBC: 176 ug/dL — ABNORMAL LOW (ref 250–450)
UIBC: 17 ug/dL

## 2022-10-20 LAB — COMPREHENSIVE METABOLIC PANEL
ALT: 18 U/L (ref 0–44)
AST: 35 U/L (ref 15–41)
Albumin: 3.7 g/dL (ref 3.5–5.0)
Alkaline Phosphatase: 93 U/L (ref 38–126)
Anion gap: 14 (ref 5–15)
BUN: 55 mg/dL — ABNORMAL HIGH (ref 6–20)
CO2: 15 mmol/L — ABNORMAL LOW (ref 22–32)
Calcium: 7.7 mg/dL — ABNORMAL LOW (ref 8.9–10.3)
Chloride: 102 mmol/L (ref 98–111)
Creatinine, Ser: 6.36 mg/dL — ABNORMAL HIGH (ref 0.44–1.00)
GFR, Estimated: 7 mL/min — ABNORMAL LOW (ref >60)
Glucose, Bld: 82 mg/dL (ref 70–99)
Potassium: 3.4 mmol/L — ABNORMAL LOW (ref 3.5–5.1)
Sodium: 131 mmol/L — ABNORMAL LOW (ref 135–145)
Total Bilirubin: 0.5 mg/dL (ref 0.3–1.2)
Total Protein: 7.5 g/dL (ref 6.5–8.1)

## 2022-10-20 LAB — FERRITIN: Ferritin: 1916 ng/mL — ABNORMAL HIGH (ref 11–307)

## 2022-10-20 LAB — LACTATE DEHYDROGENASE: LDH: 197 U/L — ABNORMAL HIGH (ref 98–192)

## 2022-10-20 LAB — VITAMIN B12: Vitamin B-12: 299 pg/mL (ref 180–914)

## 2022-10-20 LAB — RETICULOCYTES
Immature Retic Fract: 10.1 % (ref 2.3–15.9)
RBC.: 1.9 MIL/uL — ABNORMAL LOW (ref 3.87–5.11)
Retic Count, Absolute: 15.8 10*3/uL — ABNORMAL LOW (ref 19.0–186.0)
Retic Ct Pct: 0.8 % (ref 0.4–3.1)

## 2022-10-20 LAB — TECHNOLOGIST SMEAR REVIEW: Plt Morphology: ADEQUATE

## 2022-10-20 LAB — FOLATE: Folate: 4.3 ng/mL — ABNORMAL LOW (ref >5.9)

## 2022-10-20 NOTE — Progress Notes (Signed)
[facility] CONSULT NOTE  Patient Care Team: Care, [facility] as PCP - General Earna Coder, MD as Consulting Physician (Oncology)  CHIEF COMPLAINTS/PURPOSE OF CONSULTATION: ANEMIA   HEMATOLOGY HISTORY  # ANEMIA[Hb; MCV-platelets- WBC; Iron sat; ferritin;  GFR- CT/US; EGD/colonoscopy-  # CKD- [Dr.]  HISTORY OF PRESENTING ILLNESS: Patient ambulating-independently. Alone.  Megan Lambert 52 y.o.  female pleasant patient with H xof CKD stage V- and prior hx of anemia was been referred to [place] for further evaluation and treatment of anemia.  Reviewed the note from [facility]-unfortunately has been noncompliant with her follow-ups at [facility].  Last evaluation was about 5 months ago.  Most recent lab work about 10 days ago was-remarkable for hemoglobin 6.  Iron pillls do give her bad constipation so she goes without sometimes due to the bad constipation.  However patient taking iron pills every other day.  Blood in stools: none; colo/EGD- none [awaiting referral with PCP] Blood in urine:none Oral iron:  Constipation/- one every other day Prior blood transfusion: yes.  Liver disease: none Chronic kidney disease:  Alcohol: rare Bariatric surgery: none  Vaginal bleeding: none Prior evaluation with hematology: [facility]-  Prior bone marrow biopsy: none Prior IV iron infusions: yes; with [facility]    Review of Systems  Constitutional:  Positive for malaise/fatigue and weight loss. Negative for chills, diaphoresis and fever.  HENT:  Negative for nosebleeds and sore throat.   Eyes:  Negative for double vision.  Respiratory:  Positive for shortness of breath. Negative for cough, hemoptysis, sputum production and wheezing.   Cardiovascular:  Negative for chest pain, palpitations, orthopnea and leg swelling.  Gastrointestinal:  Positive for constipation. Negative for abdominal pain, blood in stool, diarrhea, heartburn, melena, nausea and vomiting.  Genitourinary:  Negative for  dysuria, frequency and urgency.  Musculoskeletal:  Negative for back pain and joint pain.  Skin: Negative.  Negative for itching and rash.  Neurological:  Negative for dizziness, tingling, focal weakness, weakness and headaches.  Endo/Heme/Allergies:  Does not bruise/bleed easily.  Psychiatric/Behavioral:  Negative for depression. The patient is not nervous/anxious and does not have insomnia.      MEDICAL HISTORY:  Past Medical History:  Diagnosis Date   Asthma    Hypertension     SURGICAL HISTORY: Past Surgical History:  Procedure Laterality Date   TUBAL LIGATION      SOCIAL HISTORY: Social History   Socioeconomic History   Marital status: Single    Spouse name: Not on file   Number of children: Not on file   Years of education: Not on file   Highest education level: Not on file  Occupational History   Not on file  Tobacco Use   Smoking status: Never    Passive exposure: Never   Smokeless tobacco: Never  Vaping Use   Vaping status: Never Used  Substance and Sexual Activity   Alcohol use: Yes    Comment: occasionally   Drug use: No   Sexual activity: Not Currently  Other Topics Concern   Not on file  Social History Narrative   Not on file   Social Determinants of Health   Financial Resource Strain: Medium Risk (04/16/2022)   Received from [facility]   Overall Financial Resource Strain (CARDIA)    Difficulty of Paying Living Expenses: Somewhat hard  Food Insecurity: No Food Insecurity (10/20/2022)   Hunger Vital Sign    Worried About Running Out of Food in the Last Year: Never true  Ran Out of Food in the Last Year: Never true  Transportation Needs: No Transportation Needs (10/20/2022)   PRAPARE - Administrator, Civil Service (Medical): No    Lack of Transportation (Non-Medical): No  Physical Activity: Not on file  Stress: No Stress Concern Present (04/16/2022)   Received from [facility] -  Occupational Stress Questionnaire    Feeling of Stress : Only a little  Social Connections: Not on file  Intimate Partner Violence: Not At Risk (10/20/2022)   Humiliation, Afraid, Rape, and Kick questionnaire    Fear of Current or Ex-Partner: No    Emotionally Abused: No    Physically Abused: No    Sexually Abused: No    FAMILY HISTORY: Family History  Problem Relation Age of Onset   Anemia Mother     ALLERGIES:  is allergic to lisinopril.  MEDICATIONS:  Current Outpatient Medications  Medication Sig Dispense Refill   ferrous sulfate 325 (65 FE) MG EC tablet Take 325 mg by mouth every other day.     folic acid (FOLVITE) 1 MG tablet Take 1 mg by mouth daily.     furosemide (LASIX) 20 MG tablet Take 20 mg by mouth daily.     magnesium oxide (MAG-OX) 400 MG tablet Take 400 mg by mouth daily.     No current facility-administered medications for this visit.     PHYSICAL EXAMINATION:   Vitals:   10/20/22 1121  BP: 100/74  Pulse: 100  Temp: (!) 97.5 F (36.4 C)  SpO2: 100%   Filed Weights   10/20/22 1121  Weight: 172 lb 6.4 oz (78.2 kg)   Appears pale.  Mild swelling in the legs.  Bilaterally.  Physical Exam Vitals and nursing note reviewed.  HENT:     Head: Normocephalic and atraumatic.     Mouth/Throat:     Pharynx: Oropharynx is clear.  Eyes:     Extraocular Movements: Extraocular movements intact.     Pupils: Pupils are equal, round, and reactive to light.  Cardiovascular:     Rate and Rhythm: Normal rate and regular rhythm.  Pulmonary:     Comments: Decreased breath sounds bilaterally.  Abdominal:     Palpations: Abdomen is soft.  Musculoskeletal:        General: Normal range of motion.     Cervical back: Normal range of motion.  Skin:    General: Skin is warm.  Neurological:     General: No focal deficit present.     Mental Status: She is alert and oriented to person, place, and time.  Psychiatric:        Behavior: Behavior normal.         Judgment: Judgment normal.      LABORATORY DATA:  I have reviewed the data as listed Lab Results  Component Value Date   WBC 4.9 10/20/2022   HGB 5.3 (LL) 10/20/2022   HCT 16.1 (L) 10/20/2022   MCV 84.3 10/20/2022   PLT 148 (L) 10/20/2022   Recent Labs    09/18/22 1223 10/20/22 1216  NA 132* 131*  K 3.3* 3.4*  CL 103 102  CO2 14* 15*  GLUCOSE 102* 82  BUN 35* 55*  CREATININE 4.93* 6.36*  CALCIUM 6.5* 7.7*  GFRNONAA 10* 7*  PROT  --  7.5  ALBUMIN  --  3.7  AST  --  35  ALT  --  18  ALKPHOS  --  93  BILITOT  --  0.5     No results found.  ASSESSMENT & PLAN:   Symptomatic anemia  Chronic anemia-normocytic recently getting worse.  Patient is quite symptomatic from anemia . The etiology is likely chronic renal disease. Labs- 2024-sep:  Hb 6.0; I sat- 78%; ferritin- > 1700. I had a long discussion with patient regarding multiple other etiologies of anemia-including nutritional; malabsorption; primary bone marrow disorders etc. however given patient's stage III kidney disease patient's anemia is likely secondary to renal failure.   #I recommend CBC CMP LDH peripheral smear; haptoglobin; erythropoietin; iron studies ferritin B12 folic acid; reticulocyte count; multiple myeloma panel.  Kappa lambda light chain ratio. HOLD of bone marrow Biopsy at this time; pending above work up.   # Discussed at length the pathophysiology of anemia from chronic kidney disease which includes decreased erythropoietin production; and decreased iron stores in the body.  I discussed that I would recommend using iron infusion/Venofer; along with Retacrit to maintain hemoglobin around 10.  I would recommend the goal hemoglobin less than 10 as there is a increased risk of thromboembolic events in the target hemoglobin is around 12 to 13.     I discussed the potential acute infusion reactions with IV iron; which are quite rare.  Patient understands the risk; will proceed with infusions.  Also discussed  the role of Retacrit boosting the hemoglobin.    # CKD stage V [s/p Kidney Bx [facility]]-reluctant with Dialysis- awaiting eval with nephro locally-   Thank you Dr. Rosine Beat for allowing me to participate in the care of your pleasant patient. Please do not hesitate to contact me with questions or concerns in the interim.  # DISPOSITION: # labs today- ordered.  # 1 unit PRBC in next 1-2 days; Retacrit  # 1 week- H&H; hold tube; retacrit; D-2 1 unit possible PRBC # follow up 2  weeks-  MD ; labs- cbc/bmp; hold tube; retacrit;  D-2 1 unit possible PRBC- Dr.B  Addendum: Patient's hemoglobin was 5.3 today.  Patient clinically without any obvious hemodynamic compromise.  If agreeable with the transfusion staff-proceed with PRBC transfusion tomorrow; and again later in the week- lab- H&H on 10/12/17; 1 unit PRBC on 9/20.  Discussed with Oneeka.     All questions were answered. The patient knows to call the clinic with any problems, questions or concerns.    Earna Coder, MD 10/20/2022 1:08 PM

## 2022-10-20 NOTE — Assessment & Plan Note (Addendum)
Chronic anemia-normocytic recently getting worse.  Patient is quite symptomatic from anemia . The etiology is likely chronic renal disease. Labs- 2024-sep:  Hb 6.0; I sat- 78%; ferritin- > 1700. I had a long discussion with patient regarding multiple other etiologies of anemia-including nutritional; malabsorption; primary bone marrow disorders etc. however given patient's stage III kidney disease patient's anemia is likely secondary to renal failure.   #I recommend CBC CMP LDH peripheral smear; haptoglobin; erythropoietin; iron studies ferritin B12 folic acid; reticulocyte count; multiple myeloma panel.  Kappa lambda light chain ratio. HOLD of bone marrow Biopsy at this time; pending above work up.   # Discussed at length the pathophysiology of anemia from chronic kidney disease which includes decreased erythropoietin production; and decreased iron stores in the body.  I discussed that I would recommend using iron infusion/Venofer; along with Retacrit to maintain hemoglobin around 10.  I would recommend the goal hemoglobin less than 10 as there is a increased risk of thromboembolic events in the target hemoglobin is around 12 to 13.     I discussed the potential acute infusion reactions with IV iron; which are quite rare.  Patient understands the risk; will proceed with infusions.  Also discussed the role of Retacrit boosting the hemoglobin.    # CKD stage V [s/p Kidney Bx [facility]]-reluctant with Dialysis- awaiting eval with nephro locally-   Thank you Dr. Rosine Beat for allowing me to participate in the care of your pleasant patient. Please do not hesitate to contact me with questions or concerns in the interim.  # DISPOSITION: # labs today- ordered.  # 1 unit PRBC in next 1-2 days; Retacrit  # 1 week- H&H; hold tube; retacrit; D-2 1 unit possible PRBC # follow up 2  weeks-  MD ; labs- cbc/bmp; hold tube; retacrit;  D-2 1 unit possible PRBC- Dr.B  Addendum: Patient's hemoglobin was 5.3 today.  Patient  clinically without any obvious hemodynamic compromise.  If agreeable with the transfusion staff-proceed with PRBC transfusion tomorrow; and again later in the week- lab- H&H on 10/12/17; 1 unit PRBC on 9/20.  Discussed with Oneeka.

## 2022-10-20 NOTE — Telephone Encounter (Signed)
Called informed patient hgb 5.3 per Dr. B blood ordered for tomorrow 9/17 at 9. She is also aware of repeat labs(Thurs 9/19) and possible 1 unit of Friday Vic Ripper 9/19). Patient verbalizes understanding & aware of appointments, expresses no questions and asymptomatic at this time.

## 2022-10-20 NOTE — Progress Notes (Signed)
Patient said that when she started taking her magnesium medication her finger tips have been peeling. Iron pillls do give her bad constipation so she goes without sometimes due to the bad constipation.

## 2022-10-21 ENCOUNTER — Other Ambulatory Visit: Payer: Self-pay | Admitting: *Deleted

## 2022-10-21 ENCOUNTER — Inpatient Hospital Stay: Payer: Managed Care, Other (non HMO)

## 2022-10-21 DIAGNOSIS — D649 Anemia, unspecified: Secondary | ICD-10-CM | POA: Diagnosis not present

## 2022-10-21 LAB — KAPPA/LAMBDA LIGHT CHAINS
Kappa free light chain: 102.1 mg/L — ABNORMAL HIGH (ref 3.3–19.4)
Kappa, lambda light chain ratio: 0.78 (ref 0.26–1.65)
Lambda free light chains: 130.8 mg/L — ABNORMAL HIGH (ref 5.7–26.3)

## 2022-10-21 LAB — PREPARE RBC (CROSSMATCH)

## 2022-10-21 MED ORDER — SODIUM CHLORIDE 0.9% IV SOLUTION
250.0000 mL | Freq: Once | INTRAVENOUS | Status: AC
  Administered 2022-10-21: 250 mL via INTRAVENOUS
  Filled 2022-10-21: qty 250

## 2022-10-21 MED ORDER — DIPHENHYDRAMINE HCL 25 MG PO CAPS
25.0000 mg | ORAL_CAPSULE | Freq: Once | ORAL | Status: AC
  Administered 2022-10-21: 25 mg via ORAL
  Filled 2022-10-21: qty 1

## 2022-10-21 MED ORDER — ACETAMINOPHEN 325 MG PO TABS
650.0000 mg | ORAL_TABLET | Freq: Once | ORAL | Status: AC
  Administered 2022-10-21: 650 mg via ORAL
  Filled 2022-10-21: qty 2

## 2022-10-22 LAB — BPAM RBC
Blood Product Expiration Date: 202409202359
ISSUE DATE / TIME: 202409171020
Unit Type and Rh: 9500

## 2022-10-22 LAB — TYPE AND SCREEN
ABO/RH(D): O NEG
Antibody Screen: NEGATIVE
Unit division: 0

## 2022-10-23 ENCOUNTER — Inpatient Hospital Stay: Payer: Managed Care, Other (non HMO)

## 2022-10-23 ENCOUNTER — Other Ambulatory Visit: Payer: Self-pay | Admitting: *Deleted

## 2022-10-23 DIAGNOSIS — D649 Anemia, unspecified: Secondary | ICD-10-CM

## 2022-10-23 LAB — HEMOGLOBIN AND HEMATOCRIT (CANCER CENTER ONLY)
HCT: 20.5 % — ABNORMAL LOW (ref 36.0–46.0)
Hemoglobin: 6.7 g/dL — CL (ref 12.0–15.0)

## 2022-10-23 LAB — SAMPLE TO BLOOD BANK

## 2022-10-23 LAB — PREPARE RBC (CROSSMATCH)

## 2022-10-24 ENCOUNTER — Inpatient Hospital Stay: Payer: Managed Care, Other (non HMO)

## 2022-10-24 DIAGNOSIS — D649 Anemia, unspecified: Secondary | ICD-10-CM | POA: Diagnosis not present

## 2022-10-24 LAB — MULTIPLE MYELOMA PANEL, SERUM
Albumin SerPl Elph-Mcnc: 3.7 g/dL (ref 2.9–4.4)
Albumin/Glob SerPl: 1.3 (ref 0.7–1.7)
Alpha 1: 0.3 g/dL (ref 0.0–0.4)
Alpha2 Glob SerPl Elph-Mcnc: 0.7 g/dL (ref 0.4–1.0)
B-Globulin SerPl Elph-Mcnc: 1 g/dL (ref 0.7–1.3)
Gamma Glob SerPl Elph-Mcnc: 1 g/dL (ref 0.4–1.8)
Globulin, Total: 3 g/dL (ref 2.2–3.9)
IgA: 446 mg/dL — ABNORMAL HIGH (ref 87–352)
IgG (Immunoglobin G), Serum: 1033 mg/dL (ref 586–1602)
IgM (Immunoglobulin M), Srm: 110 mg/dL (ref 26–217)
Total Protein ELP: 6.7 g/dL (ref 6.0–8.5)

## 2022-10-24 MED ORDER — SODIUM CHLORIDE 0.9% IV SOLUTION
250.0000 mL | Freq: Once | INTRAVENOUS | Status: AC
  Administered 2022-10-24: 250 mL via INTRAVENOUS
  Filled 2022-10-24: qty 250

## 2022-10-24 NOTE — Patient Instructions (Signed)
Blood Transfusion, Adult A blood transfusion is a procedure in which you receive blood or a type of blood cell (blood component) through an IV. You may need a blood transfusion when you have a low blood count, which is a low number of any blood cell. This may result from a bleeding disorder, illness, injury, or surgery. The blood may come from a donor, or you may be able to have your own blood collected and stored (autologous blood donation) before a planned surgery. The blood given in a transfusion may be made up of different blood components. You may receive: Red blood cells. These carry oxygen to the cells in the body. Platelets. These help your blood to clot. Plasma. This is the liquid part of your blood. It carries proteins and other substances throughout the body. White blood cells. These help you fight infections. If you have hemophilia or another clotting disorder, you may also receive other types of blood products. Depending on the type of blood product, this procedure may take 1-4 hours to complete. Tell a health care provider about: Any bleeding problems you have. Any previous reactions you have had during a blood transfusion. Any allergies you have. All medicines you are taking, including vitamins, herbs, eye drops, creams, and over-the-counter medicines. Any surgeries you have had. Any medical conditions you have. Whether you are pregnant or may be pregnant. What are the risks? Talk with your health care provider about risks. The most common problems include: A mild allergic reaction, such as red, swollen areas of skin (hives) and itching. Fever or chills. This may be the body's response to new blood cells received. This may occur during or up to 4 hours after the transfusion. More serious problems may include: A serious allergic reaction that causes difficulty breathing or swelling around the face and lips. Transfusion-associated circulatory overload (TACO), or too much fluid in  the lungs. This may cause breathing problems. Transfusion-related acute lung injury (TRALI), which causes breathing difficulty and low oxygen in the blood. This can occur within hours of the transfusion or several days later. Iron overload. This can happen after receiving many blood transfusions over a period of time. Infection or virus being transmitted. This is rare because donated blood is carefully tested before it is given. Hemolytic transfusion reaction. This is rare. It happens when the body's defense system (immune system)tries to attack the new blood cells. Symptoms may include fever, chills, nausea, low blood pressure, and low back or chest pain. Transfusion-associated graft-versus-host disease (TAGVHD). This is rare. It happens when donated cells attack the body's healthy tissues. What happens before the procedure? You will have a blood test to check your blood type. This test is done to know what kind of blood your body will accept and to match it to the donor blood. If you are going to have a planned surgery, you may be able to do an autologous blood donation. This may be done in case you need to have a transfusion. You will have your temperature, blood pressure, and pulse checked before the transfusion. If you have had an allergic reaction to a transfusion in the past, you may be given medicine to help prevent a reaction. This medicine may be given to you by mouth (orally) or through an IV. What happens during the procedure?  An IV will be inserted into one of your veins. The bag of blood will be attached to your IV. The blood will then enter through your vein. Your temperature, blood pressure,  and pulse will be monitored during the transfusion. This monitoring is done to detect early signs of a transfusion reaction. Tell your nurse right away if you have any of these symptoms during the transfusion: Shortness of breath or trouble breathing. Chest or back pain. Fever or  chills. Itching or hives. If you have any signs or symptoms of a reaction, your transfusion will be stopped and you may be given medicine. When the transfusion is complete, your IV will be removed. Pressure may be applied to the IV site for a few minutes. A bandage (dressing)will be applied. The procedure may vary among health care providers and hospitals. What happens after the procedure? Your temperature, blood pressure, pulse, breathing rate, and blood oxygen level will be monitored until you leave the hospital or clinic. Your blood may be tested to see how you have responded to the transfusion. You may be warmed with fluids or blankets to maintain a normal body temperature. If you receive your blood transfusion in an outpatient setting, you will be told whom to contact to report any reactions. Where to find more information Visit the [employer] Cross: redcross.org Summary A blood transfusion is a procedure in which you receive blood or a type of blood cell (blood component) through an IV. The blood given in a transfusion may be made up of different blood components. You may receive red blood cells, platelets, plasma, or white blood cells depending on the condition treated. Your temperature, blood pressure, and pulse will be monitored before, during, and after the transfusion. After the transfusion, your blood may be tested to see how your body has responded. This information is not intended to replace advice given to you by your health care provider. Make sure you discuss any questions you have with your health care provider. Document Revised: 04/19/2021 Document Reviewed: 04/19/2021 Elsevier Patient Education  2024 [employer].

## 2022-10-25 LAB — TYPE AND SCREEN
ABO/RH(D): O NEG
Antibody Screen: NEGATIVE
Unit division: 0

## 2022-10-25 LAB — BPAM RBC
Blood Product Expiration Date: 202410182359
ISSUE DATE / TIME: 202409201345
Unit Type and Rh: 5100

## 2022-10-27 ENCOUNTER — Inpatient Hospital Stay: Payer: Managed Care, Other (non HMO)

## 2022-10-27 DIAGNOSIS — D649 Anemia, unspecified: Secondary | ICD-10-CM | POA: Diagnosis not present

## 2022-10-27 LAB — CBC WITH DIFFERENTIAL (CANCER CENTER ONLY)
Abs Immature Granulocytes: 0.02 10*3/uL (ref 0.00–0.07)
Basophils Absolute: 0 10*3/uL (ref 0.0–0.1)
Basophils Relative: 1 %
Eosinophils Absolute: 0.1 10*3/uL (ref 0.0–0.5)
Eosinophils Relative: 2 %
HCT: 22.4 % — ABNORMAL LOW (ref 36.0–46.0)
Hemoglobin: 7.3 g/dL — ABNORMAL LOW (ref 12.0–15.0)
Immature Granulocytes: 1 %
Lymphocytes Relative: 29 %
Lymphs Abs: 1.2 10*3/uL (ref 0.7–4.0)
MCH: 28 pg (ref 26.0–34.0)
MCHC: 32.6 g/dL (ref 30.0–36.0)
MCV: 85.8 fL (ref 80.0–100.0)
Monocytes Absolute: 0.3 10*3/uL (ref 0.1–1.0)
Monocytes Relative: 6 %
Neutro Abs: 2.5 10*3/uL (ref 1.7–7.7)
Neutrophils Relative %: 61 %
Platelet Count: 165 10*3/uL (ref 150–400)
RBC: 2.61 MIL/uL — ABNORMAL LOW (ref 3.87–5.11)
RDW: 15.6 % — ABNORMAL HIGH (ref 11.5–15.5)
WBC Count: 4.2 10*3/uL (ref 4.0–10.5)
nRBC: 0 % (ref 0.0–0.2)

## 2022-10-27 LAB — BASIC METABOLIC PANEL
Anion gap: 16 — ABNORMAL HIGH (ref 5–15)
BUN: 51 mg/dL — ABNORMAL HIGH (ref 6–20)
CO2: 17 mmol/L — ABNORMAL LOW (ref 22–32)
Calcium: 7.3 mg/dL — ABNORMAL LOW (ref 8.9–10.3)
Chloride: 102 mmol/L (ref 98–111)
Creatinine, Ser: 6.43 mg/dL — ABNORMAL HIGH (ref 0.44–1.00)
GFR, Estimated: 7 mL/min — ABNORMAL LOW (ref >60)
Glucose, Bld: 92 mg/dL (ref 70–99)
Potassium: 3.5 mmol/L (ref 3.5–5.1)
Sodium: 135 mmol/L (ref 135–145)

## 2022-10-27 LAB — SAMPLE TO BLOOD BANK

## 2022-10-28 ENCOUNTER — Inpatient Hospital Stay: Payer: Managed Care, Other (non HMO)

## 2022-10-28 ENCOUNTER — Telehealth: Payer: Self-pay

## 2022-10-28 NOTE — Telephone Encounter (Signed)
Per Anders Grant FYI  [employer] has denied retacrit due to the patient's policy is enrolled in a [employer] specialty network which means they prefer for this drug to go through specialty pharmacy. However, I am waiting for a case nurse to be assigned to this case to see if a NAP form can be completed and have an exception be approved for it to be done at the facility.

## 2022-10-30 ENCOUNTER — Other Ambulatory Visit: Payer: Self-pay | Admitting: *Deleted

## 2022-10-30 DIAGNOSIS — D649 Anemia, unspecified: Secondary | ICD-10-CM

## 2022-11-03 ENCOUNTER — Inpatient Hospital Stay: Payer: Managed Care, Other (non HMO)

## 2022-11-03 ENCOUNTER — Inpatient Hospital Stay: Payer: Managed Care, Other (non HMO) | Admitting: Internal Medicine

## 2022-11-04 ENCOUNTER — Inpatient Hospital Stay: Payer: Managed Care, Other (non HMO)

## 2022-11-04 ENCOUNTER — Telehealth: Payer: Self-pay

## 2022-11-04 NOTE — Telephone Encounter (Signed)
Rikia from [employer] called stating that patient was denied retacrit and was wondering if MD would like do a peer to peer. Call back # [phone] ext- [phone]

## 2022-11-12 ENCOUNTER — Other Ambulatory Visit: Payer: Self-pay | Admitting: Internal Medicine

## 2022-11-12 ENCOUNTER — Telehealth: Payer: Self-pay | Admitting: Internal Medicine

## 2022-11-12 NOTE — Telephone Encounter (Signed)
Approved for aranesp 200 mcg q 4W [thru [facility]; currently approved until April 2025]. Spoke to MD - will approve aranesp;, however awaiting to confirm  network status. Will call back if any issues.

## 2022-11-14 ENCOUNTER — Telehealth: Payer: Self-pay | Admitting: *Deleted

## 2022-11-14 NOTE — Telephone Encounter (Signed)
Rakia from [facility] called back to say Aranesp was approved by Peer 2 peer but our facility is not approved in network. Lynda Rainwater is approving 1 x dosage at our clinic for next week. We will then need to send Drug prescription, dosaging and nursing orders for injection to be given in the home to [facility] fax # [phone]. A phone number [phone]

## 2022-11-18 ENCOUNTER — Inpatient Hospital Stay: Payer: Managed Care, Other (non HMO)

## 2022-11-18 ENCOUNTER — Telehealth: Payer: Self-pay | Admitting: *Deleted

## 2022-11-18 ENCOUNTER — Other Ambulatory Visit: Payer: Self-pay | Admitting: *Deleted

## 2022-11-18 ENCOUNTER — Inpatient Hospital Stay: Payer: Managed Care, Other (non HMO) | Attending: Internal Medicine

## 2022-11-18 VITALS — BP 152/94 | HR 110 | Temp 97.4°F

## 2022-11-18 DIAGNOSIS — D649 Anemia, unspecified: Secondary | ICD-10-CM

## 2022-11-18 DIAGNOSIS — D631 Anemia in chronic kidney disease: Secondary | ICD-10-CM | POA: Diagnosis present

## 2022-11-18 DIAGNOSIS — N185 Chronic kidney disease, stage 5: Secondary | ICD-10-CM | POA: Diagnosis present

## 2022-11-18 LAB — CBC WITH DIFFERENTIAL (CANCER CENTER ONLY)
Abs Immature Granulocytes: 0.02 10*3/uL (ref 0.00–0.07)
Basophils Absolute: 0 10*3/uL (ref 0.0–0.1)
Basophils Relative: 1 %
Eosinophils Absolute: 0 10*3/uL (ref 0.0–0.5)
Eosinophils Relative: 1 %
HCT: 21.2 % — ABNORMAL LOW (ref 36.0–46.0)
Hemoglobin: 7.2 g/dL — ABNORMAL LOW (ref 12.0–15.0)
Immature Granulocytes: 0 %
Lymphocytes Relative: 21 %
Lymphs Abs: 1.1 10*3/uL (ref 0.7–4.0)
MCH: 27.8 pg (ref 26.0–34.0)
MCHC: 34 g/dL (ref 30.0–36.0)
MCV: 81.9 fL (ref 80.0–100.0)
Monocytes Absolute: 0.4 10*3/uL (ref 0.1–1.0)
Monocytes Relative: 7 %
Neutro Abs: 3.5 10*3/uL (ref 1.7–7.7)
Neutrophils Relative %: 70 %
Platelet Count: 137 10*3/uL — ABNORMAL LOW (ref 150–400)
RBC: 2.59 MIL/uL — ABNORMAL LOW (ref 3.87–5.11)
RDW: 15.9 % — ABNORMAL HIGH (ref 11.5–15.5)
WBC Count: 5 10*3/uL (ref 4.0–10.5)
nRBC: 0 % (ref 0.0–0.2)

## 2022-11-18 LAB — BASIC METABOLIC PANEL - CANCER CENTER ONLY
Anion gap: 17 — ABNORMAL HIGH (ref 5–15)
BUN: 46 mg/dL — ABNORMAL HIGH (ref 6–20)
CO2: 15 mmol/L — ABNORMAL LOW (ref 22–32)
Calcium: 7.3 mg/dL — ABNORMAL LOW (ref 8.9–10.3)
Chloride: 102 mmol/L (ref 98–111)
Creatinine: 6.21 mg/dL — ABNORMAL HIGH (ref 0.44–1.00)
GFR, Estimated: 7 mL/min — ABNORMAL LOW (ref >60)
Glucose, Bld: 84 mg/dL (ref 70–99)
Potassium: 3.4 mmol/L — ABNORMAL LOW (ref 3.5–5.1)
Sodium: 134 mmol/L — ABNORMAL LOW (ref 135–145)

## 2022-11-18 LAB — PREPARE RBC (CROSSMATCH)

## 2022-11-18 LAB — SAMPLE TO BLOOD BANK

## 2022-11-18 MED ORDER — DARBEPOETIN ALFA 200 MCG/0.4ML IJ SOSY
200.0000 ug | PREFILLED_SYRINGE | Freq: Once | INTRAMUSCULAR | Status: AC
  Administered 2022-11-18: 200 ug via SUBCUTANEOUS
  Filled 2022-11-18: qty 0.4

## 2022-11-18 NOTE — Telephone Encounter (Signed)
Called pt to let her know she needs 1 unit of blood. We have a chair for her tomm morning at 9 am. She is going to try and get a co worker to go in for her tomm morning. I also discussed her aranesp injection was covered only for 1 x which was today at [facility] facility. Discussed her going back to [facility] hematology for future aranesp injections. She also mentioned she was getting them at her kidney doctor. She is going to try and get them to restart injections at the kidney center.

## 2022-11-19 ENCOUNTER — Other Ambulatory Visit: Payer: Self-pay | Admitting: *Deleted

## 2022-11-19 ENCOUNTER — Other Ambulatory Visit: Payer: Self-pay | Admitting: Internal Medicine

## 2022-11-19 ENCOUNTER — Other Ambulatory Visit: Payer: Self-pay

## 2022-11-19 ENCOUNTER — Inpatient Hospital Stay: Payer: Managed Care, Other (non HMO)

## 2022-11-19 DIAGNOSIS — D649 Anemia, unspecified: Secondary | ICD-10-CM

## 2022-11-19 DIAGNOSIS — N185 Chronic kidney disease, stage 5: Secondary | ICD-10-CM | POA: Diagnosis not present

## 2022-11-19 MED ORDER — ARANESP (ALBUMIN FREE) 200 MCG/ML IJ SOLN: 200.0000 ug | INTRAMUSCULAR | 12 refills | Status: DC

## 2022-11-19 MED ORDER — SODIUM CHLORIDE 0.9% IV SOLUTION
250.0000 mL | Freq: Once | INTRAVENOUS | Status: AC
  Administered 2022-11-19: 100 mL via INTRAVENOUS
  Filled 2022-11-19: qty 250

## 2022-11-19 MED ORDER — ACETAMINOPHEN 325 MG PO TABS
650.0000 mg | ORAL_TABLET | Freq: Once | ORAL | Status: AC
  Administered 2022-11-19: 650 mg via ORAL
  Filled 2022-11-19: qty 2

## 2022-11-19 MED ORDER — DIPHENHYDRAMINE HCL 25 MG PO CAPS
25.0000 mg | ORAL_CAPSULE | Freq: Once | ORAL | Status: AC
  Administered 2022-11-19: 25 mg via ORAL
  Filled 2022-11-19: qty 1

## 2022-11-19 MED ORDER — ARANESP (ALBUMIN FREE) 200 MCG/ML IJ SOLN
200.0000 ug | INTRAMUSCULAR | 12 refills | Status: DC
Start: 2022-11-19 — End: 2022-11-19

## 2022-11-20 ENCOUNTER — Other Ambulatory Visit: Payer: Self-pay | Admitting: *Deleted

## 2022-11-20 LAB — BPAM RBC
Blood Product Expiration Date: 202410202359
ISSUE DATE / TIME: 202410160948
Unit Type and Rh: 202410202359
Unit Type and Rh: 5100

## 2022-11-20 LAB — TYPE AND SCREEN
ABO/RH(D): O NEG
Antibody Screen: NEGATIVE
Unit division: 0

## 2022-11-20 MED ORDER — ARANESP (ALBUMIN FREE) 200 MCG/ML IJ SOLN: 200.0000 ug | INTRAMUSCULAR | 12 refills | Status: DC

## 2022-11-21 ENCOUNTER — Telehealth: Payer: Self-pay | Admitting: *Deleted

## 2022-11-21 MED ORDER — DARBEPOETIN ALFA 200 MCG/0.4ML IJ SOSY: 200.0000 ug | PREFILLED_SYRINGE | INTRAMUSCULAR | 0 refills | Status: AC

## 2022-11-21 NOTE — Telephone Encounter (Signed)
Faxing Aranesp prescription to [facility] with Nursing order to administer injections at patients home.Medicals sent as well. Faxed to [phone]

## 2022-12-19 ENCOUNTER — Other Ambulatory Visit (INDEPENDENT_AMBULATORY_CARE_PROVIDER_SITE_OTHER): Payer: Self-pay | Admitting: Nurse Practitioner

## 2022-12-19 DIAGNOSIS — N185 Chronic kidney disease, stage 5: Secondary | ICD-10-CM

## 2022-12-23 ENCOUNTER — Telehealth: Payer: Self-pay | Admitting: *Deleted

## 2022-12-23 NOTE — Telephone Encounter (Signed)
Message from scheduling that patient called and said her pcp wanted her to call us because her labs are low and she needs an iron infusion    Note from her PCP Mangel, Benison Pap, DO - 12/23/2022 9:56 AM EST Formatting of this note might be different from the original.  Discussed case with on-call provider earlier this morning. On-call provider did contact patient overnight regarding her labs.  I contacted patient this morning as well to discuss her labs. -We discussed that her magnesium is still critically low at 0.9 -Additionally discussed that her Hgb was found to be 5.8.  I advised that she go to the emergency room for repeat blood work but she would likely need a transfusion and IV supplementation for her low Mag. She has refused to go to the ER in [place] but will go to [place]. Discussed importance of going to get these labs obtained and proper treatment. She verbalized understanding to this as well and agreement to plan. Electronically signed by Karie Georges Pap, DO at 12/23/2022 10:00 AM EST   I asked her why she did not go to ER she said she does not have transportation I stressed importance of trying to find a way with her lab values. She got upset with me and said if she can she will if not she will figure something out.   Per Dr B she needs to go to ER.

## 2022-12-24 NOTE — Telephone Encounter (Signed)
I checked chart to see if patient went to ER and do not find that she went. I called her PCP office to inform them of my conversation with patient yesterday and that we do not have chair availability this  week or next to accommodate her for infusions. I had to leave a message on nurses voice mail as she was with doctor in a room

## 2022-12-30 ENCOUNTER — Encounter (INDEPENDENT_AMBULATORY_CARE_PROVIDER_SITE_OTHER): Payer: Self-pay

## 2022-12-30 ENCOUNTER — Encounter (INDEPENDENT_AMBULATORY_CARE_PROVIDER_SITE_OTHER): Payer: Self-pay | Admitting: Nurse Practitioner

## 2022-12-30 ENCOUNTER — Other Ambulatory Visit (INDEPENDENT_AMBULATORY_CARE_PROVIDER_SITE_OTHER): Payer: Self-pay

## 2023-01-07 ENCOUNTER — Ambulatory Visit (INDEPENDENT_AMBULATORY_CARE_PROVIDER_SITE_OTHER): Payer: Managed Care, Other (non HMO)

## 2023-01-07 ENCOUNTER — Encounter (INDEPENDENT_AMBULATORY_CARE_PROVIDER_SITE_OTHER): Payer: Self-pay | Admitting: Nurse Practitioner

## 2023-01-07 DIAGNOSIS — N185 Chronic kidney disease, stage 5: Secondary | ICD-10-CM

## 2023-01-20 ENCOUNTER — Encounter (INDEPENDENT_AMBULATORY_CARE_PROVIDER_SITE_OTHER): Payer: Self-pay | Admitting: Nurse Practitioner

## 2023-05-09 ENCOUNTER — Emergency Department
Admission: EM | Admit: 2023-05-09 | Discharge: 2023-05-09 | Disposition: A | Discharge status: Home / Self Care | Attending: Emergency Medicine | Admitting: Emergency Medicine

## 2023-05-09 ENCOUNTER — Emergency Department

## 2023-05-09 ENCOUNTER — Other Ambulatory Visit: Payer: Self-pay

## 2023-05-09 DIAGNOSIS — K219 Gastro-esophageal reflux disease without esophagitis: Secondary | ICD-10-CM | POA: Diagnosis not present

## 2023-05-09 DIAGNOSIS — R7401 Elevation of levels of liver transaminase levels: Secondary | ICD-10-CM | POA: Diagnosis not present

## 2023-05-09 DIAGNOSIS — N189 Chronic kidney disease, unspecified: Secondary | ICD-10-CM | POA: Diagnosis not present

## 2023-05-09 DIAGNOSIS — D649 Anemia, unspecified: Secondary | ICD-10-CM

## 2023-05-09 DIAGNOSIS — R7989 Other specified abnormal findings of blood chemistry: Secondary | ICD-10-CM | POA: Diagnosis not present

## 2023-05-09 DIAGNOSIS — R0789 Other chest pain: Secondary | ICD-10-CM

## 2023-05-09 DIAGNOSIS — D631 Anemia in chronic kidney disease: Secondary | ICD-10-CM | POA: Insufficient documentation

## 2023-05-09 DIAGNOSIS — R079 Chest pain, unspecified: Secondary | ICD-10-CM | POA: Diagnosis present

## 2023-05-09 HISTORY — DX: Anemia, unspecified: D64.9

## 2023-05-09 LAB — CBC
HCT: 16.5 % — ABNORMAL LOW (ref 36.0–46.0)
Hemoglobin: 5.4 g/dL — ABNORMAL LOW (ref 12.0–15.0)
MCH: 28.1 pg (ref 26.0–34.0)
MCHC: 32.7 g/dL (ref 30.0–36.0)
MCV: 85.9 fL (ref 80.0–100.0)
Platelets: 167 10*3/uL (ref 150–400)
RBC: 1.92 MIL/uL — ABNORMAL LOW (ref 3.87–5.11)
RDW: 18.8 % — ABNORMAL HIGH (ref 11.5–15.5)
WBC: 5.4 10*3/uL (ref 4.0–10.5)
nRBC: 0 % (ref 0.0–0.2)

## 2023-05-09 LAB — BASIC METABOLIC PANEL WITH GFR
Anion gap: 17 — ABNORMAL HIGH (ref 5–15)
BUN: 54 mg/dL — ABNORMAL HIGH (ref 6–20)
CO2: 25 mmol/L (ref 22–32)
Calcium: 9.1 mg/dL (ref 8.9–10.3)
Chloride: 94 mmol/L — ABNORMAL LOW (ref 98–111)
Creatinine, Ser: 7.78 mg/dL — ABNORMAL HIGH (ref 0.44–1.00)
GFR, Estimated: 6 mL/min — ABNORMAL LOW (ref >60)
Glucose, Bld: 109 mg/dL — ABNORMAL HIGH (ref 70–99)
Potassium: 2.9 mmol/L — ABNORMAL LOW (ref 3.5–5.1)
Sodium: 136 mmol/L (ref 135–145)

## 2023-05-09 LAB — HEPATIC FUNCTION PANEL
ALT: 29 U/L (ref 0–44)
AST: 63 U/L — ABNORMAL HIGH (ref 15–41)
Albumin: 4 g/dL (ref 3.5–5.0)
Alkaline Phosphatase: 77 U/L (ref 38–126)
Bilirubin, Direct: 0.1 mg/dL (ref 0.0–0.2)
Indirect Bilirubin: 0.8 mg/dL (ref 0.3–0.9)
Total Bilirubin: 0.9 mg/dL (ref 0.0–1.2)
Total Protein: 7.6 g/dL (ref 6.5–8.1)

## 2023-05-09 LAB — LIPASE, BLOOD: Lipase: 62 U/L — ABNORMAL HIGH (ref 11–51)

## 2023-05-09 LAB — PREPARE RBC (CROSSMATCH)

## 2023-05-09 LAB — TROPONIN I (HIGH SENSITIVITY)
Troponin I (High Sensitivity): 23 ng/L — ABNORMAL HIGH (ref <18)
Troponin I (High Sensitivity): 21 ng/L — ABNORMAL HIGH (ref <18)

## 2023-05-09 MED ORDER — SODIUM CHLORIDE 0.9% IV SOLUTION
Freq: Once | INTRAVENOUS | Status: AC
  Filled 2023-05-09: qty 250

## 2023-05-09 MED ORDER — ALUM & MAG HYDROXIDE-SIMETH 200-200-20 MG/5ML PO SUSP
30.0000 mL | Freq: Once | ORAL | Status: AC
  Administered 2023-05-09: 30 mL via ORAL
  Filled 2023-05-09: qty 30

## 2023-05-09 MED ORDER — PANTOPRAZOLE SODIUM 40 MG IV SOLR
40.0000 mg | Freq: Once | INTRAVENOUS | Status: AC
  Administered 2023-05-09: 40 mg via INTRAVENOUS
  Filled 2023-05-09: qty 10

## 2023-05-09 MED ORDER — ACETAMINOPHEN 500 MG PO TABS
1000.0000 mg | ORAL_TABLET | Freq: Once | ORAL | Status: AC
  Administered 2023-05-09: 1000 mg via ORAL
  Filled 2023-05-09: qty 2

## 2023-05-09 NOTE — Discharge Instructions (Addendum)
 We discussed admission to the hospital but you have declined.  We have transfused you 2 units of blood.  She should return to the ER if you develop worsening symptoms or any other concerns and follow-up for recheck of your hemoglobin next week and return to the ER if develop worsening symptoms.  Your kidney function is slightly worse than \ previously continue to follow-up with your renal doctor.

## 2023-05-09 NOTE — ED Provider Notes (Signed)
 [facility] Provider Note    Event Date/Time   First MD Initiated Contact with Patient 05/09/23 0730     (approximate)   History   Abdominal Pain   HPI  Megan Lambert is a 53 y.o. female with CKD, alcohol use who comes in with concerns for abdominal pain.  Patient reports is not really abdominal pain is actually more upper chest pain.  She states that she feels like it is related to reflux.  She is told her doctor that she has reflux but have never started her on any medications.  She reports it feels like a burning sensation.  She states that when she belches that she actually starts to feel better.  She also reports having a bowel movement today that was normal in nature but helped her symptoms feel better as well.  She denies any abdominal pain contrary to triage notes.  She does report taking iron for her known anemia so she sometimes does have some dark stools but no different than her normal.  I reviewed patient's prior office visits where her heart rate back in November was 103, 110 on 9/30.  I also reviewed a discharge summary from 2/22 where patient had tachycardia and was evaluated for it.   She denies any known shortness of breath, new leg swelling, history of blood clots.  She does report a history of alcohol use.  States that she did drink last a few days ago.  Denies any history of withdrawal.  Physical Exam   Triage Vital Signs: ED Triage Vitals  Encounter Vitals Group     BP 05/09/23 0724 (!) 160/92     Systolic BP Percentile --      Diastolic BP Percentile --      Pulse Rate 05/09/23 0724 (!) 115     Resp 05/09/23 0724 16     Temp 05/09/23 0724 97.6 F (36.4 C)     Temp Source 05/09/23 0724 Oral     SpO2 05/09/23 0724 100 %     Weight 05/09/23 0722 172 lb (78 kg)     Height 05/09/23 0722 6' (1.829 m)     Head Circumference --      Peak Flow --      Pain Score 05/09/23 0722 8     Pain Loc --      Pain Education --       Exclude from Growth Chart --     Most recent vital signs: Vitals:   05/09/23 0724  BP: (!) 160/92  Pulse: (!) 115  Resp: 16  Temp: 97.6 F (36.4 C)  SpO2: 100%     General: Awake, no distress.  CV:  Good peripheral perfusion.  Resp:  Normal effort.  Abd:  No distention.  Soft nontender Other:  No swelling, no calf tenderness Brown stool hemeoccult negative  ED Results / Procedures / Treatments   Labs (all labs ordered are listed, but only abnormal results are displayed) Labs Reviewed  BASIC METABOLIC PANEL WITH GFR  CBC  LIPASE, BLOOD  HEPATIC FUNCTION PANEL  TYPE AND SCREEN  TROPONIN I (HIGH SENSITIVITY)     EKG  My interpretation of EKG:  Sinus tachycardia rate of 106 without any ST elevation or T wave inversions, normal intervals  RADIOLOGY I have reviewed the xray personally and interpreted and no evidence of any pneumonia   PROCEDURES:  Critical Care performed: Yes, see critical care procedure note(s)  .1-3 Lead EKG Interpretation  Performed by: Concha Se, MD Authorized by: Concha Se, MD     Interpretation: normal     ECG rate:  80   ECG rate assessment: normal     Rhythm: sinus rhythm     Ectopy: none     Conduction: normal   .Critical Care  Performed by: Concha Se, MD Authorized by: Concha Se, MD   Critical care provider statement:    Critical care time (minutes):  30   Critical care was necessary to treat or prevent imminent or life-threatening deterioration of the following conditions: symptomatic anemia.   Critical care was time spent personally by me on the following activities:  Development of treatment plan with patient or surrogate, discussions with consultants, evaluation of patient's response to treatment, examination of patient, ordering and review of laboratory studies, ordering and review of radiographic studies, ordering and performing treatments and interventions, pulse oximetry, re-evaluation of patient's  condition and review of old charts    MEDICATIONS ORDERED IN ED: Medications  pantoprazole (PROTONIX) injection 40 mg (40 mg Intravenous Given 05/09/23 0801)  acetaminophen (TYLENOL) tablet 1,000 mg (1,000 mg Oral Given 05/09/23 0759)  alum & mag hydroxide-simeth (MAALOX/MYLANTA) 200-200-20 MG/5ML suspension 30 mL (30 mLs Oral Given 05/09/23 0801)  0.9 %  sodium chloride infusion (Manually program via Guardrails IV Fluids) ( Intravenous New Bag/Given 05/09/23 0845)     IMPRESSION / MDM / ASSESSMENT AND PLAN / ED COURSE  I reviewed the triage vital signs and the nursing notes.   Patient's presentation is most consistent with acute presentation with potential threat to life or bodily function.   Differential include acid reflux, GERD, anemia, CKD, GI bleed.    Labs show slightly elevated AST similar to prior.  BMP shows low potassium of 2.9 with elevated creatinine of 7.78 uptrending from prior.  Patient does report taking potassium pills.  Do not want to give too much additional potassium and the risk of causing hyperkalemia.  She can take an extra dose of oral potassium today.  Her hemoglobin has gone down to 5.4 from a baseline of 7.2.  I did do a rectal exam and there is no evidence of black stool Hemoccult negative.  She reports chronic anemia requiring 1 to 2 units of blood every so often.  This is related to her CKD.  Her troponin is slightly elevated.  Lipase is slightly elevated as well but she has no upper abdominal pain to suggest pancreatitis.  I recommended admission to the hospital for her anemia, worsening CKD but patient declined stating that she typically can get units of blood and go home.  She does not want to be admitted to the hospital given there is no indication for dialysis and she has been closely followed by nephrology I think this can be reasonable.  We will give 2 units of blood.  However if her troponin is uptrending significantly she understands that she would need to be  admitted to the hospital she expressed understanding felt comfortable with this plan  10:24 AM  Repeat troponin is downtrending.  I suspect slight elevation was related to patient's CKD.  She denies any chest pain.  She reports resolution of symptoms after GI cocktail medications.  She declines admission to the hospital.  Will refer her to cardiology.  She is also requesting for a new referral to oncology given she has not been seen there for her anemia since November she stated that she tried to make a follow-up  appointment with them and they stated that she needed a referral.  Again I discussed with patient admission versus going home but she preferred to be discharged.  Assuming patient tolerates both blood transfusion patient will be discharged home  The patient is on the cardiac monitor to evaluate for evidence of arrhythmia and/or significant heart rate changes.      FINAL CLINICAL IMPRESSION(S) / ED DIAGNOSES   Final diagnoses:  Symptomatic anemia  Chronic kidney disease, unspecified CKD stage  Gastroesophageal reflux disease, unspecified whether esophagitis present     Rx / DC Orders   ED Discharge Orders     None        Note:  This document was prepared using Dragon voice recognition software and may include unintentional dictation errors.   Concha Se, MD 05/09/23 [phone]

## 2023-05-09 NOTE — ED Triage Notes (Addendum)
 Patient reports epigastric pain/heartburn since Wednesday and has been taking tums but its just getting worse.  Reports took rolaids this am and had diarrhea. +nausea.  Hx HTN  Patient reports has cut back on drinking alcohol last drink Thursday but usually drinks a few shots a day.  Reports no blood in stool. Hx of anemia and blood transfusions.

## 2023-05-10 LAB — TYPE AND SCREEN
ABO/RH(D): O NEG
Antibody Screen: NEGATIVE
Unit division: 0
Unit division: 0

## 2023-05-10 LAB — BPAM RBC
Blood Product Expiration Date: 202505012359
Blood Product Expiration Date: 202505062359
ISSUE DATE / TIME: 202504050851
ISSUE DATE / TIME: 202504051159
Unit Type and Rh: 5100
Unit Type and Rh: 5100

## 2023-05-12 ENCOUNTER — Inpatient Hospital Stay: Attending: Internal Medicine

## 2023-05-12 ENCOUNTER — Inpatient Hospital Stay (HOSPITAL_BASED_OUTPATIENT_CLINIC_OR_DEPARTMENT_OTHER): Admitting: Internal Medicine

## 2023-05-12 ENCOUNTER — Telehealth: Payer: Self-pay | Admitting: *Deleted

## 2023-05-12 ENCOUNTER — Encounter: Payer: Self-pay | Admitting: Internal Medicine

## 2023-05-12 ENCOUNTER — Other Ambulatory Visit: Payer: Self-pay | Admitting: *Deleted

## 2023-05-12 VITALS — BP 120/86 | HR 95 | Temp 97.1°F | Resp 17 | Wt 172.0 lb

## 2023-05-12 DIAGNOSIS — D631 Anemia in chronic kidney disease: Secondary | ICD-10-CM | POA: Diagnosis present

## 2023-05-12 DIAGNOSIS — D649 Anemia, unspecified: Secondary | ICD-10-CM

## 2023-05-12 DIAGNOSIS — Z992 Dependence on renal dialysis: Secondary | ICD-10-CM | POA: Insufficient documentation

## 2023-05-12 DIAGNOSIS — N185 Chronic kidney disease, stage 5: Secondary | ICD-10-CM | POA: Insufficient documentation

## 2023-05-12 DIAGNOSIS — E876 Hypokalemia: Secondary | ICD-10-CM | POA: Diagnosis not present

## 2023-05-12 DIAGNOSIS — I120 Hypertensive chronic kidney disease with stage 5 chronic kidney disease or end stage renal disease: Secondary | ICD-10-CM | POA: Diagnosis present

## 2023-05-12 DIAGNOSIS — Z79899 Other long term (current) drug therapy: Secondary | ICD-10-CM | POA: Insufficient documentation

## 2023-05-12 LAB — CBC WITH DIFFERENTIAL (CANCER CENTER ONLY)
Abs Immature Granulocytes: 0.03 10*3/uL (ref 0.00–0.07)
Basophils Absolute: 0 10*3/uL (ref 0.0–0.1)
Basophils Relative: 1 %
Eosinophils Absolute: 0.1 10*3/uL (ref 0.0–0.5)
Eosinophils Relative: 2 %
HCT: 19.7 % — ABNORMAL LOW (ref 36.0–46.0)
Hemoglobin: 6.6 g/dL — CL (ref 12.0–15.0)
Immature Granulocytes: 1 %
Lymphocytes Relative: 26 %
Lymphs Abs: 1.4 10*3/uL (ref 0.7–4.0)
MCH: 28.6 pg (ref 26.0–34.0)
MCHC: 33.5 g/dL (ref 30.0–36.0)
MCV: 85.3 fL (ref 80.0–100.0)
Monocytes Absolute: 0.5 10*3/uL (ref 0.1–1.0)
Monocytes Relative: 9 %
Neutro Abs: 3.4 10*3/uL (ref 1.7–7.7)
Neutrophils Relative %: 61 %
Platelet Count: 163 10*3/uL (ref 150–400)
RBC: 2.31 MIL/uL — ABNORMAL LOW (ref 3.87–5.11)
RDW: 15.9 % — ABNORMAL HIGH (ref 11.5–15.5)
WBC Count: 5.5 10*3/uL (ref 4.0–10.5)
nRBC: 0 % (ref 0.0–0.2)

## 2023-05-12 LAB — IRON AND TIBC
Iron: 67 ug/dL (ref 28–170)
Saturation Ratios: 32 % — ABNORMAL HIGH (ref 10.4–31.8)
TIBC: 209 ug/dL — ABNORMAL LOW (ref 250–450)
UIBC: 142 ug/dL

## 2023-05-12 LAB — FERRITIN: Ferritin: 2123 ng/mL — ABNORMAL HIGH (ref 11–307)

## 2023-05-12 LAB — PREPARE RBC (CROSSMATCH)

## 2023-05-12 LAB — SAMPLE TO BLOOD BANK

## 2023-05-12 NOTE — Progress Notes (Unsigned)
 [facility] CONSULT NOTE  Patient Care Team: [facility], [facility] as PCP - General Earna Coder, MD as Consulting Physician (Oncology)  CHIEF COMPLAINTS/PURPOSE OF CONSULTATION: ANEMIA   HEMATOLOGY HISTORY  # ANEMIA[Hb; MCV-platelets- WBC; Iron sat; ferritin;  GFR- CT/US; EGD/colonoscopy-  # CKD- [Dr.]  HISTORY OF PRESENTING ILLNESS: Patient ambulating-independently. Alone.  Megan Lambert 53 y.o.  female pleasant patient with H xof CKD stage V- and prior hx of anemia-with a history of extreme noncompliance.  Follow-up of anemia.  Patient was seen in few months ago for blood transfusions.  However patient was lost to follow-up.  Patient was recently seen in the emergency room for hemoglobin around 6.  Needed blood transfusion.  Patient here for follow-up again.  Unfortunately patient is noncompliant with her visits to [facility] nephrology.   Review of Systems  Constitutional:  Positive for malaise/fatigue and weight loss. Negative for chills, diaphoresis and fever.  HENT:  Negative for nosebleeds and sore throat.   Eyes:  Negative for double vision.  Respiratory:  Positive for shortness of breath. Negative for cough, hemoptysis, sputum production and wheezing.   Cardiovascular:  Negative for chest pain, palpitations, orthopnea and leg swelling.  Gastrointestinal:  Positive for constipation. Negative for abdominal pain, blood in stool, diarrhea, heartburn, melena, nausea and vomiting.  Genitourinary:  Negative for dysuria, frequency and urgency.  Musculoskeletal:  Negative for back pain and joint pain.  Skin: Negative.  Negative for itching and rash.  Neurological:  Negative for dizziness, tingling, focal weakness, weakness and headaches.  Endo/Heme/Allergies:  Does not bruise/bleed easily.  Psychiatric/Behavioral:  Negative for depression. The patient is not nervous/anxious and does not have insomnia.      MEDICAL HISTORY:  Past Medical History:   Diagnosis Date   Anemia    Asthma    Hypertension     SURGICAL HISTORY: Past Surgical History:  Procedure Laterality Date   TUBAL LIGATION      SOCIAL HISTORY: Social History   Socioeconomic History   Marital status: Single    Spouse name: Not on file   Number of children: Not on file   Years of education: Not on file   Highest education level: Not on file  Occupational History   Not on file  Tobacco Use   Smoking status: Never    Passive exposure: Never   Smokeless tobacco: Never  Vaping Use   Vaping status: Never Used  Substance and Sexual Activity   Alcohol use: Yes    Comment: a few shots a day.   Drug use: No   Sexual activity: Not Currently  Other Topics Concern   Not on file  Social History Narrative   Not on file   Social Drivers of Health   Financial Resource Strain: Medium Risk (04/16/2022)   Received from [facility]   Overall Financial Resource Strain (CARDIA)    Difficulty of Paying Living Expenses: Somewhat hard  Food Insecurity: No Food Insecurity (10/20/2022)   Hunger Vital Sign    Worried About Running Out of Food in the Last Year: Never true    Ran Out of Food in the Last Year: Never true  Transportation Needs: No Transportation Needs (10/20/2022)   PRAPARE - Administrator, Civil Service (Medical): No    Lack of Transportation (Non-Medical): No  Physical Activity: Not on file  Stress: No Stress Concern Present (04/16/2022)   Received from [facility] -  Occupational Stress Questionnaire    Feeling of Stress : Only a little  Social Connections: Not on file  Intimate Partner Violence: Not At Risk (10/20/2022)   Humiliation, Afraid, Rape, and Kick questionnaire    Fear of Current or Ex-Partner: No    Emotionally Abused: No    Physically Abused: No    Sexually Abused: No    FAMILY HISTORY: Family History  Problem Relation Age of Onset   Anemia Mother     ALLERGIES:  is  allergic to lisinopril.  MEDICATIONS:  Current Outpatient Medications  Medication Sig Dispense Refill   amLODipine (NORVASC) 5 MG tablet Take 1 tablet by mouth daily.     ferrous sulfate 325 (65 FE) MG EC tablet Take 325 mg by mouth every other day.     folic acid (FOLVITE) 1 MG tablet Take 1 mg by mouth daily.     magnesium oxide (MAG-OX) 400 MG tablet Take 400 mg by mouth daily.     potassium chloride (KLOR-CON) 10 MEQ tablet Take 1 tablet by mouth daily.     Darbepoetin Alfa (ARANESP) 200 MCG/0.4ML SOSY injection Inject 0.4 mLs (200 mcg total) into the skin every 28 (twenty-eight) days. Inject 200 mcg aranesp syringe every 4 weeks . Hold if Hgb greater then 10. 4.8 mL 0   No current facility-administered medications for this visit.   Facility-Administered Medications Ordered in Other Visits  Medication Dose Route Frequency Provider Last Rate Last Admin   0.9 %  sodium chloride infusion (Manually program via Guardrails IV Fluids)  250 mL Intravenous Continuous Earna Coder, MD   Stopped at 05/13/23 1600     PHYSICAL EXAMINATION:   Vitals:   05/12/23 1514  BP: 120/86  Pulse: 95  Resp: 17  Temp: (!) 97.1 F (36.2 C)  SpO2: 100%    Filed Weights   05/12/23 1514  Weight: 172 lb (78 kg)    Appears pale.  Mild swelling in the legs.  Bilaterally.  Physical Exam Vitals and nursing note reviewed.  HENT:     Head: Normocephalic and atraumatic.     Mouth/Throat:     Pharynx: Oropharynx is clear.  Eyes:     Extraocular Movements: Extraocular movements intact.     Pupils: Pupils are equal, round, and reactive to light.  Cardiovascular:     Rate and Rhythm: Normal rate and regular rhythm.  Pulmonary:     Comments: Decreased breath sounds bilaterally.  Abdominal:     Palpations: Abdomen is soft.  Musculoskeletal:        General: Normal range of motion.     Cervical back: Normal range of motion.  Skin:    General: Skin is warm.  Neurological:     General: No  focal deficit present.     Mental Status: She is alert and oriented to person, place, and time.  Psychiatric:        Behavior: Behavior normal.        Judgment: Judgment normal.      LABORATORY DATA:  I have reviewed the data as listed Lab Results  Component Value Date   WBC 5.5 05/12/2023   HGB 6.6 (LL) 05/12/2023   HCT 19.7 (L) 05/12/2023   MCV 85.3 05/12/2023   PLT 163 05/12/2023   Recent Labs    10/20/22 1216 10/27/22 0845 11/18/22 0846 05/09/23 0728  NA 131* 135 134* 136  K 3.4* 3.5 3.4* 2.9*  CL 102 102 102 94*  CO2 15* 17* 15* 25  GLUCOSE 82 92 84 109*  BUN 55* 51* 46* 54*  CREATININE 6.36* 6.43* 6.21* 7.78*  CALCIUM 7.7* 7.3* 7.3* 9.1  GFRNONAA 7* 7* 7* 6*  PROT 7.5  --   --  7.6  ALBUMIN 3.7  --   --  4.0  AST 35  --   --  63*  ALT 18  --   --  29  ALKPHOS 93  --   --  77  BILITOT 0.5  --   --  0.9  BILIDIR  --   --   --  0.1  IBILI  --   --   --  0.8     DG Chest 2 View Result Date: 05/09/2023 CLINICAL DATA:  53 year old female with epigastric pain. EXAM: CHEST - 2 VIEW COMPARISON:  Chest radiograph 08/21/2021 and earlier. FINDINGS: PA and lateral views are 0754 hours. Lung volumes and mediastinal contours are stable, within normal limits. Visualized tracheal air column is within normal limits. Mild chronic elevation of the left hemidiaphragm is stable and compatible with normal variation. No pneumothorax, pulmonary edema, pleural effusion or confluent lung opacity. No acute osseous abnormality identified. Negative visible bowel gas. IMPRESSION: No acute cardiopulmonary abnormality. Electronically Signed   By: Odessa Fleming M.D.   On: 05/09/2023 07:57    ASSESSMENT & PLAN:   Symptomatic anemia # Chronic anemia-normocytic recently getting worse.  Patient is quite symptomatic from anemia . The etiology is likely chronic renal disease. Labs- 2024-sep:  Hb 6.0; I sat- 78%; ferritin- > 1700.  # Severe anemia- Hb 6.6- recommend 1 unit of PRBC tomorrow.   #  Discussed at length the pathophysiology of anemia from chronic kidney disease which includes decreased erythropoietin production; and decreased iron stores in the body.  I discussed that I would recommend using iron infusion/Venofer; along with Retacrit to maintain hemoglobin around 11  I would recommend the goal hemoglobin around 11 as there is a increased risk of thromboembolic events in the target hemoglobin is around 12 to 13.     # CKD stage V [s/p Kidney Bx [facility]; Dr.Mannivanan]-reluctant with Dialysis-/noncompliant with follow-up with nephrology.  Again strongly reminded the patient to follow-up with nephrology.  # Severe Hypokalemia: on supp as per ER.  # DISPOSITION: # 1 unit PRBC tomorrow- please order  # follow up TBD- Dr.B  Addendum: Discussed with preauthorization team-  she will need to receive treatment at home through [facility]. Will try to arrange as per insurance.  Will again schedule for 1 unit of PRBC in about 1 week to 10 days.   Cc; Dr.Mani-[facility]   All questions were answered. The patient knows to call the clinic with any problems, questions or concerns.    Earna Coder, MD 05/13/2023 4:36 PM

## 2023-05-12 NOTE — Assessment & Plan Note (Addendum)
 Chronic anemia-normocytic recently getting worse.  Patient is quite symptomatic from anemia . The etiology is likely chronic renal disease. Labs- 2024-sep:  Hb 6.0; I sat- 78%; ferritin- > 1700.  # Severe anemia- Hb 6.6- recommend 1 unit of PRBC.    # Discussed at length the pathophysiology of anemia from chronic kidney disease which includes decreased erythropoietin production; and decreased iron stores in the body.  I discussed that I would recommend using iron infusion/Venofer; along with Retacrit to maintain hemoglobin around 10.  I would recommend the goal hemoglobin less than 10 as there is a increased risk of thromboembolic events in the target hemoglobin is around 12 to 13.     I discussed the potential acute infusion reactions with IV iron; which are quite rare.  Patient understands the risk; will proceed with infusions.  Also discussed the role of Retacrit boosting the hemoglobin.    # CKD stage V [s/p Kidney Bx [facility]; Dr.Mannivanan]-reluctant with Dialysis- awaiting eval with nephro locally-  # Severe Hypokalemia: on supp  # DISPOSITION: # 1 unit PRBC tomorrow- please order  # follow up TBD- Dr.B

## 2023-05-12 NOTE — Progress Notes (Unsigned)
 Pt feeling so much better. Prior to getting blood she was very nauseated and was eating TUMS. She felt weak also. This week, she is feeling much better. Denies any lightheadedness this week. Pt still trying to work 40 hours per week. No visible blood in stool. Pt has been eating beef liver for the past couple of days.

## 2023-05-12 NOTE — Telephone Encounter (Signed)
 1517-Critical lab value called by Almeta Monas in [facility] lab. Hgb 6.6-Read back process performed with lab tech.  1518-critical value reported to Dr. Donneta Romberg. Read back process performed with md.

## 2023-05-13 ENCOUNTER — Encounter: Payer: Self-pay | Admitting: *Deleted

## 2023-05-13 ENCOUNTER — Encounter: Payer: Self-pay | Admitting: Internal Medicine

## 2023-05-13 ENCOUNTER — Inpatient Hospital Stay

## 2023-05-13 DIAGNOSIS — I120 Hypertensive chronic kidney disease with stage 5 chronic kidney disease or end stage renal disease: Secondary | ICD-10-CM | POA: Diagnosis not present

## 2023-05-13 DIAGNOSIS — D649 Anemia, unspecified: Secondary | ICD-10-CM

## 2023-05-13 MED ORDER — DIPHENHYDRAMINE HCL 25 MG PO CAPS
25.0000 mg | ORAL_CAPSULE | Freq: Once | ORAL | Status: AC
  Administered 2023-05-13: 25 mg via ORAL
  Filled 2023-05-13: qty 1

## 2023-05-13 MED ORDER — SODIUM CHLORIDE 0.9% IV SOLUTION
250.0000 mL | INTRAVENOUS | Status: DC
  Administered 2023-05-13: 250 mL via INTRAVENOUS
  Filled 2023-05-13: qty 250

## 2023-05-13 MED ORDER — ACETAMINOPHEN 325 MG PO TABS
650.0000 mg | ORAL_TABLET | Freq: Once | ORAL | Status: AC
  Administered 2023-05-13: 650 mg via ORAL
  Filled 2023-05-13: qty 2

## 2023-05-13 NOTE — Patient Instructions (Signed)

## 2023-05-14 ENCOUNTER — Telehealth: Payer: Self-pay | Admitting: *Deleted

## 2023-05-14 LAB — BPAM RBC
Blood Product Expiration Date: 202504152359
ISSUE DATE / TIME: 202504091415
Unit Type and Rh: 202504152359
Unit Type and Rh: 9500

## 2023-05-14 LAB — TYPE AND SCREEN
ABO/RH(D): O NEG
Antibody Screen: NEGATIVE
Unit division: 0

## 2023-05-14 NOTE — Telephone Encounter (Signed)
 RN made phone call to Rosann Auerbach trying to arrange Retacrit 40,000 unit injections to be delivered to patient at home as her insurance will not cover injections given in our facility. I was on the phone for 1 hr and 45 mins trying to arrange this. They initially approved 20,000 units. I am trying to get 40,000 units approved. I was able to give verbal order for 40,000 units/ 1 ml vials; 2 vials every 28 days with # 6 refills. Awaiting approval. I also tried to address safety issues concerning BP checks and hgb checks prior to pt self injecting. Pharmacy is supposed to call me back.

## 2023-05-15 ENCOUNTER — Telehealth: Payer: Self-pay | Admitting: *Deleted

## 2023-05-15 NOTE — Telephone Encounter (Signed)
 Called patient to inform her that I have been working with her Insurance, Rosann Auerbach trying to get procrit injections covered for her. The insurance states patient has to get the injections mailed to her at home vs getting in our clinic. The problem is she has a $2000.00 co pay. I requested [facility] proceed with getting co pay assistance. Alerted to [facility] will be reaching out to her regarding co pay assistance program and a pharmacy that can provide the medication.

## 2023-05-15 NOTE — Telephone Encounter (Signed)
 I learned this morning that [facility] pharmacy does not have retacrit 40, 000 units with no ETA. Pharmacy suggested getting a prescription for procrit as they have that in stock. I was told to recall [employer] for another PA for the procrit.  A PA was obtained for procrit PA- [id number] authorization obtained for 05/15/23 through 11/14/23. A verbal order was then called to [facility] 40,000 units procrit inject 1 ml vial of 40,000 units subcutaneously every 2 weeks. Amount 2 vials every 28 days with  # 6 refills. Pharmacist then tells me pt has a high co pay of $2,000.00 dollars. I informed her that this patient in no way can afford this co pay. She states there is co pay assistance for procrit. [facility] will reach out to patient to find a pharmacy and possible co pay assistance for this medication. At present time this prescription is on hold.

## 2023-05-19 ENCOUNTER — Other Ambulatory Visit: Payer: Self-pay

## 2023-05-19 DIAGNOSIS — D649 Anemia, unspecified: Secondary | ICD-10-CM

## 2023-05-20 ENCOUNTER — Other Ambulatory Visit: Payer: Self-pay

## 2023-05-20 ENCOUNTER — Telehealth: Payer: Self-pay | Admitting: *Deleted

## 2023-05-20 ENCOUNTER — Other Ambulatory Visit: Payer: Self-pay | Admitting: *Deleted

## 2023-05-20 ENCOUNTER — Inpatient Hospital Stay

## 2023-05-20 DIAGNOSIS — D649 Anemia, unspecified: Secondary | ICD-10-CM

## 2023-05-20 DIAGNOSIS — I120 Hypertensive chronic kidney disease with stage 5 chronic kidney disease or end stage renal disease: Secondary | ICD-10-CM | POA: Diagnosis not present

## 2023-05-20 LAB — CBC WITH DIFFERENTIAL (CANCER CENTER ONLY)
Abs Immature Granulocytes: 0.01 10*3/uL (ref 0.00–0.07)
Basophils Absolute: 0.1 10*3/uL (ref 0.0–0.1)
Basophils Relative: 1 %
Eosinophils Absolute: 0.2 10*3/uL (ref 0.0–0.5)
Eosinophils Relative: 4 %
HCT: 23.1 % — ABNORMAL LOW (ref 36.0–46.0)
Hemoglobin: 7.7 g/dL — ABNORMAL LOW (ref 12.0–15.0)
Immature Granulocytes: 0 %
Lymphocytes Relative: 29 %
Lymphs Abs: 1.4 10*3/uL (ref 0.7–4.0)
MCH: 28.4 pg (ref 26.0–34.0)
MCHC: 33.3 g/dL (ref 30.0–36.0)
MCV: 85.2 fL (ref 80.0–100.0)
Monocytes Absolute: 0.3 10*3/uL (ref 0.1–1.0)
Monocytes Relative: 6 %
Neutro Abs: 2.9 10*3/uL (ref 1.7–7.7)
Neutrophils Relative %: 60 %
Platelet Count: 193 10*3/uL (ref 150–400)
RBC: 2.71 MIL/uL — ABNORMAL LOW (ref 3.87–5.11)
RDW: 15.2 % (ref 11.5–15.5)
WBC Count: 4.9 10*3/uL (ref 4.0–10.5)
nRBC: 0 % (ref 0.0–0.2)

## 2023-05-20 LAB — PREPARE RBC (CROSSMATCH)

## 2023-05-20 NOTE — Telephone Encounter (Signed)
 Hgb 7.7 - patient will require 1 unit of blood tomorrow. Orders placed per Dr. Valentine Gasmen. Per Dr. Geraldene Kleine- no premeds needed.  I called patient to inform her of the lab results. 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  She does not have any other apts after tomorrow for lab ck. Dr.B made aware.

## 2023-05-20 NOTE — Telephone Encounter (Signed)
 Labs entered.

## 2023-05-20 NOTE — Telephone Encounter (Signed)
 Per Dr. Geraldene Kleine secure chat: Megan Lambert, she needs a f/u in 2 weeks with APP/labs cbc,bmp, hold tube and day 2-blood transfusion.

## 2023-05-21 ENCOUNTER — Inpatient Hospital Stay

## 2023-05-21 DIAGNOSIS — I120 Hypertensive chronic kidney disease with stage 5 chronic kidney disease or end stage renal disease: Secondary | ICD-10-CM | POA: Diagnosis not present

## 2023-05-21 DIAGNOSIS — D649 Anemia, unspecified: Secondary | ICD-10-CM

## 2023-05-21 MED ORDER — SODIUM CHLORIDE 0.9% IV SOLUTION
250.0000 mL | INTRAVENOUS | Status: DC
  Administered 2023-05-21: 100 mL via INTRAVENOUS
  Filled 2023-05-21: qty 250

## 2023-05-22 LAB — BPAM RBC
Blood Product Expiration Date: 202505202359
ISSUE DATE / TIME: 202504171155
Unit Type and Rh: 5100

## 2023-05-22 LAB — TYPE AND SCREEN
ABO/RH(D): O NEG
Antibody Screen: NEGATIVE
Unit division: 0

## 2023-06-02 ENCOUNTER — Inpatient Hospital Stay

## 2023-06-02 ENCOUNTER — Encounter: Payer: Self-pay | Admitting: Nurse Practitioner

## 2023-06-02 ENCOUNTER — Inpatient Hospital Stay (HOSPITAL_BASED_OUTPATIENT_CLINIC_OR_DEPARTMENT_OTHER): Admitting: Nurse Practitioner

## 2023-06-02 ENCOUNTER — Telehealth: Payer: Self-pay

## 2023-06-02 VITALS — BP 149/92 | HR 99 | Temp 97.8°F | Resp 20 | Wt 177.5 lb

## 2023-06-02 DIAGNOSIS — D649 Anemia, unspecified: Secondary | ICD-10-CM

## 2023-06-02 DIAGNOSIS — D631 Anemia in chronic kidney disease: Secondary | ICD-10-CM | POA: Diagnosis not present

## 2023-06-02 DIAGNOSIS — N179 Acute kidney failure, unspecified: Secondary | ICD-10-CM | POA: Insufficient documentation

## 2023-06-02 DIAGNOSIS — N185 Chronic kidney disease, stage 5: Secondary | ICD-10-CM | POA: Diagnosis not present

## 2023-06-02 DIAGNOSIS — I120 Hypertensive chronic kidney disease with stage 5 chronic kidney disease or end stage renal disease: Secondary | ICD-10-CM | POA: Diagnosis not present

## 2023-06-02 LAB — CBC WITH DIFFERENTIAL (CANCER CENTER ONLY)
Abs Immature Granulocytes: 0.02 10*3/uL (ref 0.00–0.07)
Basophils Absolute: 0 10*3/uL (ref 0.0–0.1)
Basophils Relative: 0 %
Eosinophils Absolute: 0.1 10*3/uL (ref 0.0–0.5)
Eosinophils Relative: 3 %
HCT: 24.3 % — ABNORMAL LOW (ref 36.0–46.0)
Hemoglobin: 8.2 g/dL — ABNORMAL LOW (ref 12.0–15.0)
Immature Granulocytes: 0 %
Lymphocytes Relative: 29 %
Lymphs Abs: 1.4 10*3/uL (ref 0.7–4.0)
MCH: 28.6 pg (ref 26.0–34.0)
MCHC: 33.7 g/dL (ref 30.0–36.0)
MCV: 84.7 fL (ref 80.0–100.0)
Monocytes Absolute: 0.3 10*3/uL (ref 0.1–1.0)
Monocytes Relative: 6 %
Neutro Abs: 2.8 10*3/uL (ref 1.7–7.7)
Neutrophils Relative %: 62 %
Platelet Count: 139 10*3/uL — ABNORMAL LOW (ref 150–400)
RBC: 2.87 MIL/uL — ABNORMAL LOW (ref 3.87–5.11)
RDW: 14.3 % (ref 11.5–15.5)
WBC Count: 4.6 10*3/uL (ref 4.0–10.5)
nRBC: 0 % (ref 0.0–0.2)

## 2023-06-02 LAB — BASIC METABOLIC PANEL WITH GFR
Anion gap: 15 (ref 5–15)
BUN: 63 mg/dL — ABNORMAL HIGH (ref 6–20)
CO2: 19 mmol/L — ABNORMAL LOW (ref 22–32)
Calcium: 8.7 mg/dL — ABNORMAL LOW (ref 8.9–10.3)
Chloride: 101 mmol/L (ref 98–111)
Creatinine, Ser: 9.06 mg/dL (ref 0.44–1.00)
GFR, Estimated: 5 mL/min — ABNORMAL LOW (ref >60)
Glucose, Bld: 91 mg/dL (ref 70–99)
Potassium: 3.3 mmol/L — ABNORMAL LOW (ref 3.5–5.1)
Sodium: 135 mmol/L (ref 135–145)

## 2023-06-02 LAB — SAMPLE TO BLOOD BANK

## 2023-06-02 NOTE — Progress Notes (Signed)
 [facility] CONSULT NOTE  Patient Care Team: Care, [facility] as PCP - General Gwyn Leos, MD as Consulting Physician (Oncology)  CHIEF COMPLAINTS/PURPOSE OF CONSULTATION: ANEMIA  HEMATOLOGY HISTORY       # ANEMIA [Hb; MCV-platelets- WBC; Iron  sat; ferritin;  GFR- CT/US ; EGD/colonoscopy. Hmg 08 April 2022.       # CKD- [Dr. Barbar Levine- [facility]]  HISTORY OF PRESENTING ILLNESS: Patient ambulating independently. Alone.  Megan Lambert 53 y.o. female pleasant patient with H xof CKD stage V, not on dialysis, and prior hx of anemia with extreme noncompliance, recently reestablished care at [facility] & Dr Valentine Gasmen following ER visit with hemoglobin of 5.4 a few weeks ago. She is s/p transfusion. She returns to clinic for follow up. She says that energy has improved since getting blood. She has previously declined dialysis. She has been followed by [facility] but last seen in September 2024. She was undergoing process of getting AV access. Continues to work 6-10 hours a day at [employer] . Weight is down. She likes to spend time with her grandchildren, particularly 30 year old. Lives with her mother and boyfriend.    Review of Systems  Constitutional:  Positive for malaise/fatigue and weight loss. Negative for chills, diaphoresis and fever.  HENT:  Negative for nosebleeds and sore throat.   Eyes:  Negative for double vision.  Respiratory:  Positive for shortness of breath. Negative for cough, hemoptysis, sputum production and wheezing.   Cardiovascular:  Negative for chest pain, palpitations, orthopnea and leg swelling.  Gastrointestinal:  Positive for constipation. Negative for abdominal pain, blood in stool, diarrhea, heartburn, melena, nausea and vomiting.  Genitourinary:  Negative for dysuria, frequency and urgency.  Musculoskeletal:  Negative for back pain and joint pain.  Skin: Negative.  Negative for itching and rash.   Neurological:  Negative for dizziness, tingling, focal weakness, weakness and headaches.  Endo/Heme/Allergies:  Does not bruise/bleed easily.  Psychiatric/Behavioral:  Negative for depression. The patient is not nervous/anxious and does not have insomnia.    MEDICAL HISTORY:  Past Medical History:  Diagnosis Date   Anemia    Asthma    Hypertension    SURGICAL HISTORY: Past Surgical History:  Procedure Laterality Date   TUBAL LIGATION     SOCIAL HISTORY: Social History   Socioeconomic History   Marital status: Single    Spouse name: Not on file   Number of children: Not on file   Years of education: Not on file   Highest education level: Not on file  Occupational History   Not on file  Tobacco Use   Smoking status: Never    Passive exposure: Never   Smokeless tobacco: Never  Vaping Use   Vaping status: Never Used  Substance and Sexual Activity   Alcohol use: Yes    Comment: a few shots a day.   Drug use: No   Sexual activity: Not Currently  Other Topics Concern   Not on file  Social History Narrative   Not on file   Social Drivers of Health   Financial Resource Strain: Medium Risk (04/16/2022)   Received from [facility]   Overall Financial Resource Strain (CARDIA)    Difficulty of Paying Living Expenses: Somewhat hard  Food Insecurity: No Food Insecurity (10/20/2022)   Hunger Vital Sign    Worried About Running Out of Food in the Last Year: Never true    Ran Out of Food in the Last Year:  Never true  Transportation Needs: No Transportation Needs (10/20/2022)   PRAPARE - Administrator, Civil Service (Medical): No    Lack of Transportation (Non-Medical): No  Physical Activity: Not on file  Stress: No Stress Concern Present (04/16/2022)   Received from [facility] of Occupational Health - Occupational Stress Questionnaire    Feeling of Stress : Only a little  Social Connections: Not on file  Intimate Partner Violence:  Not At Risk (10/20/2022)   Humiliation, Afraid, Rape, and Kick questionnaire    Fear of Current or Ex-Partner: No    Emotionally Abused: No    Physically Abused: No    Sexually Abused: No   FAMILY HISTORY: Family History  Problem Relation Age of Onset   Anemia Mother    ALLERGIES:  is allergic to lisinopril.  MEDICATIONS:  Current Outpatient Medications  Medication Sig Dispense Refill   amLODipine  (NORVASC ) 5 MG tablet Take 1 tablet by mouth daily.     Darbepoetin Alfa  (ARANESP ) 200 MCG/0.4ML SOSY injection Inject 0.4 mLs (200 mcg total) into the skin every 28 (twenty-eight) days. Inject 200 mcg aranesp  syringe every 4 weeks . Hold if Hgb greater then 10. 4.8 mL 0   ferrous sulfate 325 (65 FE) MG EC tablet Take 325 mg by mouth every other day.     folic acid  (FOLVITE ) 1 MG tablet Take 1 mg by mouth daily.     magnesium  oxide (MAG-OX) 400 MG tablet Take 400 mg by mouth daily.     potassium chloride  (KLOR-CON ) 10 MEQ tablet Take 1 tablet by mouth daily.     No current facility-administered medications for this visit.   PHYSICAL EXAMINATION: Vitals:   06/02/23 1013  BP: (!) 149/92  Pulse: 99  Resp: 20  Temp: 97.8 F (36.6 C)  SpO2: 100%   Filed Weights   06/02/23 1013  Weight: 177 lb 8 oz (80.5 kg)   Physical Exam Vitals reviewed.  Constitutional:      Appearance: She is not ill-appearing.  HENT:     Head: Normocephalic and atraumatic.     Mouth/Throat:     Pharynx: Oropharynx is clear.  Eyes:     General: No scleral icterus.    Extraocular Movements: Extraocular movements intact.  Cardiovascular:     Rate and Rhythm: Normal rate and regular rhythm.  Pulmonary:     Comments: Decreased breath sounds bilaterally.  Abdominal:     General: There is no distension.     Palpations: Abdomen is soft.  Musculoskeletal:        General: Normal range of motion.     Cervical back: Normal range of motion.     Right lower leg: Edema present.     Left lower leg: Edema  present.     Comments: Varicose veins; ambulating w/o aids  Skin:    General: Skin is warm.     Coloration: Skin is pale.  Neurological:     Mental Status: She is alert and oriented to person, place, and time.  Psychiatric:        Mood and Affect: Mood normal.        Behavior: Behavior normal.     LABORATORY DATA:  I have reviewed the data as listed Lab Results  Component Value Date   WBC 4.6 06/02/2023   HGB 8.2 (L) 06/02/2023   HCT 24.3 (L) 06/02/2023   MCV 84.7 06/02/2023   PLT 139 (L) 06/02/2023   Recent Labs  10/20/22 1216 10/27/22 0845 11/18/22 0846 05/09/23 0728 06/02/23 0937  NA 131*   < > 134* 136 135  K 3.4*   < > 3.4* 2.9* 3.3*  CL 102   < > 102 94* 101  CO2 15*   < > 15* 25 19*  GLUCOSE 82   < > 84 109* 91  BUN 55*   < > 46* 54* 63*  CREATININE 6.36*   < > 6.21* 7.78* 9.06*  CALCIUM  7.7*   < > 7.3* 9.1 8.7*  GFRNONAA 7*   < > 7* 6* 5*  PROT 7.5  --   --  7.6  --   ALBUMIN  3.7  --   --  4.0  --   AST 35  --   --  63*  --   ALT 18  --   --  29  --   ALKPHOS 93  --   --  77  --   BILITOT 0.5  --   --  0.9  --   BILIDIR  --   --   --  0.1  --   IBILI  --   --   --  0.8  --    < > = values in this interval not displayed.   Iron /TIBC/Ferritin/ %Sat    Component Value Date/Time   IRON  67 05/12/2023 1458   TIBC 209 (L) 05/12/2023 1458   FERRITIN 2,123 (H) 05/12/2023 1458   IRONPCTSAT 32 (H) 05/12/2023 1458   DG Chest 2 View Result Date: 05/09/2023 CLINICAL DATA:  53 year old female with epigastric pain. EXAM: CHEST - 2 VIEW COMPARISON:  Chest radiograph 08/21/2021 and earlier. FINDINGS: PA and lateral views are 0754 hours. Lung volumes and mediastinal contours are stable, within normal limits. Visualized tracheal air column is within normal limits. Mild chronic elevation of the left hemidiaphragm is stable and compatible with normal variation. No pneumothorax, pulmonary edema, pleural effusion or confluent lung opacity. No acute osseous abnormality  identified. Negative visible bowel gas. IMPRESSION: No acute cardiopulmonary abnormality. Electronically Signed   By: Marlise Simpers M.D.   On: 05/09/2023 07:57    ASSESSMENT & PLAN:   Symptomatic anemia # Chronic anemia-normocytic recently getting worsening. Quite symptomatic. Likely etiology is due to her worsening CKD. We again reviewed the pathophysiology of anemia due to CKD and the role for optimization of iron  stores and starting EPO. Iron  stores well replenished. Hold off on venofer for now. Last received Aranesp  200 mcg Oct 2024 then lost to follow up. Due to insurance, plan for procrit 40,000 units at home every 2 weeks, on hold however due to high copay. Patient has not heard back from insurance. Will check on status.   # Severe anemia- Hb 6.6- s/p 1 unit pRBCs. Hmg 8.2 today. Hold transfusion. Reviewed that we can monitor her counts in clinic for possible transfusions. Plan to check counts every 1-2 weeks with D2 possible transfusion for hemoglobin < 7.     # CKD stage V [s/p Kidney Bx [facility]; Dr.Mannivanan]- severe and progressive. Previously referred for transplant but has not followed up. Previously reluctant to undergo PD or HD and has been noncompliant with nephrology f/u. Today we discussed that GFR has worsened to 5, Cr > 9 today which is significantly worse compared to just 3 weeks ago when it was ~7. We discussed life threatening findings and I recommended ER and hospitalization. She is now in agreement however, prefers to go to [facility] ER due to being  established with [facility]. I have phoned ER and given report on findings.    # Severe Hypokalemia: on supp as per ER. K 3.3 today.    DISPOSITION: Hospital Plan to see her after discharge with labs Cancel transfusion for tomorrow- la     CC: Dr.Mani-Nephro-[facility]   No problem-specific Assessment & Plan notes found for this encounter.  All questions were answered. The patient knows to call the clinic with any problems,  questions or concerns.   Nelda Balsam, NP 06/02/2023

## 2023-06-02 NOTE — Telephone Encounter (Signed)
 Received message to contact lab.  Critical alert: Creatinine 9.06 (read back performed).  Provider alerted.

## 2023-06-03 ENCOUNTER — Telehealth: Payer: Self-pay | Admitting: Nurse Practitioner

## 2023-06-03 ENCOUNTER — Inpatient Hospital Stay

## 2023-06-03 NOTE — Telephone Encounter (Signed)
 Spoke to her mother yesterday afternoon who said she would encourage her to do so. Called patient this morning to check on her after chart review it appeared that she had not gone to ER. No answer and no vm available.

## 2023-06-15 ENCOUNTER — Encounter (INDEPENDENT_AMBULATORY_CARE_PROVIDER_SITE_OTHER): Admitting: Nurse Practitioner

## 2023-06-22 ENCOUNTER — Ambulatory Visit (INDEPENDENT_AMBULATORY_CARE_PROVIDER_SITE_OTHER): Admitting: Nurse Practitioner

## 2023-06-22 ENCOUNTER — Telehealth (INDEPENDENT_AMBULATORY_CARE_PROVIDER_SITE_OTHER): Payer: Self-pay | Admitting: Nurse Practitioner

## 2023-06-22 ENCOUNTER — Encounter (INDEPENDENT_AMBULATORY_CARE_PROVIDER_SITE_OTHER): Payer: Self-pay | Admitting: Nurse Practitioner

## 2023-06-22 VITALS — BP 113/72 | HR 105 | Resp 18 | Ht 72.0 in | Wt 171.6 lb

## 2023-06-22 DIAGNOSIS — I10 Essential (primary) hypertension: Secondary | ICD-10-CM

## 2023-06-22 DIAGNOSIS — N185 Chronic kidney disease, stage 5: Secondary | ICD-10-CM

## 2023-06-23 NOTE — Progress Notes (Signed)
 Subjective:    Patient ID: Megan Lambert, female    DOB: 09-29-1970, 53 y.o.   MRN: [id number] Chief Complaint  Patient presents with   New Patient (Initial Visit)    np. consult. US  done on 12/30/22    The patient is seen for evaluation for dialysis access. The patient has chronic renal insufficiency stage V secondary to hypertension. The patient's most recent creatinine clearance is less than 20. The patient volume status has not yet become an issue. Patient's blood pressures been relatively well controlled.  Recently it was noted that her nephrologist believes that she is going to need dialysis sooner rather than later.  The patient's noninvasive studies were done in December 2024 however she has been unable to follow-up for placement discussion and evaluation until now.  The patient notes the kidney problem has been present for a long time and has been progressively getting worse.  The patient is followed by nephrology.    The patient is right-handed.  The patient has been considering the various methods of dialysis and wishes to proceed with hemodialysis and therefore creation of AV access is indicated.  No recent shortening of the patient's walking distance or new symptoms consistent with claudication.  No history of rest pain symptoms. No new ulcers or wounds of the lower extremities have occurred.  The patient denies amaurosis fugax or recent TIA symptoms. There are no recent neurological changes noted. There is no history of DVT, PE or superficial thrombophlebitis. No recent episodes of angina or shortness of breath documented.   The patient has adequate access for creation of a right radiocephalic AV fistula.    Review of Systems  Cardiovascular:  Positive for leg swelling.  All other systems reviewed and are negative.      Objective:    Physical Exam Vitals reviewed.  HENT:     Head: Normocephalic.  Cardiovascular:     Rate and Rhythm: Tachycardia present.      Pulses:          Radial pulses are 2+ on the right side and 2+ on the left side.  Pulmonary:     Effort: Pulmonary effort is normal.  Skin:    General: Skin is warm and dry.  Neurological:     Mental Status: She is alert and oriented to person, place, and time.  Psychiatric:        Mood and Affect: Mood normal.        Behavior: Behavior normal.        Thought Content: Thought content normal.        Judgment: Judgment normal.     BP 113/72   Pulse (!) 105   Resp 18   Ht 6' (1.829 m)   Wt 171 lb 9.6 oz (77.8 kg)   LMP  (LMP Unknown)   BMI 23.27 kg/m   Past Medical History:  Diagnosis Date   Anemia    Asthma    Hypertension     Social History   Socioeconomic History   Marital status: Single    Spouse name: Not on file   Number of children: Not on file   Years of education: Not on file   Highest education level: Not on file  Occupational History   Not on file  Tobacco Use   Smoking status: Never    Passive exposure: Never   Smokeless tobacco: Never  Vaping Use   Vaping status: Never Used  Substance and Sexual Activity   Alcohol  use: Yes    Comment: a few shots a day.   Drug use: No   Sexual activity: Not Currently  Other Topics Concern   Not on file  Social History Narrative   Not on file   Social Drivers of Health   Financial Resource Strain: Medium Risk (04/16/2022)   Received from [facility]   Overall Financial Resource Strain (CARDIA)    Difficulty of Paying Living Expenses: Somewhat hard  Food Insecurity: No Food Insecurity (10/20/2022)   Hunger Vital Sign    Worried About Running Out of Food in the Last Year: Never true    Ran Out of Food in the Last Year: Never true  Transportation Needs: No Transportation Needs (10/20/2022)   PRAPARE - Administrator, Civil Service (Medical): No    Lack of Transportation (Non-Medical): No  Physical Activity: Not on file  Stress: No Stress Concern Present (04/16/2022)   Received from [facility] of Occupational Health - Occupational Stress Questionnaire    Feeling of Stress : Only a little  Social Connections: Not on file  Intimate Partner Violence: Not At Risk (10/20/2022)   Humiliation, Afraid, Rape, and Kick questionnaire    Fear of Current or Ex-Partner: No    Emotionally Abused: No    Physically Abused: No    Sexually Abused: No    Past Surgical History:  Procedure Laterality Date   TUBAL LIGATION      Family History  Problem Relation Age of Onset   Anemia Mother     Allergies  Allergen Reactions   Lisinopril Swelling       Latest Ref Rng & Units 06/02/2023    9:37 AM 05/20/2023    9:34 AM 05/12/2023    2:58 PM  CBC  WBC 4.0 - 10.5 K/uL 4.6  4.9  5.5   Hemoglobin 12.0 - 15.0 g/dL 8.2  7.7  6.6   Hematocrit 36.0 - 46.0 % 24.3  23.1  19.7   Platelets 150 - 400 K/uL 139  193  163        CMP     Component Value Date/Time   NA 135 06/02/2023 0937   NA 135 (L) 04/25/2013 0937   K 3.3 (L) 06/02/2023 0937   K 4.0 04/25/2013 0937   CL 101 06/02/2023 0937   CL 101 04/25/2013 0937   CO2 19 (L) 06/02/2023 0937   CO2 29 04/25/2013 0937   GLUCOSE 91 06/02/2023 0937   GLUCOSE 93 04/25/2013 0937   BUN 63 (H) 06/02/2023 0937   BUN 8 04/25/2013 0937   CREATININE 9.06 (HH) 06/02/2023 0937   CREATININE 6.21 (H) 11/18/2022 0846   CREATININE 1.05 04/25/2013 0937   CALCIUM  8.7 (L) 06/02/2023 0937   CALCIUM  8.4 (L) 04/25/2013 0937   PROT 7.6 05/09/2023 0728   PROT 8.1 04/25/2013 0937   ALBUMIN  4.0 05/09/2023 0728   ALBUMIN  4.2 04/25/2013 0937   AST 63 (H) 05/09/2023 0728   AST 111 (H) 04/25/2013 0937   ALT 29 05/09/2023 0728   ALT 84 (H) 04/25/2013 0937   ALKPHOS 77 05/09/2023 0728   ALKPHOS 70 04/25/2013 0937   BILITOT 0.9 05/09/2023 0728   BILITOT 0.7 04/25/2013 0937   GFRNONAA 5 (L) 06/02/2023 0937   GFRNONAA 7 (L) 11/18/2022 0846   GFRNONAA >60 04/25/2013 0937     No results found.     Assessment & Plan:   1.  Chronic kidney disease, stage  5 (HCC) (Primary) Based on the patient's noninvasive studies she has adequate access for creation of a right radiocephalic AV fistula.  We had a discussion that we have is found during surgery that her vein is calcific or atrophied, she will likely have placement of a brachiocephalic AV fistula.  We discussed the risk, benefits and alternatives and she is agreeable to proceed with surgery.  We also discussed that the patient will likely need to have dialysis prior to the fistula being matured.  This would necessitate placement of a PermCath.  I discussed extensively with the patient why, progression needs to come directly from her nephrologist before we can place this because it needs to be utilized shortly after placement and outpatient dialysis will need to be arranged.  With this concern, I reached out to her nephrologist directly and had an extensive conversation regarding the patient's status.  At this time her nephrologist does not feel comfortable starting her on dialysis in an outpatient basis I would recommend inpatient initiation.  She does note that she has an upcoming evaluation in the next week or so and if her labs have improved she may reconsider.  She will reach out sooner less if there is a change in plan regarding her PermCath placement.   2. Hypertension, unspecified type Continue antihypertensive medications as already ordered, these medications have been reviewed and there are no changes at this time.   Current Outpatient Medications on File Prior to Visit  Medication Sig Dispense Refill   amLODipine  (NORVASC ) 5 MG tablet Take 1 tablet by mouth daily.     Darbepoetin Alfa  (ARANESP ) 200 MCG/0.4ML SOSY injection Inject 0.4 mLs (200 mcg total) into the skin every 28 (twenty-eight) days. Inject 200 mcg aranesp  syringe every 4 weeks . Hold if Hgb greater then 10. 4.8 mL 0   ferrous sulfate 325 (65 FE) MG EC tablet Take 325 mg by mouth every other day.      folic acid  (FOLVITE ) 1 MG tablet Take 1 mg by mouth daily.     potassium chloride  (KLOR-CON ) 10 MEQ tablet Take 1 tablet by mouth daily.     No current facility-administered medications on file prior to visit.    There are no Patient Instructions on file for this visit. No follow-ups on file.   Milia Warth E Maiah Sinning, NP

## 2023-06-24 ENCOUNTER — Telehealth (INDEPENDENT_AMBULATORY_CARE_PROVIDER_SITE_OTHER): Payer: Self-pay

## 2023-06-24 ENCOUNTER — Other Ambulatory Visit: Payer: Self-pay

## 2023-06-24 ENCOUNTER — Emergency Department
Admission: EM | Admit: 2023-06-24 | Discharge: 2023-06-24 | Disposition: A | Discharge status: Home / Self Care | Attending: Emergency Medicine | Admitting: Emergency Medicine

## 2023-06-24 ENCOUNTER — Encounter: Payer: Self-pay | Admitting: Emergency Medicine

## 2023-06-24 DIAGNOSIS — Z452 Encounter for adjustment and management of vascular access device: Secondary | ICD-10-CM | POA: Diagnosis present

## 2023-06-24 DIAGNOSIS — Z5321 Procedure and treatment not carried out due to patient leaving prior to being seen by health care provider: Secondary | ICD-10-CM | POA: Insufficient documentation

## 2023-06-24 LAB — COMPREHENSIVE METABOLIC PANEL WITH GFR
ALT: 22 U/L (ref 0–44)
AST: 38 U/L (ref 15–41)
Albumin: 4 g/dL (ref 3.5–5.0)
Alkaline Phosphatase: 93 U/L (ref 38–126)
Anion gap: 17 — ABNORMAL HIGH (ref 5–15)
BUN: 69 mg/dL — ABNORMAL HIGH (ref 6–20)
CO2: 14 mmol/L — ABNORMAL LOW (ref 22–32)
Calcium: 9 mg/dL (ref 8.9–10.3)
Chloride: 100 mmol/L (ref 98–111)
Creatinine, Ser: 8.58 mg/dL — ABNORMAL HIGH (ref 0.44–1.00)
GFR, Estimated: 5 mL/min — ABNORMAL LOW (ref >60)
Glucose, Bld: 100 mg/dL — ABNORMAL HIGH (ref 70–99)
Potassium: 3.3 mmol/L — ABNORMAL LOW (ref 3.5–5.1)
Sodium: 131 mmol/L — ABNORMAL LOW (ref 135–145)
Total Bilirubin: 0.9 mg/dL (ref 0.0–1.2)
Total Protein: 7.4 g/dL (ref 6.5–8.1)

## 2023-06-24 LAB — CBC
HCT: 19.8 % — ABNORMAL LOW (ref 36.0–46.0)
Hemoglobin: 6.8 g/dL — ABNORMAL LOW (ref 12.0–15.0)
MCH: 28.1 pg (ref 26.0–34.0)
MCHC: 34.3 g/dL (ref 30.0–36.0)
MCV: 81.8 fL (ref 80.0–100.0)
Platelets: 141 10*3/uL — ABNORMAL LOW (ref 150–400)
RBC: 2.42 MIL/uL — ABNORMAL LOW (ref 3.87–5.11)
RDW: 14.4 % (ref 11.5–15.5)
WBC: 6.8 10*3/uL (ref 4.0–10.5)
nRBC: 0 % (ref 0.0–0.2)

## 2023-06-24 NOTE — Telephone Encounter (Addendum)
 Patient reach out to the office concerning that her kidney provider prefer patient to go to the ED so she can start dialysis. Patient was made aware that Wauneta Haddock NP and kidney provider has discuss plan of care to start dialysis. Patient will contact kidney provider before going to the ED.

## 2023-06-24 NOTE — ED Triage Notes (Signed)
 Patient to ED via POV for dialysis catheter placement. Denies pain or complaints at this time.

## 2023-06-26 ENCOUNTER — Emergency Department
Admission: EM | Admit: 2023-06-26 | Discharge: 2023-06-26 | Discharge status: Against Medical Advice | Attending: Emergency Medicine | Admitting: Emergency Medicine

## 2023-06-26 ENCOUNTER — Other Ambulatory Visit: Payer: Self-pay

## 2023-06-26 DIAGNOSIS — N185 Chronic kidney disease, stage 5: Secondary | ICD-10-CM | POA: Diagnosis present

## 2023-06-26 LAB — CBC WITH DIFFERENTIAL/PLATELET
Abs Immature Granulocytes: 0.02 10*3/uL (ref 0.00–0.07)
Basophils Absolute: 0.1 10*3/uL (ref 0.0–0.1)
Basophils Relative: 1 %
Eosinophils Absolute: 0.2 10*3/uL (ref 0.0–0.5)
Eosinophils Relative: 3 %
HCT: 21.4 % — ABNORMAL LOW (ref 36.0–46.0)
Hemoglobin: 7.3 g/dL — ABNORMAL LOW (ref 12.0–15.0)
Immature Granulocytes: 0 %
Lymphocytes Relative: 27 %
Lymphs Abs: 1.3 10*3/uL (ref 0.7–4.0)
MCH: 27.9 pg (ref 26.0–34.0)
MCHC: 34.1 g/dL (ref 30.0–36.0)
MCV: 81.7 fL (ref 80.0–100.0)
Monocytes Absolute: 0.3 10*3/uL (ref 0.1–1.0)
Monocytes Relative: 6 %
Neutro Abs: 3.1 10*3/uL (ref 1.7–7.7)
Neutrophils Relative %: 63 %
Platelets: 130 10*3/uL — ABNORMAL LOW (ref 150–400)
RBC: 2.62 MIL/uL — ABNORMAL LOW (ref 3.87–5.11)
RDW: 14.5 % (ref 11.5–15.5)
WBC: 4.9 10*3/uL (ref 4.0–10.5)
nRBC: 0 % (ref 0.0–0.2)

## 2023-06-26 LAB — COMPREHENSIVE METABOLIC PANEL WITH GFR
ALT: 23 U/L (ref 0–44)
AST: 39 U/L (ref 15–41)
Albumin: 4 g/dL (ref 3.5–5.0)
Alkaline Phosphatase: 102 U/L (ref 38–126)
Anion gap: 17 — ABNORMAL HIGH (ref 5–15)
BUN: 71 mg/dL — ABNORMAL HIGH (ref 6–20)
CO2: 17 mmol/L — ABNORMAL LOW (ref 22–32)
Calcium: 8.9 mg/dL (ref 8.9–10.3)
Chloride: 96 mmol/L — ABNORMAL LOW (ref 98–111)
Creatinine, Ser: 8.56 mg/dL — ABNORMAL HIGH (ref 0.44–1.00)
GFR, Estimated: 5 mL/min — ABNORMAL LOW (ref >60)
Glucose, Bld: 91 mg/dL (ref 70–99)
Potassium: 3.8 mmol/L (ref 3.5–5.1)
Sodium: 130 mmol/L — ABNORMAL LOW (ref 135–145)
Total Bilirubin: 0.5 mg/dL (ref 0.0–1.2)
Total Protein: 7.8 g/dL (ref 6.5–8.1)

## 2023-06-26 LAB — URINALYSIS, ROUTINE W REFLEX MICROSCOPIC
Bilirubin Urine: NEGATIVE
Glucose, UA: NEGATIVE mg/dL
Ketones, ur: NEGATIVE mg/dL
Nitrite: NEGATIVE
Protein, ur: 30 mg/dL — AB
Specific Gravity, Urine: 1.01 (ref 1.005–1.030)
pH: 6 (ref 5.0–8.0)

## 2023-06-26 NOTE — ED Provider Notes (Signed)
 [facility] Provider Note    Event Date/Time   First MD Initiated Contact with Patient 06/26/23 1251     (approximate)   History   Port Placement   HPI  Megan Lambert is a 53 y.o. female  with history of stage 5 kidney disease in need of dialysis and as listed in EMR presents to the emergency department for temporary dialysis catheter placement and subsequent dialysis. Patient has been evaluated by nephrology at [facility] and by vascular here in [place]. She currently denies symptoms of concern including weight gain, fluid retention, shortness of breath. She is still making urine about 4 times per day and reports it is clear/yellow.      Physical Exam   Triage Vital Signs: ED Triage Vitals  Encounter Vitals Group     BP 06/26/23 1217 (!) 149/96     Systolic BP Percentile --      Diastolic BP Percentile --      Pulse Rate 06/26/23 1217 (!) 111     Resp 06/26/23 1217 18     Temp 06/26/23 1217 98.1 F (36.7 C)     Temp Source 06/26/23 1217 Oral     SpO2 06/26/23 1217 100 %     Weight 06/26/23 1216 172 lb (78 kg)     Height 06/26/23 1216 6' (1.829 m)     Head Circumference --      Peak Flow --      Pain Score 06/26/23 1216 0     Pain Loc --      Pain Education --      Exclude from Growth Chart --     Most recent vital signs: Vitals:   06/26/23 1217  BP: (!) 149/96  Pulse: (!) 111  Resp: 18  Temp: 98.1 F (36.7 C)  SpO2: 100%    General: Awake, no distress.  CV:  Good peripheral perfusion. Resp:  Normal effort.  Abd:  No distention.  Other:     ED Results / Procedures / Treatments   Labs (all labs ordered are listed, but only abnormal results are displayed) Labs Reviewed  CBC WITH DIFFERENTIAL/PLATELET - Abnormal; Notable for the following components:      Result Value   RBC 2.62 (*)    Hemoglobin 7.3 (*)    HCT 21.4 (*)    Platelets 130 (*)    All other components within normal limits  COMPREHENSIVE METABOLIC PANEL  WITH GFR - Abnormal; Notable for the following components:   Sodium 130 (*)    Chloride 96 (*)    CO2 17 (*)    BUN 71 (*)    Creatinine, Ser 8.56 (*)    GFR, Estimated 5 (*)    Anion gap 17 (*)    All other components within normal limits  URINALYSIS, ROUTINE W REFLEX MICROSCOPIC - Abnormal; Notable for the following components:   Color, Urine YELLOW (*)    APPearance CLOUDY (*)    Hgb urine dipstick SMALL (*)    Protein, ur 30 (*)    Leukocytes,Ua SMALL (*)    Bacteria, UA RARE (*)    All other components within normal limits     EKG  Patient left prior to completion    RADIOLOGY  Image and radiology report reviewed and interpreted by me. Radiology report consistent with the same.  N/a  PROCEDURES:  Critical Care performed: No  Procedures   MEDICATIONS ORDERED IN ED:  Medications - No data to display  IMPRESSION / MDM / ASSESSMENT AND PLAN / ED COURSE   I have reviewed the triage note.  Differential diagnosis includes, but is not limited to, CKD in need of dialysis, CKD  Patient's presentation is most consistent with acute complicated illness / injury requiring diagnostic workup.  53 year old female presenting to the emergency department for placement of Vas-Cath with intent of dialysis today or tomorrow.  See HPI for further details.  Message sent to Dr. Prescilla Brod and Dr. Erminio Hazy.  Plan will be to insert temporary cath right away. NP with vascular had gone to see patient and get consent, however she was not in the room. She had reported to the nursing staff that she was going to help her mom to the car and would be right back.  Clinical Course as of 06/26/23 1613  Fri Jun 26, 2023  1450 Patient left the room after plan for temporary dialysis catheter was discussed. Multiple attempts to locate her have been made without success.   [CT]    Clinical Course User Index [CT] Hayle Parisi B, FNP     FINAL CLINICAL IMPRESSION(S) / ED DIAGNOSES   Final  diagnoses:  Stage 5 chronic kidney disease not on chronic dialysis (HCC)     Rx / DC Orders   ED Discharge Orders     None        Note:  This document was prepared using Dragon voice recognition software and may include unintentional dictation errors.   Sherryle Don, FNP 06/26/23 1613    Lubertha Rush, MD 06/27/23 1057

## 2023-06-26 NOTE — ED Notes (Signed)
 Pt went to lobby to walk her mother out.  Vascular NP in to see pt  She is not in room This nurse attempted to cal the pt phone 4 times w/o answer  Also ED tech went to lobby to find pt

## 2023-06-26 NOTE — ED Triage Notes (Signed)
 Pt arrived via POV for Dialysis catheter placement. Pt denies any pain or complaints.

## 2023-07-03 ENCOUNTER — Other Ambulatory Visit: Payer: Self-pay | Admitting: *Deleted

## 2023-07-03 ENCOUNTER — Telehealth: Payer: Self-pay | Admitting: *Deleted

## 2023-07-03 DIAGNOSIS — D631 Anemia in chronic kidney disease: Secondary | ICD-10-CM

## 2023-07-03 NOTE — Telephone Encounter (Signed)
 The nephrology wanted the patient to have 2 units blood next week. Pt, is at 6.0 hemoglobin.The only time we have next week in Thursday. I called back to Dr. Barbar Levine and let her know that that is the only day that we can do the 2 units. She is ok with and I called the patient and told her that she will need to be here on 6 /4 at 10:15 to get labs and then see NP . Then the 6/5 the patient will need to be here at 8 am. The patient says she will come over on the 2 days above

## 2023-07-06 ENCOUNTER — Telehealth (INDEPENDENT_AMBULATORY_CARE_PROVIDER_SITE_OTHER): Payer: Self-pay

## 2023-07-06 NOTE — Telephone Encounter (Signed)
 Dr Barbar Levine reach out to the office requesting for the patient to be scheduled for perm cath insertion after 07/09/23 to start dialysis. Patient is scheduled for blood transfusion 07/09/23. Patient is scheduled to start dialysis on 07/21/23. Provider is requesting for the patient to scheduled before 07/21/23.

## 2023-07-07 DIAGNOSIS — Z992 Dependence on renal dialysis: Secondary | ICD-10-CM | POA: Insufficient documentation

## 2023-07-07 DIAGNOSIS — N119 Chronic tubulo-interstitial nephritis, unspecified: Secondary | ICD-10-CM | POA: Insufficient documentation

## 2023-07-07 DIAGNOSIS — I120 Hypertensive chronic kidney disease with stage 5 chronic kidney disease or end stage renal disease: Secondary | ICD-10-CM | POA: Insufficient documentation

## 2023-07-08 ENCOUNTER — Other Ambulatory Visit: Payer: Self-pay

## 2023-07-08 ENCOUNTER — Inpatient Hospital Stay (HOSPITAL_BASED_OUTPATIENT_CLINIC_OR_DEPARTMENT_OTHER): Admitting: Hospice and Palliative Medicine

## 2023-07-08 ENCOUNTER — Other Ambulatory Visit: Payer: Self-pay | Admitting: *Deleted

## 2023-07-08 ENCOUNTER — Encounter: Payer: Self-pay | Admitting: Hospice and Palliative Medicine

## 2023-07-08 ENCOUNTER — Inpatient Hospital Stay: Attending: Internal Medicine

## 2023-07-08 VITALS — BP 122/87 | HR 101 | Temp 97.8°F | Resp 18 | Ht 72.0 in | Wt 172.5 lb

## 2023-07-08 DIAGNOSIS — I120 Hypertensive chronic kidney disease with stage 5 chronic kidney disease or end stage renal disease: Secondary | ICD-10-CM | POA: Diagnosis present

## 2023-07-08 DIAGNOSIS — D631 Anemia in chronic kidney disease: Secondary | ICD-10-CM

## 2023-07-08 DIAGNOSIS — N185 Chronic kidney disease, stage 5: Secondary | ICD-10-CM

## 2023-07-08 DIAGNOSIS — D649 Anemia, unspecified: Secondary | ICD-10-CM

## 2023-07-08 LAB — CBC (CANCER CENTER ONLY)
HCT: 17.8 % — ABNORMAL LOW (ref 36.0–46.0)
Hemoglobin: 6 g/dL — CL (ref 12.0–15.0)
MCH: 27.6 pg (ref 26.0–34.0)
MCHC: 33.7 g/dL (ref 30.0–36.0)
MCV: 82 fL (ref 80.0–100.0)
Platelet Count: 150 10*3/uL (ref 150–400)
RBC: 2.17 MIL/uL — ABNORMAL LOW (ref 3.87–5.11)
RDW: 14.5 % (ref 11.5–15.5)
WBC Count: 4.2 10*3/uL (ref 4.0–10.5)
nRBC: 0 % (ref 0.0–0.2)

## 2023-07-08 LAB — PREPARE RBC (CROSSMATCH)

## 2023-07-08 NOTE — Progress Notes (Signed)
 [facility] at [facility] [phone] [phone]  Patient Care Team: [facility] as PCP - General Gwyn Leos, MD as Consulting Physician (Oncology)   NAME OF PATIENT: Megan Lambert  [id number]  [birth year]   DATE OF VISIT: 07/08/23  REASON FOR CONSULT: Seda Kronberg is a 53 y.o. female with multiple medical problems including ESRD not yet on dialysis, anemia, and history of noncompliance with treatments.   INTERVAL HISTORY: Patient last seen in clinic on 06/02/2023 by Kenney Peacemaker, NP.  At that visit, patient was sent to the emergency department due to acute worsening of serum creatinine.  Patient subsequently in emergency department on 06/26/2023 with placement of Vas-Cath to start dialysis.  However, it appears that access was not placed.  Patient presents to [facility] today by request of nephrology for consideration of blood transfusion.  Today, patient states she is feeling well.  She is still working daily.  Denies significant fatigue or shortness of breath or chest pain.  Denies bleeding, or dark tarry stools.  Patient says that she has scheduled follow-up with her nephrologist who plans to proceed with vascular access after patient receives transfusion.  Reportedly, hemodialysis is planned/scheduled within the next week.  Denies any neurologic complaints. Denies recent fevers or illnesses. Denies any easy bleeding or bruising. Reports good appetite and denies weight loss. Denies chest pain. Denies any nausea, vomiting, constipation, or diarrhea. Denies urinary complaints. Patient offers no further specific complaints today.   PAST MEDICAL HISTORY: Past Medical History:  Diagnosis Date   Anemia    Asthma    Hypertension     PAST SURGICAL HISTORY:  Past Surgical History:  Procedure Laterality Date   TUBAL LIGATION      HEMATOLOGY/ONCOLOGY HISTORY:  Oncology History   No history  exists.    ALLERGIES:  is allergic to lisinopril.  MEDICATIONS:  Current Outpatient Medications  Medication Sig Dispense Refill   amLODipine  (NORVASC ) 5 MG tablet Take 1 tablet by mouth daily.     Darbepoetin Alfa  (ARANESP ) 200 MCG/0.4ML SOSY injection Inject 0.4 mLs (200 mcg total) into the skin every 28 (twenty-eight) days. Inject 200 mcg aranesp  syringe every 4 weeks . Hold if Hgb greater then 10. 4.8 mL 0   ferrous sulfate 325 (65 FE) MG EC tablet Take 325 mg by mouth every other day.     folic acid  (FOLVITE ) 1 MG tablet Take 1 mg by mouth daily.     metroNIDAZOLE  (FLAGYL ) 500 MG tablet Take 500 mg by mouth 2 (two) times daily.     potassium chloride  (KLOR-CON ) 10 MEQ tablet Take 1 tablet by mouth daily.     sodium bicarbonate 650 MG tablet Take 1,950 mg by mouth daily.     magnesium  oxide (MAG-OX) 400 MG tablet Take 400 mg by mouth daily.     No current facility-administered medications for this visit.    VITAL SIGNS: BP 122/87   Pulse (!) 101   Temp 97.8 F (36.6 C) (Tympanic)   Resp 18   Ht 6' (1.829 m)   Wt 172 lb 8 oz (78.2 kg)   LMP  (LMP Unknown)   BMI 23.40 kg/m  Filed Weights   07/08/23 1045  Weight: 172 lb 8 oz (78.2 kg)    Estimated body mass index is 23.4 kg/m as calculated from the following:   Height as of this encounter: 6' (1.829 m).   Weight as of this encounter: 172 lb 8  oz (78.2 kg).  LABS: CBC:    Component Value Date/Time   WBC 4.2 07/08/2023 1025   WBC 4.9 06/26/2023 1356   HGB 6.0 (LL) 07/08/2023 1025   HGB 12.3 04/25/2013 0937   HCT 17.8 (L) 07/08/2023 1025   HCT 37.5 04/25/2013 0937   PLT 150 07/08/2023 1025   PLT 198 04/25/2013 0937   MCV 82.0 07/08/2023 1025   MCV 85 04/25/2013 0937   NEUTROABS 3.1 06/26/2023 1356   NEUTROABS 4.4 04/25/2013 0937   LYMPHSABS 1.3 06/26/2023 1356   LYMPHSABS 1.9 04/25/2013 0937   MONOABS 0.3 06/26/2023 1356   MONOABS 0.5 04/25/2013 0937   EOSABS 0.2 06/26/2023 1356   EOSABS 0.1 04/25/2013 0937    BASOSABS 0.1 06/26/2023 1356   BASOSABS 0.1 04/25/2013 0937   Comprehensive Metabolic Panel:    Component Value Date/Time   NA 130 (L) 06/26/2023 1356   NA 135 (L) 04/25/2013 0937   K 3.8 06/26/2023 1356   K 4.0 04/25/2013 0937   CL 96 (L) 06/26/2023 1356   CL 101 04/25/2013 0937   CO2 17 (L) 06/26/2023 1356   CO2 29 04/25/2013 0937   BUN 71 (H) 06/26/2023 1356   BUN 8 04/25/2013 0937   CREATININE 8.56 (H) 06/26/2023 1356   CREATININE 6.21 (H) 11/18/2022 0846   CREATININE 1.05 04/25/2013 0937   GLUCOSE 91 06/26/2023 1356   GLUCOSE 93 04/25/2013 0937   CALCIUM  8.9 06/26/2023 1356   CALCIUM  8.4 (L) 04/25/2013 0937   AST 39 06/26/2023 1356   AST 111 (H) 04/25/2013 0937   ALT 23 06/26/2023 1356   ALT 84 (H) 04/25/2013 0937   ALKPHOS 102 06/26/2023 1356   ALKPHOS 70 04/25/2013 0937   BILITOT 0.5 06/26/2023 1356   BILITOT 0.7 04/25/2013 0937   PROT 7.8 06/26/2023 1356   PROT 8.1 04/25/2013 0937   ALBUMIN  4.0 06/26/2023 1356   ALBUMIN  4.2 04/25/2013 0937    RADIOGRAPHIC STUDIES: No results found.  PERFORMANCE STATUS (ECOG) : 0 - Asymptomatic  Review of Systems Unless otherwise noted, a complete review of systems is negative.  Physical Exam General: NAD Cardiovascular: regular rate and rhythm Pulmonary: clear ant fields Abdomen: soft, nontender, + bowel sounds GU: no suprapubic tenderness Extremities: no edema, no joint deformities Skin: no rashes Neurological: nonfocal  IMPRESSION/PLAN: ESRD -not yet on dialysis.  Followed by [facility] nephrology.  Severe anemia - Likely secondary to CKD. Previous plan was to start on Procrit but this was on hold due to high co-pay.  Hemoglobin 6.0 today.  Proceed with transfusion of 2 units PRBC tomorrow.  It does not appear that patient has had GI workup, although no overt s/s of bleeding.  Patient is interested in colonoscopy.  Will send referral.  ED triggers reviewed.   Case and plan discussed with Dr. Valentine Gasmen.  Patient to  have follow-up with NP in 3 to 4 weeks   Patient expressed understanding and was in agreement with this plan. She also understands that She can call clinic at any time with any questions, concerns, or complaints.   Thank you for allowing me to participate in the care of this very pleasant patient.   Time Total: 15 minutes  Visit consisted of counseling and education dealing with the complex and emotionally intense issues of symptom management in the setting of serious illness.Greater than 50%  of this time was spent counseling and coordinating care related to the above assessment and plan.  Signed by: Gerilyn Kobus, PhD, NP-C

## 2023-07-08 NOTE — Progress Notes (Signed)
 Critical value hgb-6.0- [facility] in cancer center lab called to Darrell Else at 1058 am. Read back process performed with lab tech.  Lilian Register, NP informed of critical value 11am. Patient to receive 2 units of blood tomorrow as planned.

## 2023-07-09 ENCOUNTER — Telehealth (INDEPENDENT_AMBULATORY_CARE_PROVIDER_SITE_OTHER): Payer: Self-pay

## 2023-07-09 ENCOUNTER — Telehealth (INDEPENDENT_AMBULATORY_CARE_PROVIDER_SITE_OTHER): Payer: Self-pay | Admitting: Vascular Surgery

## 2023-07-09 ENCOUNTER — Inpatient Hospital Stay

## 2023-07-09 VITALS — BP 149/89 | HR 102 | Temp 97.9°F | Resp 16

## 2023-07-09 DIAGNOSIS — I120 Hypertensive chronic kidney disease with stage 5 chronic kidney disease or end stage renal disease: Secondary | ICD-10-CM | POA: Diagnosis not present

## 2023-07-09 DIAGNOSIS — D631 Anemia in chronic kidney disease: Secondary | ICD-10-CM

## 2023-07-09 DIAGNOSIS — R12 Heartburn: Secondary | ICD-10-CM

## 2023-07-09 MED ORDER — ALUM & MAG HYDROXIDE-SIMETH 200-200-20 MG/5ML PO SUSP
15.0000 mL | Freq: Once | ORAL | Status: AC
  Administered 2023-07-09: 15 mL via ORAL
  Filled 2023-07-09: qty 30

## 2023-07-09 MED ORDER — FAMOTIDINE 20 MG PO TABS: 10.0000 mg | ORAL_TABLET | Freq: Once | ORAL | Status: DC

## 2023-07-09 MED ORDER — SODIUM CHLORIDE 0.9% IV SOLUTION
250.0000 mL | INTRAVENOUS | Status: DC
  Administered 2023-07-09: 250 mL via INTRAVENOUS
  Filled 2023-07-09: qty 250

## 2023-07-09 NOTE — Telephone Encounter (Signed)
 I tried calling the patient to get her scheduled for a permcath insertion with Dr. Prescilla Brod. I was unable to make contact so I called the patient's PCP and was given the number and name of her Nephrologist. I called the nephrologist office and and spoke with the receptionist /nurse and she was given the pre-procedure instructions for the patient and stated she would continue to call today to make sure she received the information.

## 2023-07-09 NOTE — Telephone Encounter (Signed)
 Patient called in stating that someone had called to make an appt for tomorrow for her. I advised verbatim the note that Rice Chamorro left in patient's chart. Patient hung up.

## 2023-07-10 ENCOUNTER — Encounter: Payer: Self-pay | Admitting: Registered Nurse

## 2023-07-10 ENCOUNTER — Other Ambulatory Visit: Payer: Self-pay

## 2023-07-10 ENCOUNTER — Encounter
Admission: RE | Disposition: A | Discharge status: Home / Self Care | Payer: Self-pay | Source: Home / Self Care | Attending: Vascular Surgery

## 2023-07-10 ENCOUNTER — Ambulatory Visit
Admission: RE | Admit: 2023-07-10 | Discharge: 2023-07-10 | Disposition: A | Discharge status: Home / Self Care | Attending: Vascular Surgery | Admitting: Vascular Surgery

## 2023-07-10 DIAGNOSIS — Z79899 Other long term (current) drug therapy: Secondary | ICD-10-CM | POA: Diagnosis not present

## 2023-07-10 DIAGNOSIS — N185 Chronic kidney disease, stage 5: Secondary | ICD-10-CM

## 2023-07-10 DIAGNOSIS — N186 End stage renal disease: Secondary | ICD-10-CM

## 2023-07-10 DIAGNOSIS — D649 Anemia, unspecified: Secondary | ICD-10-CM | POA: Diagnosis not present

## 2023-07-10 DIAGNOSIS — J45909 Unspecified asthma, uncomplicated: Secondary | ICD-10-CM | POA: Diagnosis not present

## 2023-07-10 DIAGNOSIS — F1090 Alcohol use, unspecified, uncomplicated: Secondary | ICD-10-CM

## 2023-07-10 DIAGNOSIS — I120 Hypertensive chronic kidney disease with stage 5 chronic kidney disease or end stage renal disease: Secondary | ICD-10-CM | POA: Diagnosis not present

## 2023-07-10 DIAGNOSIS — Z992 Dependence on renal dialysis: Secondary | ICD-10-CM | POA: Diagnosis not present

## 2023-07-10 HISTORY — PX: DIALYSIS/PERMA CATHETER INSERTION: CATH118288

## 2023-07-10 LAB — BPAM RBC
Blood Product Expiration Date: 202506242359
Blood Product Expiration Date: 202506202359
Blood Product Expiration Date: 202506242359
Blood Product Unit Number: 202506242359
ISSUE DATE / TIME: 202505260757
ISSUE DATE / TIME: 202506050837
ISSUE DATE / TIME: 202506050921
PRODUCT CODE: 202506051019
PRODUCT CODE: 202506242359
Unit Type and Rh: 5100
Unit Type and Rh: 202506242359
Unit Type and Rh: 5100
Unit Type and Rh: 5100
Unit Type and Rh: 9500
Unit Type and Rh: 9500

## 2023-07-10 LAB — TYPE AND SCREEN
ABO/RH(D): O NEG
Antibody Screen: NEGATIVE
Unit division: 0
Unit division: 0
Unit division: 0
Unit division: 0

## 2023-07-10 LAB — POTASSIUM (ARMC VASCULAR LAB ONLY): Potassium (ARMC vascular lab): 3.5 mmol/L (ref 3.5–5.1)

## 2023-07-10 SURGERY — DIALYSIS/PERMA CATHETER INSERTION
Anesthesia: Moderate Sedation

## 2023-07-10 MED ORDER — ONDANSETRON HCL 4 MG/2ML IJ SOLN: 4.0000 mg | Freq: Four times a day (QID) | INTRAMUSCULAR | Status: DC | PRN

## 2023-07-10 MED ORDER — HYDROMORPHONE HCL 1 MG/ML IJ SOLN: 1.0000 mg | Freq: Once | INTRAMUSCULAR | Status: DC | PRN

## 2023-07-10 MED ORDER — IODIXANOL 320 MG/ML IV SOLN
INTRAVENOUS | Status: DC | PRN
  Administered 2023-07-10: 15 mL

## 2023-07-10 MED ORDER — FAMOTIDINE 20 MG PO TABS: 40.0000 mg | ORAL_TABLET | Freq: Once | ORAL | Status: DC | PRN

## 2023-07-10 MED ORDER — CEFAZOLIN SODIUM-DEXTROSE 1-4 GM/50ML-% IV SOLN
INTRAVENOUS | Status: AC
  Filled 2023-07-10: qty 50

## 2023-07-10 MED ORDER — FENTANYL CITRATE (PF) 100 MCG/2ML IJ SOLN
INTRAMUSCULAR | Status: AC
  Filled 2023-07-10: qty 2

## 2023-07-10 MED ORDER — MIDAZOLAM HCL 5 MG/5ML IJ SOLN
INTRAMUSCULAR | Status: AC
  Filled 2023-07-10: qty 5

## 2023-07-10 MED ORDER — DIPHENHYDRAMINE HCL 50 MG/ML IJ SOLN: 50.0000 mg | Freq: Once | INTRAMUSCULAR | Status: DC | PRN

## 2023-07-10 MED ORDER — HEPARIN SODIUM (PORCINE) 10000 UNIT/ML IJ SOLN
INTRAMUSCULAR | Status: DC | PRN
  Administered 2023-07-10: 10000 [IU]

## 2023-07-10 MED ORDER — HEPARIN (PORCINE) IN NACL 1000-0.9 UT/500ML-% IV SOLN
INTRAVENOUS | Status: DC | PRN
  Administered 2023-07-10: 500 mL

## 2023-07-10 MED ORDER — CEFAZOLIN SODIUM-DEXTROSE 1-4 GM/50ML-% IV SOLN
1.0000 g | INTRAVENOUS | Status: AC
Start: 2023-07-10 — End: 2023-07-10
  Administered 2023-07-10: 1 g via INTRAVENOUS

## 2023-07-10 MED ORDER — MIDAZOLAM HCL 2 MG/2ML IJ SOLN
INTRAMUSCULAR | Status: DC | PRN
  Administered 2023-07-10: 2 mg via INTRAVENOUS
  Administered 2023-07-10: 1 mg via INTRAVENOUS
  Administered 2023-07-10: .5 mg via INTRAVENOUS

## 2023-07-10 MED ORDER — HEPARIN SODIUM (PORCINE) 10000 UNIT/ML IJ SOLN
INTRAMUSCULAR | Status: AC
  Filled 2023-07-10: qty 1

## 2023-07-10 MED ORDER — METHYLPREDNISOLONE SODIUM SUCC 125 MG IJ SOLR: 125.0000 mg | Freq: Once | INTRAMUSCULAR | Status: DC | PRN

## 2023-07-10 MED ORDER — SODIUM CHLORIDE 0.9 % IV SOLN: INTRAVENOUS | Status: DC

## 2023-07-10 MED ORDER — LIDOCAINE-EPINEPHRINE (PF) 1 %-1:200000 IJ SOLN
INTRAMUSCULAR | Status: DC | PRN
Start: 2023-07-10 — End: 2023-07-10
  Administered 2023-07-10: 10 mL

## 2023-07-10 MED ORDER — FENTANYL CITRATE (PF) 100 MCG/2ML IJ SOLN
INTRAMUSCULAR | Status: DC | PRN
  Administered 2023-07-10: 50 ug via INTRAVENOUS
  Administered 2023-07-10: 25 ug via INTRAVENOUS
  Administered 2023-07-10: 12.5 ug via INTRAVENOUS

## 2023-07-10 MED ORDER — MIDAZOLAM HCL 2 MG/ML PO SYRP: 8.0000 mg | ORAL_SOLUTION | Freq: Once | ORAL | Status: DC | PRN

## 2023-07-10 SURGICAL SUPPLY — 18 items
BIOPATCH RED 1 DISK 7.0 (GAUZE/BANDAGES/DRESSINGS) IMPLANT
CATH BEACON 5 .035 40 KMP TP (CATHETERS) IMPLANT
CATH BEACON 5.038 65CM KMP-01 (CATHETERS) IMPLANT
CATH CANNON HEMO 15FR 19 (HEMODIALYSIS SUPPLIES) IMPLANT
COVER PROBE ULTRASOUND 5X96 (MISCELLANEOUS) IMPLANT
DERMABOND ADVANCED .7 DNX12 (GAUZE/BANDAGES/DRESSINGS) IMPLANT
DRAPE FEMORAL ANGIO W/ POUCH (DRAPES) IMPLANT
DRAPE INCISE IOBAN 66X45 STRL (DRAPES) IMPLANT
GOWN STRL REUS W/ TWL LRG LVL3 (GOWN DISPOSABLE) ×1 IMPLANT
GUIDEWIRE AMPLATZ SHORT (WIRE) IMPLANT
GUIDEWIRE SUPER STIFF .035X180 (WIRE) IMPLANT
INTRODUCER 7FR 23CM (INTRODUCER) IMPLANT
NDL ENTRY 21GA 7CM ECHOTIP (NEEDLE) IMPLANT
NEEDLE ENTRY 21GA 7CM ECHOTIP (NEEDLE) ×1 IMPLANT
PACK ANGIOGRAPHY (CUSTOM PROCEDURE TRAY) ×1 IMPLANT
SET INTRO CAPELLA COAXIAL (SET/KITS/TRAYS/PACK) IMPLANT
SUT MNCRL AB 4-0 PS2 18 (SUTURE) IMPLANT
SUT SILK 0 FSL (SUTURE) IMPLANT

## 2023-07-10 NOTE — Op Note (Signed)
 [facility]   OPERATIVE NOTE     PROCEDURE: 1. Insertion of a right IJ tunneled dialysis catheter. 2. Catheter placement and cannulation under ultrasound and fluoroscopic guidance. 3.  Superior venacavogram  PRE-OPERATIVE DIAGNOSIS: end-stage renal requiring hemodialysis  POST-OPERATIVE DIAGNOSIS: same as above  SURGEON: Devon Fogo  ANESTHESIA: Conscious sedation was administered under my direct supervision by the interventional radiology RN. IV Versed plus fentanyl were utilized. Continuous ECG, pulse oximetry and blood pressure was monitored throughout the entire procedure.  Conscious sedation was for a total of 1 hour 17 minutes and 17 seconds.  ESTIMATED BLOOD LOSS: Minimal  FINDING(S): 1.  Tips of the catheter in the right atrium on fluoroscopy 2.  No obvious pneumothorax on fluoroscopy  SPECIMEN(S):  none  INDICATIONS:   Megan Lambert is a 53 y.o. female  presents with end stage renal disease.  Therefore, the patient requires a tunneled dialysis catheter placement.  The patient is informed of  the risks catheter placement include but are not limited to: bleeding, infection, central venous injury, pneumothorax, possible venous stenosis, possible malpositioning in the venous system, and possible infections related to long-term catheter presence.  The patient was aware of these risks and agreed to proceed.  DESCRIPTION: The patient was taken back to Special Procedure suite.  Prior to sedation, the patient was given IV antibiotics.  After obtaining adequate sedation, the patient was prepped and draped in the standard fashion for a right IJ tunneled dialysis catheter placement.  Appropriate Time Out is called.     The right neck and chest wall are then infiltrated with 1% Lidocaine with epinepherine.  A 19 cm tip to cuff catheter is then selected, opened on the back table and prepped. Ultrasound is placed in a sterile sleeve.  Under ultrasound  guidance, the right IJ vein is examined and is noted to be echolucent and easily compressible indicating patency.   An image is recorded for the permanent record.  The right IJ vein is cannulated with the microneedle under direct ultrasound vissualization.  A Microwire followed by a micro sheath is then inserted but under fluoroscopic guidance it appeared to have an abnormal course.  I believe the wire was entering into the coronary sinus and then tracking along the coronary veins.  I was able to pull the wire back and negotiated into the superior vena cava.   A J-wire was then advanced under fluoroscopic guidance into the inferior vena cava and the wire was secured.  Small counter incision was then made at the wire insertion site. A small pocket was fashioned with blunt dissection to allow easier passage of the cuff.  The dilator and peel-away sheath are then advanced over the wire under fluoroscopic guidance.  This to had a unusual course, kind of veering to the midline.  With the peel-away sheath at the level of the clavicular head I advanced a 7 French 23 cm Pinnacle sheath through the peel-away sheath the catheters and advanced through the peel-away sheath.  2 separate hand injections of contrast were then performed they demonstrated the innominate and inferior vena cava which were slightly deviated to the right lateral side.  No evidence of extravasation or hemodynamically significant stricture was noted.  Ultimately the J-wire was not providing enough support and a 40 Kumpe catheter was advanced over the wire into the inferior vena cava and an Amplatz Super Stiff wire was advanced under fluoroscopic guidance through the Kumpe.  Kumpe catheter was removed the dilator  was passed over the wire and the dilator peel-away sheath were then advanced without further difficulty.  The catheter is then advanced through the peel-away sheath.  It is approximated to the chest wall after verifying the tips at the atrial  caval junction and an exit site is selected.  Small incision is made at the selected exit site and the tunneling device was passed subcutaneously to the counter incision. Catheter is then pulled through the subcutaneous tunnel. The catheter is then verified for tip position under fluoroscopy, transected and the hub assembly connected.    Each port was tested by aspirating and flushing.  No resistance was noted.  Each port was then thoroughly flushed with heparinized saline.  The catheter was secured in placed with two interrupted stitches of 0 silk tied to the catheter.  The counter incision was closed with a U-stitch of 4-0 Monocryl.  The insertion site is then cleaned and sterile bandages applied including a Biopatch.  Each port was then packed with concentrated heparin (1000 Units/mL) at the manufacturer recommended volumes to each port.  Sterile caps were applied to each port.  On completion fluoroscopy, the tips of the catheter were in the right atrium, and there was no evidence of pneumothorax.  COMPLICATIONS: None  CONDITION: Unchanged   Devon Fogo [facility] Office: [phone]   07/10/2023, 4:24 PM

## 2023-07-10 NOTE — Progress Notes (Signed)
 MRN : [id number]  Megan Lambert is a 53 y.o. (1970/02/11) female who presents with chief complaint of check access.  History of Present Illness:   The patient is seen for evaluation for dialysis access.  She is followed by [facility] nephrology  She was recently seen in the oncology clinic on June 02, 2023 at which time she was transferred to the emergency department for acute worsening of her serum creatinine and advancing uremic symptoms.  At that time plans were to place a temporary catheter and initiate dialysis however she left AMA.  The patient has chronic renal insufficiency stage V secondary to hypertension. The patient's most recent creatinine clearance is less than 5, labs dated Jul 02, 2023. The patient volume status has not yet become an issue. Patient's blood pressures been relatively well controlled. There are severe uremic symptoms which appear to be poorly tolerated at this time.  The patient notes the kidney problem has been present for a long time and has been progressively getting worse.  The patient has been considering the various methods of dialysis and wishes to proceed with hemodialysis and therefore tunneled catheter is being placed.  No recent shortening of the patient's walking distance or new symptoms consistent with claudication.  No history of rest pain symptoms. No new ulcers or wounds of the lower extremities have occurred.  The patient denies amaurosis fugax or recent TIA symptoms. There are no recent neurological changes noted. There is no history of DVT, PE or superficial thrombophlebitis. No recent episodes of angina or shortness of breath documented.   No outpatient medications have been marked as taking for the 07/10/23 encounter [facility] Encounter).    Past Medical History:  Diagnosis Date   Anemia    Asthma    Hypertension     Past Surgical History:  Procedure Laterality Date   TUBAL LIGATION      Social  History Social History   Tobacco Use   Smoking status: Never    Passive exposure: Never   Smokeless tobacco: Never  Vaping Use   Vaping status: Never Used  Substance Use Topics   Alcohol use: Yes    Comment: a few shots a day.   Drug use: No    Family History Family History  Problem Relation Age of Onset   Anemia Mother     Allergies  Allergen Reactions   Lisinopril Swelling     REVIEW OF SYSTEMS (Negative unless checked)  Constitutional: [] Weight loss  [] Fever  [] Chills Cardiac: [] Chest pain   [] Chest pressure   [] Palpitations   [] Shortness of breath when laying flat   [] Shortness of breath with exertion. Vascular:  [] Pain in legs with walking   [] Pain in legs at rest  [] History of DVT   [] Phlebitis   [] Swelling in legs   [] Varicose veins   [] Non-healing ulcers Pulmonary:   [] Uses home oxygen   [] Productive cough   [] Hemoptysis   [] Wheeze  [] COPD   [x] Asthma Neurologic:  [] Dizziness   [] Seizures   [] History of stroke   [] History of TIA  [] Aphasia   [] Vissual changes   [] Weakness or numbness in arm   [] Weakness or numbness in leg Musculoskeletal:   [] Joint swelling   [] Joint pain   [] Low back pain Hematologic:  [] Easy bruising  [] Easy bleeding   [] Hypercoagulable state   [x] Anemic Gastrointestinal:  [] Diarrhea   [] Vomiting  [] Gastroesophageal  reflux/heartburn   [] Difficulty swallowing. Genitourinary:  [x] Chronic kidney disease   [] Difficult urination  [] Frequent urination   [] Blood in urine Skin:  [] Rashes   [] Ulcers  Psychological:  [] History of anxiety   []  History of major depression.  Physical Examination  There were no vitals filed for this visit. There is no height or weight on file to calculate BMI. Gen: WD/WN, NAD Head: /AT, No temporalis wasting.  Ear/Nose/Throat: Hearing grossly intact, nares w/o erythema or drainage Eyes: PER, EOMI, sclera nonicteric.  Neck: Supple, no gross masses or lesions.  No JVD.  Pulmonary:  Good air movement, no audible wheezing,  no use of accessory muscles.  Cardiac: RRR, precordium non-hyperdynamic. Vascular:   Neck and chest wall are clean dry and intact Vessel Right Left  Radial Palpable Palpable  Brachial Palpable Palpable  Gastrointestinal: soft, non-distended. No guarding/no peritoneal signs.  Musculoskeletal: M/S 5/5 throughout.  No deformity.  Neurologic: CN 2-12 intact. Pain and light touch intact in extremities.  Symmetrical.  Speech is fluent. Motor exam as listed above. Psychiatric: Judgment intact, Mood & affect appropriate for pt's clinical situation. Dermatologic: No rashes or ulcers noted.  No changes consistent with cellulitis.   CBC Lab Results  Component Value Date   WBC 4.2 07/08/2023   HGB 6.0 (LL) 07/08/2023   HCT 17.8 (L) 07/08/2023   MCV 82.0 07/08/2023   PLT 150 07/08/2023    BMET    Component Value Date/Time   NA 130 (L) 06/26/2023 1356   NA 135 (L) 04/25/2013 0937   K 3.8 06/26/2023 1356   K 4.0 04/25/2013 0937   CL 96 (L) 06/26/2023 1356   CL 101 04/25/2013 0937   CO2 17 (L) 06/26/2023 1356   CO2 29 04/25/2013 0937   GLUCOSE 91 06/26/2023 1356   GLUCOSE 93 04/25/2013 0937   BUN 71 (H) 06/26/2023 1356   BUN 8 04/25/2013 0937   CREATININE 8.56 (H) 06/26/2023 1356   CREATININE 6.21 (H) 11/18/2022 0846   CREATININE 1.05 04/25/2013 0937   CALCIUM  8.9 06/26/2023 1356   CALCIUM  8.4 (L) 04/25/2013 0937   GFRNONAA 5 (L) 06/26/2023 1356   GFRNONAA 7 (L) 11/18/2022 0846   GFRNONAA >60 04/25/2013 0937   GFRAA 46 (L) 03/30/2018 0411   GFRAA >60 04/25/2013 0937   Estimated Creatinine Clearance: 8.7 mL/min (A) (by C-G formula based on SCr of 8.56 mg/dL (H)).  COAG No results found for: "INR", "PROTIME"  Radiology No results found.   Assessment/Plan Chronic renal insufficiency stage V: Recommend:  At this time the patient does not have appropriate dialysis access for dialysis.  She has progressed and is now requiring hemodialysis secondary to advancing uremia and poor  control of her volume status.   Patient should have a tunneled catheter placed.  The risks, benefits and alternative therapies were reviewed in detail with the patient.  All questions were answered.  The patient agrees to proceed with surgery.   The patient will follow up with me in the office after the surgery.  2.  Hypertension: Continue antihypertensive medications as already ordered, these medications have been reviewed and there are no changes at this time.  3.  Asthma: Continue pulmonary medications and aerosols as already ordered, these medications have been reviewed and there are no changes at this time.  4.  Anemia: Patient is currently followed by hematology.  She is now initiating dialysis.  Plans for iron  transfusions and Epogen per the hematology and nephrology service   Devon Fogo, MD  07/10/2023 12:14 PM

## 2023-07-10 NOTE — H&P (View-Only) (Signed)
 MRN : [id number]  Megan Lambert is a 53 y.o. (1970/02/11) female who presents with chief complaint of check access.  History of Present Illness:   The patient is seen for evaluation for dialysis access.  She is followed by [facility] nephrology  She was recently seen in the oncology clinic on June 02, 2023 at which time she was transferred to the emergency department for acute worsening of her serum creatinine and advancing uremic symptoms.  At that time plans were to place a temporary catheter and initiate dialysis however she left AMA.  The patient has chronic renal insufficiency stage V secondary to hypertension. The patient's most recent creatinine clearance is less than 5, labs dated Jul 02, 2023. The patient volume status has not yet become an issue. Patient's blood pressures been relatively well controlled. There are severe uremic symptoms which appear to be poorly tolerated at this time.  The patient notes the kidney problem has been present for a long time and has been progressively getting worse.  The patient has been considering the various methods of dialysis and wishes to proceed with hemodialysis and therefore tunneled catheter is being placed.  No recent shortening of the patient's walking distance or new symptoms consistent with claudication.  No history of rest pain symptoms. No new ulcers or wounds of the lower extremities have occurred.  The patient denies amaurosis fugax or recent TIA symptoms. There are no recent neurological changes noted. There is no history of DVT, PE or superficial thrombophlebitis. No recent episodes of angina or shortness of breath documented.   No outpatient medications have been marked as taking for the 07/10/23 encounter [facility] Encounter).    Past Medical History:  Diagnosis Date   Anemia    Asthma    Hypertension     Past Surgical History:  Procedure Laterality Date   TUBAL LIGATION      Social  History Social History   Tobacco Use   Smoking status: Never    Passive exposure: Never   Smokeless tobacco: Never  Vaping Use   Vaping status: Never Used  Substance Use Topics   Alcohol use: Yes    Comment: a few shots a day.   Drug use: No    Family History Family History  Problem Relation Age of Onset   Anemia Mother     Allergies  Allergen Reactions   Lisinopril Swelling     REVIEW OF SYSTEMS (Negative unless checked)  Constitutional: [] Weight loss  [] Fever  [] Chills Cardiac: [] Chest pain   [] Chest pressure   [] Palpitations   [] Shortness of breath when laying flat   [] Shortness of breath with exertion. Vascular:  [] Pain in legs with walking   [] Pain in legs at rest  [] History of DVT   [] Phlebitis   [] Swelling in legs   [] Varicose veins   [] Non-healing ulcers Pulmonary:   [] Uses home oxygen   [] Productive cough   [] Hemoptysis   [] Wheeze  [] COPD   [x] Asthma Neurologic:  [] Dizziness   [] Seizures   [] History of stroke   [] History of TIA  [] Aphasia   [] Vissual changes   [] Weakness or numbness in arm   [] Weakness or numbness in leg Musculoskeletal:   [] Joint swelling   [] Joint pain   [] Low back pain Hematologic:  [] Easy bruising  [] Easy bleeding   [] Hypercoagulable state   [x] Anemic Gastrointestinal:  [] Diarrhea   [] Vomiting  [] Gastroesophageal  reflux/heartburn   [] Difficulty swallowing. Genitourinary:  [x] Chronic kidney disease   [] Difficult urination  [] Frequent urination   [] Blood in urine Skin:  [] Rashes   [] Ulcers  Psychological:  [] History of anxiety   []  History of major depression.  Physical Examination  There were no vitals filed for this visit. There is no height or weight on file to calculate BMI. Gen: WD/WN, NAD Head: /AT, No temporalis wasting.  Ear/Nose/Throat: Hearing grossly intact, nares w/o erythema or drainage Eyes: PER, EOMI, sclera nonicteric.  Neck: Supple, no gross masses or lesions.  No JVD.  Pulmonary:  Good air movement, no audible wheezing,  no use of accessory muscles.  Cardiac: RRR, precordium non-hyperdynamic. Vascular:   Neck and chest wall are clean dry and intact Vessel Right Left  Radial Palpable Palpable  Brachial Palpable Palpable  Gastrointestinal: soft, non-distended. No guarding/no peritoneal signs.  Musculoskeletal: M/S 5/5 throughout.  No deformity.  Neurologic: CN 2-12 intact. Pain and light touch intact in extremities.  Symmetrical.  Speech is fluent. Motor exam as listed above. Psychiatric: Judgment intact, Mood & affect appropriate for pt's clinical situation. Dermatologic: No rashes or ulcers noted.  No changes consistent with cellulitis.   CBC Lab Results  Component Value Date   WBC 4.2 07/08/2023   HGB 6.0 (LL) 07/08/2023   HCT 17.8 (L) 07/08/2023   MCV 82.0 07/08/2023   PLT 150 07/08/2023    BMET    Component Value Date/Time   NA 130 (L) 06/26/2023 1356   NA 135 (L) 04/25/2013 0937   K 3.8 06/26/2023 1356   K 4.0 04/25/2013 0937   CL 96 (L) 06/26/2023 1356   CL 101 04/25/2013 0937   CO2 17 (L) 06/26/2023 1356   CO2 29 04/25/2013 0937   GLUCOSE 91 06/26/2023 1356   GLUCOSE 93 04/25/2013 0937   BUN 71 (H) 06/26/2023 1356   BUN 8 04/25/2013 0937   CREATININE 8.56 (H) 06/26/2023 1356   CREATININE 6.21 (H) 11/18/2022 0846   CREATININE 1.05 04/25/2013 0937   CALCIUM  8.9 06/26/2023 1356   CALCIUM  8.4 (L) 04/25/2013 0937   GFRNONAA 5 (L) 06/26/2023 1356   GFRNONAA 7 (L) 11/18/2022 0846   GFRNONAA >60 04/25/2013 0937   GFRAA 46 (L) 03/30/2018 0411   GFRAA >60 04/25/2013 0937   Estimated Creatinine Clearance: 8.7 mL/min (A) (by C-G formula based on SCr of 8.56 mg/dL (H)).  COAG No results found for: "INR", "PROTIME"  Radiology No results found.   Assessment/Plan Chronic renal insufficiency stage V: Recommend:  At this time the patient does not have appropriate dialysis access for dialysis.  She has progressed and is now requiring hemodialysis secondary to advancing uremia and poor  control of her volume status.   Patient should have a tunneled catheter placed.  The risks, benefits and alternative therapies were reviewed in detail with the patient.  All questions were answered.  The patient agrees to proceed with surgery.   The patient will follow up with me in the office after the surgery.  2.  Hypertension: Continue antihypertensive medications as already ordered, these medications have been reviewed and there are no changes at this time.  3.  Asthma: Continue pulmonary medications and aerosols as already ordered, these medications have been reviewed and there are no changes at this time.  4.  Anemia: Patient is currently followed by hematology.  She is now initiating dialysis.  Plans for iron  transfusions and Epogen per the hematology and nephrology service   Devon Fogo, MD  07/10/2023 12:14 PM

## 2023-07-10 NOTE — Interval H&P Note (Signed)
 History and Physical Interval Note:  07/10/2023 12:24 PM  Megan Lambert  has presented today for surgery, with the diagnosis of Perma Cath Insertion   End Stage Renal.  The various methods of treatment have been discussed with the patient and family. After consideration of risks, benefits and other options for treatment, the patient has consented to  Procedure(s): DIALYSIS/PERMA CATHETER INSERTION (N/A) as a surgical intervention.  The patient's history has been reviewed, patient examined, no change in status, stable for surgery.  I have reviewed the patient's chart and labs.  Questions were answered to the patient's satisfaction.     Devon Fogo

## 2023-07-13 ENCOUNTER — Encounter: Payer: Self-pay | Admitting: Vascular Surgery

## 2023-07-20 DIAGNOSIS — D252 Subserosal leiomyoma of uterus: Secondary | ICD-10-CM | POA: Insufficient documentation

## 2023-07-20 DIAGNOSIS — N2581 Secondary hyperparathyroidism of renal origin: Secondary | ICD-10-CM | POA: Insufficient documentation

## 2023-07-20 DIAGNOSIS — E8779 Other fluid overload: Secondary | ICD-10-CM | POA: Insufficient documentation

## 2023-07-20 DIAGNOSIS — N186 End stage renal disease: Secondary | ICD-10-CM | POA: Insufficient documentation

## 2023-07-20 DIAGNOSIS — D509 Iron deficiency anemia, unspecified: Secondary | ICD-10-CM | POA: Insufficient documentation

## 2023-07-20 DIAGNOSIS — D689 Coagulation defect, unspecified: Secondary | ICD-10-CM | POA: Insufficient documentation

## 2023-07-20 DIAGNOSIS — R52 Pain, unspecified: Secondary | ICD-10-CM | POA: Insufficient documentation

## 2023-07-20 DIAGNOSIS — Z87898 Personal history of other specified conditions: Secondary | ICD-10-CM | POA: Insufficient documentation

## 2023-07-20 DIAGNOSIS — L299 Pruritus, unspecified: Secondary | ICD-10-CM | POA: Insufficient documentation

## 2023-07-20 DIAGNOSIS — K753 Granulomatous hepatitis, not elsewhere classified: Secondary | ICD-10-CM | POA: Insufficient documentation

## 2023-07-20 DIAGNOSIS — R197 Diarrhea, unspecified: Secondary | ICD-10-CM | POA: Insufficient documentation

## 2023-07-20 DIAGNOSIS — R509 Fever, unspecified: Secondary | ICD-10-CM | POA: Insufficient documentation

## 2023-07-20 DIAGNOSIS — Z23 Encounter for immunization: Secondary | ICD-10-CM | POA: Insufficient documentation

## 2023-07-20 DIAGNOSIS — E209 Hypoparathyroidism, unspecified: Secondary | ICD-10-CM | POA: Insufficient documentation

## 2023-07-20 DIAGNOSIS — R519 Headache, unspecified: Secondary | ICD-10-CM | POA: Insufficient documentation

## 2023-07-28 ENCOUNTER — Inpatient Hospital Stay

## 2023-07-28 ENCOUNTER — Inpatient Hospital Stay (HOSPITAL_BASED_OUTPATIENT_CLINIC_OR_DEPARTMENT_OTHER): Admitting: Nurse Practitioner

## 2023-07-28 DIAGNOSIS — D631 Anemia in chronic kidney disease: Secondary | ICD-10-CM

## 2023-07-28 DIAGNOSIS — N186 End stage renal disease: Secondary | ICD-10-CM

## 2023-07-28 DIAGNOSIS — Z992 Dependence on renal dialysis: Secondary | ICD-10-CM

## 2023-07-28 DIAGNOSIS — I120 Hypertensive chronic kidney disease with stage 5 chronic kidney disease or end stage renal disease: Secondary | ICD-10-CM | POA: Diagnosis not present

## 2023-07-28 LAB — SAMPLE TO BLOOD BANK

## 2023-07-28 LAB — CBC (CANCER CENTER ONLY)
HCT: 20.7 % — ABNORMAL LOW (ref 36.0–46.0)
Hemoglobin: 7.1 g/dL — ABNORMAL LOW (ref 12.0–15.0)
MCH: 28.3 pg (ref 26.0–34.0)
MCHC: 34.3 g/dL (ref 30.0–36.0)
MCV: 82.5 fL (ref 80.0–100.0)
Platelet Count: 125 10*3/uL — ABNORMAL LOW (ref 150–400)
RBC: 2.51 MIL/uL — ABNORMAL LOW (ref 3.87–5.11)
RDW: 15.9 % — ABNORMAL HIGH (ref 11.5–15.5)
WBC Count: 5.4 10*3/uL (ref 4.0–10.5)
nRBC: 0 % (ref 0.0–0.2)

## 2023-07-28 NOTE — Progress Notes (Signed)
 Virtual Visit Progress Note  Symptom Management Clinic  [facility] Department of the [place]. [facility] [address], [address] [place], [place] [address] [phone] (phone) [phone] (fax)  I connected with Megan Lambert on 07/28/23 at 10:00 AM EDT by telephone visit and verified that I am speaking with the correct person using two identifiers.   I discussed the limitations, risks, security and privacy concerns of performing an evaluation and management service by telemedicine and the availability of in-person appointments. I also discussed with the patient that there may be a patient responsible charge related to this service. The patient expressed understanding and agreed to proceed.   Other persons participating in the visit and their role in the encounter: none   Patient's location: home  Provider's location: clinic   Chief Complaint: CKD of stage V kidney disease    Patient Care Team: [facility] as PCP - General Rennie Cindy SAUNDERS, MD as Consulting Physician (Oncology) Aundria Ladell POUR, MD as Consulting Physician (Gastroenterology)   Name of the patient: Megan Lambert  [id number]  [birth year]   Date of visit: 07/28/23  Heme/Onc History:  Oncology History   No history exists.    Interval history- Megan Lambert is a 53 y.o. female with history of stage V CKD who agrees to telephone visit for follow up of anemia due to chronic kidney disease. In interim, she has started hemodialysis on [facility]. She is now receiving EPO, transfusions, and iron  infusions at dialysis. She denies complaints.   Review of systems- Review of Systems  Constitutional:  Positive for diaphoresis. Negative for fever.  Respiratory:  Negative for shortness of breath.   Cardiovascular:  Negative for chest pain.    Allergies  Allergen Reactions   Lisinopril Swelling   Past Medical History:  Diagnosis Date   Anemia     Asthma    Hypertension    Past Surgical History:  Procedure Laterality Date   DIALYSIS/PERMA CATHETER INSERTION N/A 07/10/2023   Procedure: DIALYSIS/PERMA CATHETER INSERTION;  Surgeon: Jama Cordella MATSU, MD;  Location: [facility] INVASIVE CV LAB;  Service: Cardiovascular;  Laterality: N/A;   TUBAL LIGATION     Current Outpatient Medications:    amLODipine  (NORVASC ) 5 MG tablet, Take 1 tablet by mouth daily., Disp: , Rfl:    Darbepoetin Alfa  (ARANESP ) 200 MCG/0.4ML SOSY injection, Inject 0.4 mLs (200 mcg total) into the skin every 28 (twenty-eight) days. Inject 200 mcg aranesp  syringe every 4 weeks . Hold if Hgb greater then 10., Disp: 4.8 mL, Rfl: 0   ferrous sulfate 325 (65 FE) MG EC tablet, Take 325 mg by mouth every other day., Disp: , Rfl:    folic acid  (FOLVITE ) 1 MG tablet, Take 1 mg by mouth daily. (Patient not taking: Reported on 07/10/2023), Disp: , Rfl:    magnesium  oxide (MAG-OX) 400 MG tablet, Take 400 mg by mouth daily., Disp: , Rfl:    potassium chloride  (KLOR-CON ) 10 MEQ tablet, Take 1 tablet by mouth daily., Disp: , Rfl:    sodium bicarbonate 650 MG tablet, Take 1,950 mg by mouth daily., Disp: , Rfl:   Physical exam: Exam limited due to telemedicine  There were no vitals filed for this visit. Physical Exam Pulmonary:     Effort: No respiratory distress.   Neurological:     Mental Status: She is oriented to person, place, and time.       Latest Ref Rng & Units 07/28/2023  9:45 AM  CBC  WBC 4.0 - 10.5 K/uL 5.4   Hemoglobin 12.0 - 15.0 g/dL 7.1   Hematocrit 63.9 - 46.0 % 20.7   Platelets 150 - 400 K/uL 125    PERIPHERAL VASCULAR CATHETERIZATION Result Date: 07/10/2023 See surgical note for result.  Assessment and plan- Patient is a 53 y.o. female   Anemia of Stage V CKD- ( s/p kidney biopsy [facility] Dr Myna). Severe and progressive. Previously referred for transplant but has not followed up. She has now started on dialysis. Discussed that typically supportive care  of her blood counts is provided by nephrology at her dialysis appointments as opposed to additional appointments at hematology office. She can return to clinic as needed in the future.    Visit Diagnosis 1. Anemia in chronic kidney disease, on chronic dialysis [facility])    Patient expressed understanding and was in agreement with this plan. She also understands that She can call clinic at any time with any questions, concerns, or complaints.   I discussed the assessment and treatment plan with the patient. The patient was provided an opportunity to ask questions and all were answered. The patient agreed with the plan and demonstrated an understanding of the instructions.   The patient was advised to call back or seek an in-person evaluation if the symptoms worsen or if the condition fails to improve as anticipated.  I spent 15 minutes on this telephone encounter.   Thank you for allowing me to participate in the care of this patient.   Tinnie Dawn, DNP, AGNP-[facility]

## 2023-07-29 ENCOUNTER — Inpatient Hospital Stay

## 2023-07-29 ENCOUNTER — Telehealth (INDEPENDENT_AMBULATORY_CARE_PROVIDER_SITE_OTHER): Payer: Self-pay

## 2023-07-29 NOTE — Telephone Encounter (Signed)
 I attempted to contact the patient to schedule her for a left brachialcephalic fistula with Dr. Jama. A message was left for a return call.

## 2023-07-31 ENCOUNTER — Telehealth (INDEPENDENT_AMBULATORY_CARE_PROVIDER_SITE_OTHER): Payer: Self-pay

## 2023-07-31 NOTE — Telephone Encounter (Signed)
 Patient returned my call and is now scheduled with Dr. Jama for a left arm radialcephalic AVF on 08/26/23 at the [facility]. Pre-admit will call to schedule pre-op .  Pre-surgical instructions were discussed and will be mailed.

## 2023-08-04 ENCOUNTER — Other Ambulatory Visit (INDEPENDENT_AMBULATORY_CARE_PROVIDER_SITE_OTHER)

## 2023-08-04 ENCOUNTER — Encounter (INDEPENDENT_AMBULATORY_CARE_PROVIDER_SITE_OTHER)

## 2023-08-04 ENCOUNTER — Ambulatory Visit (INDEPENDENT_AMBULATORY_CARE_PROVIDER_SITE_OTHER): Admitting: Nurse Practitioner

## 2023-08-05 ENCOUNTER — Other Ambulatory Visit: Payer: Self-pay | Admitting: *Deleted

## 2023-08-05 ENCOUNTER — Telehealth: Payer: Self-pay | Admitting: *Deleted

## 2023-08-05 DIAGNOSIS — D631 Anemia in chronic kidney disease: Secondary | ICD-10-CM

## 2023-08-05 DIAGNOSIS — D649 Anemia, unspecified: Secondary | ICD-10-CM

## 2023-08-05 NOTE — Telephone Encounter (Signed)
 Dr. Annis Pons ([id number]) called from dialysis regarding mutual patient. Pt's hgb staying at 7. Dialysis is requesting a possible blood transfusion to be set up next week. Dr. Pons requesting a return phone call to confirm this plan.  Dr. KATHEE IVER Maxwell Please advise- regarding possible lab day 1 and blood transfusion next week. I can return the phone call once you are ok with this plan and scheduling as arranged apts.

## 2023-08-05 NOTE — Telephone Encounter (Signed)
 Per scheduling - we can have patient scheduled for lab Wednesday at 9 am and blood thursday at 830. Per Dr. Teresia- pt's dialysis is normally [place] [place] [facility] [place] but they can make arrangements to work the blood transfusion around dialysis. Also pt not successful in getting in with [facility] for colonoscopy due to not having a provider available for scopes. Dr. Teresia ask that we send the referral to [facility]. Dialysis also tried to get [facility], but there is a long wait for the referrals at [facility]. Per request of nephrologist provider - New referral for GI entered to [facility] in [place]. Dr. Teresia will call patient with the above appointments and let her know. Patient is trying to work a job at [employer] and trying to balance all the appointments. Dr. Teresia thanked me for helping her coordinate all of her appointments.

## 2023-08-06 ENCOUNTER — Encounter: Payer: Self-pay | Admitting: Gastroenterology

## 2023-08-06 NOTE — Telephone Encounter (Signed)
 error

## 2023-08-12 ENCOUNTER — Telehealth: Payer: Self-pay | Admitting: *Deleted

## 2023-08-12 ENCOUNTER — Encounter: Payer: Self-pay | Admitting: *Deleted

## 2023-08-12 ENCOUNTER — Other Ambulatory Visit: Payer: Self-pay | Admitting: *Deleted

## 2023-08-12 ENCOUNTER — Inpatient Hospital Stay: Attending: Internal Medicine

## 2023-08-12 DIAGNOSIS — I120 Hypertensive chronic kidney disease with stage 5 chronic kidney disease or end stage renal disease: Secondary | ICD-10-CM | POA: Insufficient documentation

## 2023-08-12 DIAGNOSIS — D649 Anemia, unspecified: Secondary | ICD-10-CM

## 2023-08-12 DIAGNOSIS — D631 Anemia in chronic kidney disease: Secondary | ICD-10-CM | POA: Insufficient documentation

## 2023-08-12 DIAGNOSIS — N186 End stage renal disease: Secondary | ICD-10-CM

## 2023-08-12 DIAGNOSIS — N185 Chronic kidney disease, stage 5: Secondary | ICD-10-CM | POA: Insufficient documentation

## 2023-08-12 LAB — CBC WITH DIFFERENTIAL (CANCER CENTER ONLY)
Abs Immature Granulocytes: 0.03 K/uL (ref 0.00–0.07)
Basophils Absolute: 0 K/uL (ref 0.0–0.1)
Basophils Relative: 1 %
Eosinophils Absolute: 0.1 K/uL (ref 0.0–0.5)
Eosinophils Relative: 2 %
HCT: 18.9 % — ABNORMAL LOW (ref 36.0–46.0)
Hemoglobin: 6.2 g/dL — CL (ref 12.0–15.0)
Immature Granulocytes: 1 %
Lymphocytes Relative: 26 %
Lymphs Abs: 1.2 K/uL (ref 0.7–4.0)
MCH: 28.6 pg (ref 26.0–34.0)
MCHC: 32.8 g/dL (ref 30.0–36.0)
MCV: 87.1 fL (ref 80.0–100.0)
Monocytes Absolute: 0.5 K/uL (ref 0.1–1.0)
Monocytes Relative: 11 %
Neutro Abs: 2.7 K/uL (ref 1.7–7.7)
Neutrophils Relative %: 59 %
Platelet Count: 177 K/uL (ref 150–400)
RBC: 2.17 MIL/uL — ABNORMAL LOW (ref 3.87–5.11)
RDW: 18.1 % — ABNORMAL HIGH (ref 11.5–15.5)
WBC Count: 4.5 K/uL (ref 4.0–10.5)
nRBC: 0.9 % — ABNORMAL HIGH (ref 0.0–0.2)

## 2023-08-12 LAB — PREPARE RBC (CROSSMATCH)

## 2023-08-12 NOTE — Telephone Encounter (Signed)
 10:00 am Received Critical lab result from [facility]. Hemoglobin is 6.2. Readback. Dr Rennie notified. Transfusion ordered.

## 2023-08-13 ENCOUNTER — Inpatient Hospital Stay

## 2023-08-14 ENCOUNTER — Inpatient Hospital Stay

## 2023-08-14 DIAGNOSIS — I120 Hypertensive chronic kidney disease with stage 5 chronic kidney disease or end stage renal disease: Secondary | ICD-10-CM | POA: Diagnosis not present

## 2023-08-14 DIAGNOSIS — D649 Anemia, unspecified: Secondary | ICD-10-CM

## 2023-08-14 DIAGNOSIS — D631 Anemia in chronic kidney disease: Secondary | ICD-10-CM

## 2023-08-14 MED ORDER — DIPHENHYDRAMINE HCL 25 MG PO CAPS
25.0000 mg | ORAL_CAPSULE | Freq: Once | ORAL | Status: AC
  Administered 2023-08-14: 25 mg via ORAL
  Filled 2023-08-14: qty 1

## 2023-08-14 MED ORDER — SODIUM CHLORIDE 0.9% IV SOLUTION
250.0000 mL | INTRAVENOUS | Status: DC
  Administered 2023-08-14: 100 mL via INTRAVENOUS
  Filled 2023-08-14: qty 250

## 2023-08-14 MED ORDER — ACETAMINOPHEN 325 MG PO TABS
650.0000 mg | ORAL_TABLET | Freq: Once | ORAL | Status: AC
  Administered 2023-08-14: 650 mg via ORAL
  Filled 2023-08-14: qty 2

## 2023-08-14 NOTE — Patient Instructions (Signed)

## 2023-08-15 LAB — BPAM RBC
Blood Product Expiration Date: 202508072359
Blood Product Expiration Date: 202508082359
ISSUE DATE / TIME: 202507110933
ISSUE DATE / TIME: 202507111129
Unit Type and Rh: 5100
Unit Type and Rh: 5100

## 2023-08-15 LAB — TYPE AND SCREEN
ABO/RH(D): O NEG
Antibody Screen: NEGATIVE
Unit division: 0
Unit division: 0

## 2023-08-17 ENCOUNTER — Other Ambulatory Visit: Payer: Self-pay

## 2023-08-17 ENCOUNTER — Encounter
Admission: RE | Admit: 2023-08-17 | Discharge: 2023-08-17 | Disposition: A | Discharge status: Home / Self Care | Source: Ambulatory Visit | Attending: Vascular Surgery | Admitting: Vascular Surgery

## 2023-08-17 ENCOUNTER — Other Ambulatory Visit (INDEPENDENT_AMBULATORY_CARE_PROVIDER_SITE_OTHER): Payer: Self-pay | Admitting: Nurse Practitioner

## 2023-08-17 DIAGNOSIS — N185 Chronic kidney disease, stage 5: Secondary | ICD-10-CM

## 2023-08-17 DIAGNOSIS — E876 Hypokalemia: Secondary | ICD-10-CM

## 2023-08-17 DIAGNOSIS — E871 Hypo-osmolality and hyponatremia: Secondary | ICD-10-CM

## 2023-08-17 DIAGNOSIS — I10 Essential (primary) hypertension: Secondary | ICD-10-CM

## 2023-08-17 DIAGNOSIS — D631 Anemia in chronic kidney disease: Secondary | ICD-10-CM

## 2023-08-17 DIAGNOSIS — Z01812 Encounter for preprocedural laboratory examination: Secondary | ICD-10-CM

## 2023-08-17 HISTORY — DX: Benign neoplasm of connective and other soft tissue, unspecified: D21.9

## 2023-08-17 HISTORY — DX: Cardiac arrhythmia, unspecified: I49.9

## 2023-08-17 HISTORY — DX: Chronic kidney disease, unspecified: N18.9

## 2023-08-17 HISTORY — DX: Pneumonia, unspecified organism: J18.9

## 2023-08-17 NOTE — Patient Instructions (Addendum)
 Your procedure is scheduled on: 08/26/23 - Wednesday Report to the Registration Desk on the 1st floor of the [facility]. To find out your arrival time, please call [phone] between 1PM - 3PM on: 08/25/23 - Tuesday If your arrival time is 6:00 am, do not arrive before that time as the [facility] Mall entrance doors do not open until 6:00 am.  REMEMBER: Instructions that are not followed completely may result in serious medical risk, up to and including death; or upon the discretion of your surgeon and anesthesiologist your surgery may need to be rescheduled.  Do not eat food or drink any liquids after midnight the night before surgery.  No gum chewing or hard candies.  One week prior to surgery: Stop Anti-inflammatories (NSAIDS) such as Advil, Aleve, Ibuprofen, Motrin, Naproxen, Naprosyn and Aspirin based products such as Excedrin, Goody's Powder, BC Powder. You may take Tylenol  if needed for pain up until the day of surgery.  Stop ANY OVER THE COUNTER supplements until after surgery.  Continue taking all of your other prescription medications up until the day of surgery.  ON THE DAY OF SURGERY ONLY TAKE THESE MEDICATIONS WITH SIPS OF WATER:  amLODipine  (NORVASC )  rosuvastatin (CRESTOR)    No Alcohol for 24 hours before or after surgery.  No Smoking including e-cigarettes for 24 hours before surgery.  No chewable tobacco products for at least 6 hours before surgery.  No nicotine patches on the day of surgery.  Do not use any recreational drugs for at least a week (preferably 2 weeks) before your surgery.  Please be advised that the combination of cocaine and anesthesia may have negative outcomes, up to and including death. If you test positive for cocaine, your surgery will be cancelled.  On the morning of surgery brush your teeth with toothpaste and water, you may rinse your mouth with mouthwash if you wish. Do not swallow any toothpaste or mouthwash.  Use CHG Soap or  wipes as directed on instruction sheet.  Do not wear jewelry, make-up, hairpins, clips or nail polish.  For welded (permanent) jewelry: bracelets, anklets, waist bands, etc.  Please have this removed prior to surgery.  If it is not removed, there is a chance that hospital personnel will need to cut it off on the day of surgery.  Do not wear lotions, powders, or perfumes.   Do not shave body hair from the neck down 48 hours before surgery.  Contact lenses, hearing aids and dentures may not be worn into surgery.  Do not bring valuables to the hospital. [facility] is not responsible for any missing/lost belongings or valuables.   Notify your doctor if there is any change in your medical condition (cold, fever, infection).  Wear comfortable clothing (specific to your surgery type) to the hospital.  After surgery, you can help prevent lung complications by doing breathing exercises.  Take deep breaths and cough every 1-2 hours. Your doctor may order a device called an Incentive Spirometer to help you take deep breaths.  When coughing or sneezing, hold a pillow firmly against your incision with both hands. This is called "splinting." Doing this helps protect your incision. It also decreases belly discomfort.  If you are being admitted to the hospital overnight, leave your suitcase in the car. After surgery it may be brought to your room.  In case of increased patient census, it may be necessary for you, the patient, to continue your postoperative care in the Same Day Surgery department.  If  you are being discharged the day of surgery, you will not be allowed to drive home. You will need a responsible individual to drive you home and stay with you for 24 hours after surgery.   If you are taking public transportation, you will need to have a responsible individual with you.  Please call the Pre-admissions Testing Dept. at [phone] if you have any questions about these  instructions.  Surgery Visitation Policy:  Patients having surgery or a procedure may have two visitors.  Children under the age of 62 must have an adult with them who is not the patient.  Inpatient Visitation:    Visiting hours are 7 a.m. to 8 p.m. Up to four visitors are allowed at one time in a patient room. The visitors may rotate out with other people during the day.  One visitor age 10 or older may stay with the patient overnight and must be in the room by 8 p.m.   Merchandiser, retail to address health-related social needs:  [email or web address]

## 2023-08-19 ENCOUNTER — Encounter
Admission: RE | Admit: 2023-08-19 | Discharge: 2023-08-19 | Disposition: A | Discharge status: Home / Self Care | Source: Ambulatory Visit | Attending: Vascular Surgery | Admitting: Vascular Surgery

## 2023-08-19 ENCOUNTER — Other Ambulatory Visit: Payer: Self-pay | Admitting: Internal Medicine

## 2023-08-19 DIAGNOSIS — D631 Anemia in chronic kidney disease: Secondary | ICD-10-CM | POA: Insufficient documentation

## 2023-08-19 DIAGNOSIS — N186 End stage renal disease: Secondary | ICD-10-CM | POA: Insufficient documentation

## 2023-08-19 DIAGNOSIS — E876 Hypokalemia: Secondary | ICD-10-CM | POA: Insufficient documentation

## 2023-08-19 DIAGNOSIS — N185 Chronic kidney disease, stage 5: Secondary | ICD-10-CM

## 2023-08-19 DIAGNOSIS — Z01812 Encounter for preprocedural laboratory examination: Secondary | ICD-10-CM

## 2023-08-19 DIAGNOSIS — K753 Granulomatous hepatitis, not elsewhere classified: Secondary | ICD-10-CM | POA: Insufficient documentation

## 2023-08-19 DIAGNOSIS — Z992 Dependence on renal dialysis: Secondary | ICD-10-CM | POA: Diagnosis not present

## 2023-08-19 DIAGNOSIS — D649 Anemia, unspecified: Secondary | ICD-10-CM

## 2023-08-19 DIAGNOSIS — Z01818 Encounter for other preprocedural examination: Secondary | ICD-10-CM | POA: Insufficient documentation

## 2023-08-19 DIAGNOSIS — I120 Hypertensive chronic kidney disease with stage 5 chronic kidney disease or end stage renal disease: Secondary | ICD-10-CM | POA: Insufficient documentation

## 2023-08-19 DIAGNOSIS — E871 Hypo-osmolality and hyponatremia: Secondary | ICD-10-CM | POA: Insufficient documentation

## 2023-08-19 DIAGNOSIS — I10 Essential (primary) hypertension: Secondary | ICD-10-CM

## 2023-08-19 LAB — CBC
HCT: 24 % — ABNORMAL LOW (ref 36.0–46.0)
Hemoglobin: 8.3 g/dL — ABNORMAL LOW (ref 12.0–15.0)
MCH: 29.6 pg (ref 26.0–34.0)
MCHC: 34.6 g/dL (ref 30.0–36.0)
MCV: 85.7 fL (ref 80.0–100.0)
Platelets: 257 K/uL (ref 150–400)
RBC: 2.8 MIL/uL — ABNORMAL LOW (ref 3.87–5.11)
RDW: 17 % — ABNORMAL HIGH (ref 11.5–15.5)
WBC: 5.8 K/uL (ref 4.0–10.5)
nRBC: 0 % (ref 0.0–0.2)

## 2023-08-19 LAB — BASIC METABOLIC PANEL WITH GFR
Anion gap: 17 — ABNORMAL HIGH (ref 5–15)
BUN: 37 mg/dL — ABNORMAL HIGH (ref 6–20)
CO2: 22 mmol/L (ref 22–32)
Calcium: 8.6 mg/dL — ABNORMAL LOW (ref 8.9–10.3)
Chloride: 96 mmol/L — ABNORMAL LOW (ref 98–111)
Creatinine, Ser: 7.11 mg/dL — ABNORMAL HIGH (ref 0.44–1.00)
GFR, Estimated: 6 mL/min — ABNORMAL LOW (ref >60)
Glucose, Bld: 97 mg/dL (ref 70–99)
Potassium: 3.3 mmol/L — ABNORMAL LOW (ref 3.5–5.1)
Sodium: 135 mmol/L (ref 135–145)

## 2023-08-19 LAB — TYPE AND SCREEN
ABO/RH(D): O NEG
Antibody Screen: NEGATIVE
Extend sample reason: TRANSFUSED

## 2023-08-19 LAB — SURGICAL PCR SCREEN
MRSA, PCR: NEGATIVE
Staphylococcus aureus: NEGATIVE

## 2023-08-26 ENCOUNTER — Ambulatory Visit
Admission: RE | Admit: 2023-08-26 | Discharge: 2023-08-26 | Disposition: A | Discharge status: Home / Self Care | Source: Ambulatory Visit | Attending: Vascular Surgery | Admitting: Vascular Surgery

## 2023-08-26 ENCOUNTER — Ambulatory Visit: Payer: Self-pay | Admitting: Urgent Care

## 2023-08-26 ENCOUNTER — Ambulatory Visit: Payer: Self-pay | Admitting: Anesthesiology

## 2023-08-26 ENCOUNTER — Other Ambulatory Visit: Payer: Self-pay

## 2023-08-26 ENCOUNTER — Encounter: Payer: Self-pay | Admitting: Vascular Surgery

## 2023-08-26 ENCOUNTER — Telehealth (INDEPENDENT_AMBULATORY_CARE_PROVIDER_SITE_OTHER): Payer: Self-pay | Admitting: Vascular Surgery

## 2023-08-26 ENCOUNTER — Encounter
Admission: RE | Disposition: A | Discharge status: Home / Self Care | Payer: Self-pay | Source: Ambulatory Visit | Attending: Vascular Surgery

## 2023-08-26 DIAGNOSIS — N185 Chronic kidney disease, stage 5: Secondary | ICD-10-CM | POA: Diagnosis present

## 2023-08-26 DIAGNOSIS — I120 Hypertensive chronic kidney disease with stage 5 chronic kidney disease or end stage renal disease: Secondary | ICD-10-CM | POA: Insufficient documentation

## 2023-08-26 DIAGNOSIS — N186 End stage renal disease: Secondary | ICD-10-CM | POA: Diagnosis not present

## 2023-08-26 DIAGNOSIS — Z992 Dependence on renal dialysis: Secondary | ICD-10-CM | POA: Insufficient documentation

## 2023-08-26 HISTORY — PX: AV FISTULA PLACEMENT: SHX1204

## 2023-08-26 LAB — POCT I-STAT, CHEM 8
BUN: 11 mg/dL (ref 6–20)
Calcium, Ion: 1.12 mmol/L — ABNORMAL LOW (ref 1.15–1.40)
Chloride: 95 mmol/L — ABNORMAL LOW (ref 98–111)
Creatinine, Ser: 4 mg/dL — ABNORMAL HIGH (ref 0.44–1.00)
Glucose, Bld: 93 mg/dL (ref 70–99)
HCT: 26 % — ABNORMAL LOW (ref 36.0–46.0)
Hemoglobin: 8.8 g/dL — ABNORMAL LOW (ref 12.0–15.0)
Potassium: 4.1 mmol/L (ref 3.5–5.1)
Sodium: 134 mmol/L — ABNORMAL LOW (ref 135–145)
TCO2: 26 mmol/L (ref 22–32)

## 2023-08-26 SURGERY — ARTERIOVENOUS (AV) FISTULA CREATION
Anesthesia: General | Site: Arm Lower | Laterality: Left

## 2023-08-26 MED ORDER — PROPOFOL 1000 MG/100ML IV EMUL
INTRAVENOUS | Status: AC
Start: 2023-08-26 — End: 2023-08-26
  Filled 2023-08-26: qty 100

## 2023-08-26 MED ORDER — EPHEDRINE 5 MG/ML INJ
INTRAVENOUS | Status: AC
  Filled 2023-08-26: qty 5

## 2023-08-26 MED ORDER — ROCURONIUM BROMIDE 10 MG/ML (PF) SYRINGE
PREFILLED_SYRINGE | INTRAVENOUS | Status: DC | PRN
Start: 2023-08-26 — End: 2023-08-26
  Administered 2023-08-26: 50 mg via INTRAVENOUS

## 2023-08-26 MED ORDER — CEFAZOLIN SODIUM-DEXTROSE 2-4 GM/100ML-% IV SOLN
INTRAVENOUS | Status: AC
  Filled 2023-08-26: qty 100

## 2023-08-26 MED ORDER — DEXAMETHASONE SODIUM PHOSPHATE 10 MG/ML IJ SOLN
INTRAMUSCULAR | Status: AC
  Filled 2023-08-26: qty 1

## 2023-08-26 MED ORDER — SODIUM CHLORIDE 0.9 % IV SOLN: INTRAVENOUS | Status: DC

## 2023-08-26 MED ORDER — SUCCINYLCHOLINE CHLORIDE 200 MG/10ML IV SOSY
PREFILLED_SYRINGE | INTRAVENOUS | Status: AC
  Filled 2023-08-26: qty 10

## 2023-08-26 MED ORDER — ONDANSETRON HCL 4 MG/2ML IJ SOLN
INTRAMUSCULAR | Status: AC
  Filled 2023-08-26: qty 2

## 2023-08-26 MED ORDER — CHLORHEXIDINE GLUCONATE 0.12 % MT SOLN: 15.0000 mL | Freq: Once | OROMUCOSAL | Status: DC

## 2023-08-26 MED ORDER — ONDANSETRON HCL 4 MG/2ML IJ SOLN: 4.0000 mg | Freq: Once | INTRAMUSCULAR | Status: DC | PRN

## 2023-08-26 MED ORDER — BUPIVACAINE HCL (PF) 0.5 % IJ SOLN
INTRAMUSCULAR | Status: AC
  Filled 2023-08-26: qty 30

## 2023-08-26 MED ORDER — DEXAMETHASONE SODIUM PHOSPHATE 10 MG/ML IJ SOLN
INTRAMUSCULAR | Status: AC
Start: 2023-08-26 — End: 2023-08-26
  Filled 2023-08-26: qty 1

## 2023-08-26 MED ORDER — LIDOCAINE HCL (CARDIAC) PF 100 MG/5ML IV SOSY
PREFILLED_SYRINGE | INTRAVENOUS | Status: DC | PRN
  Administered 2023-08-26: 100 mg via INTRAVENOUS

## 2023-08-26 MED ORDER — PHENYLEPHRINE HCL-NACL 20-0.9 MG/250ML-% IV SOLN
INTRAVENOUS | Status: DC | PRN
  Administered 2023-08-26: 30 ug/min via INTRAVENOUS

## 2023-08-26 MED ORDER — PENTAFLUOROPROP-TETRAFLUOROETH EX AERO
INHALATION_SPRAY | CUTANEOUS | Status: AC
  Filled 2023-08-26: qty 30

## 2023-08-26 MED ORDER — DEXAMETHASONE SODIUM PHOSPHATE 10 MG/ML IJ SOLN
INTRAMUSCULAR | Status: DC | PRN
Start: 2023-08-26 — End: 2023-08-26
  Administered 2023-08-26: 10 mg via INTRAVENOUS

## 2023-08-26 MED ORDER — PROPOFOL 10 MG/ML IV BOLUS
INTRAVENOUS | Status: AC
Start: 2023-08-26 — End: 2023-08-26
  Filled 2023-08-26: qty 40

## 2023-08-26 MED ORDER — PHENYLEPHRINE HCL-NACL 20-0.9 MG/250ML-% IV SOLN
INTRAVENOUS | Status: AC
  Filled 2023-08-26: qty 250

## 2023-08-26 MED ORDER — MIDAZOLAM HCL 2 MG/2ML IJ SOLN
INTRAMUSCULAR | Status: DC | PRN
  Administered 2023-08-26: 2 mg via INTRAVENOUS

## 2023-08-26 MED ORDER — PHENYLEPHRINE 80 MCG/ML (10ML) SYRINGE FOR IV PUSH (FOR BLOOD PRESSURE SUPPORT)
PREFILLED_SYRINGE | INTRAVENOUS | Status: AC
  Filled 2023-08-26: qty 10

## 2023-08-26 MED ORDER — FENTANYL CITRATE (PF) 100 MCG/2ML IJ SOLN
INTRAMUSCULAR | Status: AC
Start: 2023-08-26 — End: 2023-08-26
  Filled 2023-08-26: qty 2

## 2023-08-26 MED ORDER — SUCCINYLCHOLINE CHLORIDE 200 MG/10ML IV SOSY
PREFILLED_SYRINGE | INTRAVENOUS | Status: DC | PRN
  Administered 2023-08-26: 100 mg via INTRAVENOUS

## 2023-08-26 MED ORDER — CHLORHEXIDINE GLUCONATE CLOTH 2 % EX PADS: 6.0000 | MEDICATED_PAD | Freq: Once | CUTANEOUS | Status: DC

## 2023-08-26 MED ORDER — HYDROCODONE-ACETAMINOPHEN 5-325 MG PO TABS: 1.0000 | ORAL_TABLET | Freq: Four times a day (QID) | ORAL | 0 refills | Status: AC | PRN

## 2023-08-26 MED ORDER — BUPIVACAINE LIPOSOME 1.3 % IJ SUSP
INTRAMUSCULAR | Status: AC
  Filled 2023-08-26: qty 20

## 2023-08-26 MED ORDER — ONDANSETRON HCL 4 MG/2ML IJ SOLN
INTRAMUSCULAR | Status: DC | PRN
  Administered 2023-08-26: 4 mg via INTRAVENOUS

## 2023-08-26 MED ORDER — MIDAZOLAM HCL 2 MG/2ML IJ SOLN
INTRAMUSCULAR | Status: AC
  Filled 2023-08-26: qty 2

## 2023-08-26 MED ORDER — SODIUM CHLORIDE 0.9 % IV SOLN
INTRAVENOUS | Status: DC | PRN
  Administered 2023-08-26: 501 mL

## 2023-08-26 MED ORDER — EPHEDRINE 5 MG/ML INJ
INTRAVENOUS | Status: AC
Start: 2023-08-26 — End: 2023-08-26
  Filled 2023-08-26: qty 5

## 2023-08-26 MED ORDER — PROPOFOL 1000 MG/100ML IV EMUL
INTRAVENOUS | Status: AC
  Filled 2023-08-26: qty 100

## 2023-08-26 MED ORDER — CEFAZOLIN SODIUM-DEXTROSE 2-4 GM/100ML-% IV SOLN
2.0000 g | INTRAVENOUS | Status: AC
  Administered 2023-08-26: 2 g via INTRAVENOUS

## 2023-08-26 MED ORDER — PROPOFOL 10 MG/ML IV BOLUS
INTRAVENOUS | Status: DC | PRN
Start: 2023-08-26 — End: 2023-08-26
  Administered 2023-08-26 (×2): 50 mg via INTRAVENOUS
  Administered 2023-08-26: 100 ug/kg/min via INTRAVENOUS

## 2023-08-26 MED ORDER — HEPARIN SODIUM (PORCINE) 5000 UNIT/ML IJ SOLN
INTRAMUSCULAR | Status: AC
  Filled 2023-08-26: qty 1

## 2023-08-26 MED ORDER — PHENYLEPHRINE 80 MCG/ML (10ML) SYRINGE FOR IV PUSH (FOR BLOOD PRESSURE SUPPORT)
PREFILLED_SYRINGE | INTRAVENOUS | Status: DC | PRN
  Administered 2023-08-26: 120 ug via INTRAVENOUS
  Administered 2023-08-26: 80 ug via INTRAVENOUS

## 2023-08-26 MED ORDER — SUGAMMADEX SODIUM 200 MG/2ML IV SOLN
INTRAVENOUS | Status: DC | PRN
  Administered 2023-08-26: 200 mg via INTRAVENOUS

## 2023-08-26 MED ORDER — VASHE WOUND IRRIGATION OPTIME
TOPICAL | Status: DC | PRN
  Administered 2023-08-26: 34 [oz_av]

## 2023-08-26 MED ORDER — FENTANYL CITRATE (PF) 100 MCG/2ML IJ SOLN
INTRAMUSCULAR | Status: DC | PRN
  Administered 2023-08-26 (×2): 50 ug via INTRAVENOUS

## 2023-08-26 MED ORDER — ORAL CARE MOUTH RINSE: 15.0000 mL | Freq: Once | OROMUCOSAL | Status: DC

## 2023-08-26 MED ORDER — PROPOFOL 10 MG/ML IV BOLUS
INTRAVENOUS | Status: AC
  Filled 2023-08-26: qty 40

## 2023-08-26 MED ORDER — BUPIVACAINE LIPOSOME 1.3 % IJ SUSP
INTRAMUSCULAR | Status: DC | PRN
  Administered 2023-08-26: 16 mL

## 2023-08-26 MED ORDER — FENTANYL CITRATE (PF) 100 MCG/2ML IJ SOLN: 25.0000 ug | INTRAMUSCULAR | Status: DC | PRN

## 2023-08-26 MED ORDER — LIDOCAINE HCL (PF) 2 % IJ SOLN
INTRAMUSCULAR | Status: AC
  Filled 2023-08-26: qty 5

## 2023-08-26 SURGICAL SUPPLY — 38 items
BAG DECANTER FOR FLEXI CONT (MISCELLANEOUS) ×2 IMPLANT
BLADE SURG SZ11 CARB STEEL (BLADE) ×3 IMPLANT
BRUSH SCRUB EZ 4% CHG (MISCELLANEOUS) ×2 IMPLANT
CHLORAPREP W/TINT 26 (MISCELLANEOUS) ×3 IMPLANT
CLAMP SUTURE YELLOW 5 PAIRS (MISCELLANEOUS) ×2 IMPLANT
CLEANSER WND VASHE 34 (WOUND CARE) ×1 IMPLANT
DERMABOND ADVANCED .7 DNX12 (GAUZE/BANDAGES/DRESSINGS) ×2 IMPLANT
DRESSING SURGICEL FIBRLLR 1X2 (HEMOSTASIS) ×2 IMPLANT
ELECT CAUTERY BLADE 6.4 (BLADE) ×2 IMPLANT
ELECTRODE REM PT RTRN 9FT ADLT (ELECTROSURGICAL) ×2 IMPLANT
GLOVE BIO SURGEON STRL SZ7 (GLOVE) ×4 IMPLANT
GLOVE SURG SYN 8.0 PF PI (GLOVE) ×3 IMPLANT
GOWN STRL REUS W/ TWL LRG LVL3 (GOWN DISPOSABLE) ×4 IMPLANT
GOWN STRL REUS W/ TWL XL LVL3 (GOWN DISPOSABLE) ×2 IMPLANT
IV NS 500ML BAXH (IV SOLUTION) ×2 IMPLANT
KIT TURNOVER KIT A (KITS) ×2 IMPLANT
LABEL OR SOLS (LABEL) ×2 IMPLANT
LOOP VESSEL MAXI 1X406 RED (MISCELLANEOUS) ×2 IMPLANT
LOOP VESSEL MINI 0.8X406 BLUE (MISCELLANEOUS) ×4 IMPLANT
MANIFOLD NEPTUNE II (INSTRUMENTS) ×2 IMPLANT
NDL FILTER BLUNT 18X1 1/2 (NEEDLE) ×1 IMPLANT
NEEDLE FILTER BLUNT 18X1 1/2 (NEEDLE) ×2 IMPLANT
NS IRRIG 500ML POUR BTL (IV SOLUTION) ×1 IMPLANT
PACK EXTREMITY ARMC (MISCELLANEOUS) ×2 IMPLANT
PAD PREP OB/GYN DISP 24X41 (PERSONAL CARE ITEMS) ×2 IMPLANT
PENCIL SMOKE EVACUATOR (MISCELLANEOUS) ×2 IMPLANT
STOCKINETTE 48X4 2 PLY STRL (GAUZE/BANDAGES/DRESSINGS) ×1 IMPLANT
STOCKINETTE STRL 4IN 9604848 (GAUZE/BANDAGES/DRESSINGS) ×2 IMPLANT
SUT MNCRL+ 5-0 UNDYED PC-3 (SUTURE) ×2 IMPLANT
SUT PROLENE 6 0 BV (SUTURE) ×4 IMPLANT
SUT SILK 2 0 SH (SUTURE) ×2 IMPLANT
SUT SILK 2-0 18XBRD TIE 12 (SUTURE) ×2 IMPLANT
SUT SILK 3-0 18XBRD TIE 12 (SUTURE) ×2 IMPLANT
SUT VIC AB 3-0 SH 27X BRD (SUTURE) ×4 IMPLANT
SYR 20ML LL LF (SYRINGE) ×2 IMPLANT
SYR 3ML LL SCALE MARK (SYRINGE) ×2 IMPLANT
TRAP FLUID SMOKE EVACUATOR (MISCELLANEOUS) ×2 IMPLANT
WATER STERILE IRR 500ML POUR (IV SOLUTION) ×1 IMPLANT

## 2023-08-26 NOTE — Anesthesia Preprocedure Evaluation (Signed)
 Anesthesia Evaluation  Patient identified by MRN, date of birth, ID band Patient awake    Reviewed: Allergy & Precautions, NPO status , Patient's Chart, lab work & pertinent test results  Airway Mallampati: II  TM Distance: >3 FB Neck ROM: full    Dental  (+) Poor Dentition   Pulmonary neg pulmonary ROS, asthma    Pulmonary exam normal breath sounds clear to auscultation       Cardiovascular Exercise Tolerance: Good hypertension, Pt. on medications negative cardio ROS Normal cardiovascular exam+ dysrhythmias  Rhythm:Regular Rate:Normal     Neuro/Psych negative neurological ROS  negative psych ROS   GI/Hepatic negative GI ROS, Neg liver ROS,,,  Endo/Other  negative endocrine ROS    Renal/GU DialysisRenal disease     Musculoskeletal negative musculoskeletal ROS (+)    Abdominal Normal abdominal exam  (+)   Peds negative pediatric ROS (+)  Hematology negative hematology ROS (+) Blood dyscrasia, anemia   Anesthesia Other Findings Past Medical History: No date: Anemia No date: Asthma No date: Chronic kidney disease     Comment:  ESRD on dialysis No date: Dysrhythmia No date: Fibroids     Comment:  Uterine No date: Hypertension No date: Pneumonia  Past Surgical History: 07/10/2023: DIALYSIS/PERMA CATHETER INSERTION; N/A     Comment:  Procedure: DIALYSIS/PERMA CATHETER INSERTION;  Surgeon:               Jama Cordella MATSU, MD;  Location: [facility] INVASIVE CV LAB;               Service: Cardiovascular;  Laterality: N/A; No date: TUBAL LIGATION  BMI    Body Mass Index: 24.41 kg/m      Reproductive/Obstetrics negative OB ROS                              Anesthesia Physical Anesthesia Plan  ASA: 3  Anesthesia Plan: General   Post-op Pain Management:    Induction: Intravenous  PONV Risk Score and Plan: Ondansetron , Dexamethasone , Midazolam  and Treatment may vary due to age or  medical condition  Airway Management Planned: Oral ETT  Additional Equipment:   Intra-op Plan:   Post-operative Plan: Extubation in OR  Informed Consent: I have reviewed the patients History and Physical, chart, labs and discussed the procedure including the risks, benefits and alternatives for the proposed anesthesia with the patient or authorized representative who has indicated his/her understanding and acceptance.     Dental Advisory Given  Plan Discussed with: CRNA  Anesthesia Plan Comments:          Anesthesia Quick Evaluation

## 2023-08-26 NOTE — Telephone Encounter (Signed)
 Spoke with patient by phone to let her know surgery went well.  She confirms she has follow-up appointment in mid August.

## 2023-08-26 NOTE — Anesthesia Postprocedure Evaluation (Signed)
 Anesthesia Post Note  Patient: Megan Lambert  Procedure(s) Performed: ARTERIOVENOUS (AV) FISTULA CREATION (Left: Arm Lower)  Patient location during evaluation: PACU Anesthesia Type: General Level of consciousness: awake Pain management: pain level controlled Vital Signs Assessment: post-procedure vital signs reviewed and stable Respiratory status: spontaneous breathing Cardiovascular status: stable Anesthetic complications: no   There were no known notable events for this encounter.   Last Vitals:  Vitals:   08/26/23 0637 08/26/23 0930  BP: 119/88 128/77  Pulse: (!) 122 (!) 102  Resp: 15 12  Temp: 36.6 C 36.7 C  SpO2: 100% 99%    Last Pain:  Vitals:   08/26/23 0930  PainSc: 0-No pain                 VAN STAVEREN,Jayven Naill

## 2023-08-26 NOTE — Telephone Encounter (Signed)
 Pt called stating that she needs a work note. Had a procedure today with [employer]. States she needs to be off work for 14 days. Please advise if this is the correct amount of days off so front desk can type up a work note for [employer] to sign. Patient states she will pick up work note when complete.

## 2023-08-26 NOTE — Interval H&P Note (Signed)
 History and Physical Interval Note:  08/26/2023 7:37 AM  Megan Lambert  has presented today for surgery, with the diagnosis of CHRONIC KIDNEY DISEASE STAGE 5.  The various methods of treatment have been discussed with the patient and family. After consideration of risks, benefits and other options for treatment, the patient has consented to  Procedure(s) with comments: INSERTION, GRAFT, ARTERIOVENOUS, UPPER EXTREMITY (Left) - RADIALCEPHALIC as a surgical intervention.  The patient's history has been reviewed, patient examined, no change in status, stable for surgery.  I have reviewed the patient's chart and labs.  Questions were answered to the patient's satisfaction.     Cordella Shawl

## 2023-08-26 NOTE — Telephone Encounter (Signed)
 We can write that, that is fine

## 2023-08-26 NOTE — Transfer of Care (Signed)
 Immediate Anesthesia Transfer of Care Note  Patient: Megan Lambert  Procedure(s) Performed: ARTERIOVENOUS (AV) FISTULA CREATION (Left: Arm Lower)  Patient Location: PACU  Anesthesia Type:General  Level of Consciousness: awake  Airway & Oxygen Therapy: Patient Spontanous Breathing  Post-op Assessment: Report given to RN and Post -op Vital signs reviewed and stable  Post vital signs: Reviewed and stable  Last Vitals:  Vitals Value Taken Time  BP 128/77 08/26/23 09:30  Temp    Pulse 104 08/26/23 09:32  Resp 19 08/26/23 09:32  SpO2 96 % 08/26/23 09:32  Vitals shown include unfiled device data.  Last Pain:  Vitals:   08/26/23 0637  PainSc: 0-No pain         Complications: There were no known notable events for this encounter.

## 2023-08-26 NOTE — Op Note (Signed)
     OPERATIVE NOTE   PROCEDURE: left brachial cephalic arteriovenous fistula placement  PRE-OPERATIVE DIAGNOSIS: End Stage Renal Disease  POST-OPERATIVE DIAGNOSIS: End Stage Renal Disease  SURGEON: Megan Lambert  ASSISTANT(S): None  ANESTHESIA: general  ESTIMATED BLOOD LOSS: <50 cc  FINDING(S): 4.5 mm cephalic vein  SPECIMEN(S):  none  INDICATIONS:   Megan Lambert is a 53 y.o. female who presents with end stage renal disease.  The patient is scheduled for left brachiocephalic arteriovenous fistula placement.  The patient is aware the risks include but are not limited to: bleeding, infection, steal syndrome, nerve damage, ischemic monomelic neuropathy, failure to mature, and need for additional procedures.  The patient is aware of the risks of the procedure and elects to proceed forward.  DESCRIPTION: After full informed written consent was obtained from the patient, the patient was brought back to the operating room and placed supine upon the operating table.  Prior to induction, the patient received IV antibiotics.   After obtaining adequate anesthesia, the patient was then prepped and draped in the standard fashion for a left arm access procedure.   A curvilinear incision was then created midway between the radial impulse and the cephalic vein. The cephalic vein was then identified and dissected circumferentially. It was marked with a surgical marker.    Attention was then turned to the brachial artery which was exposed through the same incision and looped proximally and distally. Side branches were controlled with 4-0 silk ties.  The distal segment of the vein was ligated with a  2-0 silk, and the vein was transected.  The proximal segment was interrogated with serial dilators.  The vein accepted up to a 4.5 mm dilator without any difficulty. Heparinized saline was infused into the vein and clamped it with a small bulldog.  At this point, I reset my exposure of the  brachial artery and controlled the artery with vessel loops proximally and distally.  An arteriotomy was then made with a #11 blade, and extended with a Potts scissor.  Heparinized saline was injected proximal and distal into the radial artery.  The vein was then approximated to the artery while the artery was in its native bed and subsequently the vein was beveled using Potts scissors. The vein was then sewn to the artery in an end-to-side configuration with a running stitch of 6-0 Prolene.  Prior to completing this anastomosis Flushing maneuvers were performed and the artery was allowed to forward and back bleed.  There was no evidence of clot from any vessels.  I completed the anastomosis in the usual fashion and then released all vessel loops and clamps.    There was good  thrill in the venous outflow, and there was 1+ palpable radial pulse.  At this point, I irrigated out the surgical wound.  There was no further active bleeding.  The subcutaneous tissue was reapproximated with a running stitch of 3-0 Vicryl.  The skin was then reapproximated with a running subcuticular stitch of 4-0 Vicryl.  The skin was then cleaned, dried, and reinforced with Dermabond.    The patient tolerated this procedure well.   COMPLICATIONS: None  CONDITION: Megan Lambert Megan Lambert [facility]  Office: [phone]   08/26/2023, 9:14 AM

## 2023-08-26 NOTE — H&P (Signed)
 MRN : [id number]  Megan Lambert is a 54 y.o. (1970-04-24) female who presents with chief complaint of check access.  History of Present Illness:   Patient presents to [facility] for creation of an upper extremity AV access.  The patient recently started on dialysis and had their initial tunneled catheter placed.  Catheter was placed July 10, 2023.  There have not been any previous accesses.    The patient is right handed.   Current access is via a catheter which is functioning poorly.  The patient has been told  the flow rates have been less than ideal.  There have not been any episodes of catheter infection.  The patient denies fever and chills while on dialysis.  No tenderness or drainage at the exit site.  No recent shortening of the patient's walking distance or new symptoms consistent with claudication.  No history of rest pain symptoms. No new ulcers or wounds of the lower extremities have occurred.  The patient denies amaurosis fugax or recent TIA symptoms. There are no recent neurological changes noted. There is no history of DVT, PE or superficial thrombophlebitis. No recent episodes of angina or shortness of breath documented.  Vein mapping performed in the office 01/07/2023 demonstrates the cephalic vein in the left arm is greater than 4 mm at the antecubital fossa.  It is not identified at the wrist.  Current Meds  Medication Sig   amLODipine  (NORVASC ) 10 MG tablet Take 10 mg by mouth in the morning.   ergocalciferol (VITAMIN D2) 1.25 MG (50000 UT) capsule Take 50,000 Units by mouth every Monday.   rosuvastatin (CRESTOR) 5 MG tablet Take 5 mg by mouth in the morning.   sevelamer carbonate (RENVELA) 800 MG tablet Take 800 mg by mouth daily with breakfast.    Past Medical History:  Diagnosis Date   Anemia    Asthma    Chronic kidney disease    ESRD on dialysis   Dysrhythmia    Fibroids    Uterine   Hypertension     Pneumonia     Past Surgical History:  Procedure Laterality Date   DIALYSIS/PERMA CATHETER INSERTION N/A 07/10/2023   Procedure: DIALYSIS/PERMA CATHETER INSERTION;  Surgeon: Jama Cordella MATSU, MD;  Location: [facility] INVASIVE CV LAB;  Service: Cardiovascular;  Laterality: N/A;   TUBAL LIGATION      Social History Social History   Tobacco Use   Smoking status: Never    Passive exposure: Never   Smokeless tobacco: Never  Vaping Use   Vaping status: Never Used  Substance Use Topics   Alcohol use: Not Currently    Comment: a few shots a day.   Drug use: No    Family History Family History  Problem Relation Age of Onset   Anemia Mother     Allergies  Allergen Reactions   Lisinopril Swelling     REVIEW OF SYSTEMS (Negative unless checked)  Constitutional: [] Weight loss  [] Fever  [] Chills Cardiac: [] Chest pain   [] Chest pressure   [] Palpitations   [] Shortness of breath when laying flat   [] Shortness of breath with exertion. Vascular:  [] Pain in legs with walking   [] Pain in legs at rest  [] History of DVT   [] Phlebitis   [] Swelling in legs   [] Varicose veins   [] Non-healing ulcers Pulmonary:   [] Uses home oxygen   [] Productive cough   []   Hemoptysis   [] Wheeze  [] COPD   [] Asthma Neurologic:  [] Dizziness   [] Seizures   [] History of stroke   [] History of TIA  [] Aphasia   [] Vissual changes   [] Weakness or numbness in arm   [] Weakness or numbness in leg Musculoskeletal:   [] Joint swelling   [] Joint pain   [] Low back pain Hematologic:  [] Easy bruising  [] Easy bleeding   [] Hypercoagulable state   [] Anemic Gastrointestinal:  [] Diarrhea   [] Vomiting  [] Gastroesophageal reflux/heartburn   [] Difficulty swallowing. Genitourinary:  [x] Chronic kidney disease   [] Difficult urination  [] Frequent urination   [] Blood in urine Skin:  [] Rashes   [] Ulcers  Psychological:  [] History of anxiety   []  History of major depression.  Physical Examination  Vitals:   08/26/23 0637  BP: 119/88  Pulse: (!)  122  Resp: 15  Temp: 97.9 F (36.6 C)  SpO2: 100%  Weight: 81.6 kg  Height: 6' (1.829 m)   Body mass index is 24.41 kg/m. Gen: WD/WN, NAD Head: [place]/AT, No temporalis wasting.  Ear/Nose/Throat: Hearing grossly intact, nares w/o erythema or drainage Eyes: PER, EOMI, sclera nonicteric.  Neck: Supple, no gross masses or lesions.  No JVD.  Pulmonary:  Good air movement, no audible wheezing, no use of accessory muscles.  Cardiac: RRR, precordium non-hyperdynamic. Vascular:   Cephalic vein in the antecubital fossa is palpable and soft appears to be of nice caliber Vessel Right Left  Radial Palpable Palpable  Brachial Palpable Palpable  Gastrointestinal: soft, non-distended. No guarding/no peritoneal signs.  Musculoskeletal: M/S 5/5 throughout.  No deformity.  Neurologic: CN 2-12 intact. Pain and light touch intact in extremities.  Symmetrical.  Speech is fluent. Motor exam as listed above. Psychiatric: Judgment intact, Mood & affect appropriate for pt's clinical situation. Dermatologic: No rashes or ulcers noted.  No changes consistent with cellulitis.   CBC Lab Results  Component Value Date   WBC 5.8 08/19/2023   HGB 8.8 (L) 08/26/2023   HCT 26.0 (L) 08/26/2023   MCV 85.7 08/19/2023   PLT 257 08/19/2023    BMET    Component Value Date/Time   NA 134 (L) 08/26/2023 0646   NA 135 (L) 04/25/2013 0937   K 4.1 08/26/2023 0646   K 4.0 04/25/2013 0937   CL 95 (L) 08/26/2023 0646   CL 101 04/25/2013 0937   CO2 22 08/19/2023 1319   CO2 29 04/25/2013 0937   GLUCOSE 93 08/26/2023 0646   GLUCOSE 93 04/25/2013 0937   BUN 11 08/26/2023 0646   BUN 8 04/25/2013 0937   CREATININE 4.00 (H) 08/26/2023 0646   CREATININE 6.21 (H) 11/18/2022 0846   CREATININE 1.05 04/25/2013 0937   CALCIUM  8.6 (L) 08/19/2023 1319   CALCIUM  8.4 (L) 04/25/2013 0937   GFRNONAA 6 (L) 08/19/2023 1319   GFRNONAA 7 (L) 11/18/2022 0846   GFRNONAA >60 04/25/2013 0937   GFRAA 46 (L) 03/30/2018 0411   GFRAA >60  04/25/2013 9062   Estimated Creatinine Clearance: 18.6 mL/min (A) (by C-G formula based on SCr of 4 mg/dL (H)).  COAG No results found for: INR, PROTIME  Radiology No results found.   Assessment/Plan 1. Chronic kidney disease, stage 5 (HCC) (Primary) Recommend:  At this time the patient does not have appropriate extremity access for dialysis  Patient should have a left brachiocephalic fistula created.  The risks, benefits and alternative therapies were reviewed in detail with the patient.  All questions were answered.  The patient agrees to proceed with surgery.   The patient will  follow up with me in the office after the surgery.   2. Hypertension, unspecified type Continue antihypertensive medications as already ordered, these medications have been reviewed and there are no changes at this time.     Cordella Shawl, MD  08/26/2023 7:33 AM

## 2023-08-26 NOTE — Anesthesia Procedure Notes (Signed)
 Procedure Name: Intubation Date/Time: 08/26/2023 7:53 AM  Performed by: Bonnetta Jimmey SAUNDERS, CRNAPre-anesthesia Checklist: Patient identified, Emergency Drugs available, Suction available and Patient being monitored Patient Re-evaluated:Patient Re-evaluated prior to induction Oxygen Delivery Method: Circle system utilized Preoxygenation: Pre-oxygenation with 100% oxygen Induction Type: IV induction Ventilation: Mask ventilation without difficulty Laryngoscope Size: McGrath and 3 Grade View: Grade I Tube type: Oral Tube size: 6.5 mm Number of attempts: 1 Airway Equipment and Method: Stylet and Oral airway Placement Confirmation: ETT inserted through vocal cords under direct vision, positive ETCO2 and breath sounds checked- equal and bilateral Secured at: 22 cm Tube secured with: Tape Dental Injury: Teeth and Oropharynx as per pre-operative assessment

## 2023-08-27 ENCOUNTER — Encounter (INDEPENDENT_AMBULATORY_CARE_PROVIDER_SITE_OTHER): Payer: Self-pay | Admitting: Nurse Practitioner

## 2023-08-27 ENCOUNTER — Encounter: Payer: Self-pay | Admitting: Vascular Surgery

## 2023-09-02 ENCOUNTER — Other Ambulatory Visit

## 2023-09-02 ENCOUNTER — Inpatient Hospital Stay

## 2023-09-02 DIAGNOSIS — D631 Anemia in chronic kidney disease: Secondary | ICD-10-CM

## 2023-09-02 DIAGNOSIS — I120 Hypertensive chronic kidney disease with stage 5 chronic kidney disease or end stage renal disease: Secondary | ICD-10-CM | POA: Diagnosis not present

## 2023-09-02 DIAGNOSIS — D649 Anemia, unspecified: Secondary | ICD-10-CM

## 2023-09-02 LAB — CBC WITH DIFFERENTIAL (CANCER CENTER ONLY)
Abs Immature Granulocytes: 0.06 K/uL (ref 0.00–0.07)
Basophils Absolute: 0 K/uL (ref 0.0–0.1)
Basophils Relative: 1 %
Eosinophils Absolute: 0.1 K/uL (ref 0.0–0.5)
Eosinophils Relative: 2 %
HCT: 25.1 % — ABNORMAL LOW (ref 36.0–46.0)
Hemoglobin: 8.1 g/dL — ABNORMAL LOW (ref 12.0–15.0)
Immature Granulocytes: 1 %
Lymphocytes Relative: 28 %
Lymphs Abs: 1.6 K/uL (ref 0.7–4.0)
MCH: 29.5 pg (ref 26.0–34.0)
MCHC: 32.3 g/dL (ref 30.0–36.0)
MCV: 91.3 fL (ref 80.0–100.0)
Monocytes Absolute: 0.5 K/uL (ref 0.1–1.0)
Monocytes Relative: 8 %
Neutro Abs: 3.5 K/uL (ref 1.7–7.7)
Neutrophils Relative %: 60 %
Platelet Count: 182 K/uL (ref 150–400)
RBC: 2.75 MIL/uL — ABNORMAL LOW (ref 3.87–5.11)
RDW: 20.3 % — ABNORMAL HIGH (ref 11.5–15.5)
WBC Count: 5.8 K/uL (ref 4.0–10.5)
nRBC: 0 % (ref 0.0–0.2)

## 2023-09-02 LAB — SAMPLE TO BLOOD BANK

## 2023-09-04 ENCOUNTER — Inpatient Hospital Stay

## 2023-09-04 ENCOUNTER — Encounter

## 2023-09-08 ENCOUNTER — Other Ambulatory Visit
Admission: RE | Admit: 2023-09-08 | Discharge: 2023-09-08 | Disposition: A | Discharge status: Home / Self Care | Source: Ambulatory Visit | Attending: Nephrology | Admitting: Nephrology

## 2023-09-08 DIAGNOSIS — D631 Anemia in chronic kidney disease: Secondary | ICD-10-CM | POA: Insufficient documentation

## 2023-09-08 LAB — HEMOGLOBIN: Hemoglobin: 8 g/dL — ABNORMAL LOW (ref 12.0–15.0)

## 2023-09-14 ENCOUNTER — Other Ambulatory Visit (INDEPENDENT_AMBULATORY_CARE_PROVIDER_SITE_OTHER): Payer: Self-pay | Admitting: Vascular Surgery

## 2023-09-14 DIAGNOSIS — Z9889 Other specified postprocedural states: Secondary | ICD-10-CM

## 2023-09-14 DIAGNOSIS — N186 End stage renal disease: Secondary | ICD-10-CM

## 2023-09-16 ENCOUNTER — Inpatient Hospital Stay: Attending: Internal Medicine

## 2023-09-16 ENCOUNTER — Encounter (INDEPENDENT_AMBULATORY_CARE_PROVIDER_SITE_OTHER): Payer: Self-pay | Admitting: Vascular Surgery

## 2023-09-16 ENCOUNTER — Other Ambulatory Visit (INDEPENDENT_AMBULATORY_CARE_PROVIDER_SITE_OTHER)

## 2023-09-16 ENCOUNTER — Ambulatory Visit (INDEPENDENT_AMBULATORY_CARE_PROVIDER_SITE_OTHER): Admitting: Vascular Surgery

## 2023-09-16 VITALS — BP 102/71 | HR 111 | Resp 18 | Ht 72.0 in | Wt 171.2 lb

## 2023-09-16 DIAGNOSIS — D631 Anemia in chronic kidney disease: Secondary | ICD-10-CM | POA: Diagnosis present

## 2023-09-16 DIAGNOSIS — D649 Anemia, unspecified: Secondary | ICD-10-CM

## 2023-09-16 DIAGNOSIS — N186 End stage renal disease: Secondary | ICD-10-CM

## 2023-09-16 DIAGNOSIS — N185 Chronic kidney disease, stage 5: Secondary | ICD-10-CM

## 2023-09-16 DIAGNOSIS — Z9889 Other specified postprocedural states: Secondary | ICD-10-CM | POA: Diagnosis not present

## 2023-09-16 DIAGNOSIS — I120 Hypertensive chronic kidney disease with stage 5 chronic kidney disease or end stage renal disease: Secondary | ICD-10-CM | POA: Insufficient documentation

## 2023-09-16 DIAGNOSIS — I10 Essential (primary) hypertension: Secondary | ICD-10-CM

## 2023-09-16 LAB — CBC WITH DIFFERENTIAL (CANCER CENTER ONLY)
Abs Immature Granulocytes: 0.02 K/uL (ref 0.00–0.07)
Basophils Absolute: 0.1 K/uL (ref 0.0–0.1)
Basophils Relative: 1 %
Eosinophils Absolute: 0.2 K/uL (ref 0.0–0.5)
Eosinophils Relative: 3 %
HCT: 26.4 % — ABNORMAL LOW (ref 36.0–46.0)
Hemoglobin: 8.6 g/dL — ABNORMAL LOW (ref 12.0–15.0)
Immature Granulocytes: 0 %
Lymphocytes Relative: 37 %
Lymphs Abs: 1.9 K/uL (ref 0.7–4.0)
MCH: 30.6 pg (ref 26.0–34.0)
MCHC: 32.6 g/dL (ref 30.0–36.0)
MCV: 94 fL (ref 80.0–100.0)
Monocytes Absolute: 0.5 K/uL (ref 0.1–1.0)
Monocytes Relative: 10 %
Neutro Abs: 2.5 K/uL (ref 1.7–7.7)
Neutrophils Relative %: 49 %
Platelet Count: 273 K/uL (ref 150–400)
RBC: 2.81 MIL/uL — ABNORMAL LOW (ref 3.87–5.11)
RDW: 21.1 % — ABNORMAL HIGH (ref 11.5–15.5)
WBC Count: 5.1 K/uL (ref 4.0–10.5)
nRBC: 0 % (ref 0.0–0.2)

## 2023-09-16 LAB — SAMPLE TO BLOOD BANK

## 2023-09-16 NOTE — Progress Notes (Signed)
 Subjective:    Patient ID: Megan Lambert, female    DOB: Oct 18, 1970, 53 y.o.   MRN: [id number] No chief complaint on file.   Megan Lambert is a 53 yo female who presents to clinic today for 3-week postop check of her left upper extremity brachiocephalic AV fistula.  Patient states she is doing well.  She has no complaints today.  She denies any pain to her hand or forearm such as steal syndrome.  Incision sites clean dry and intact and healing well.  There is no signs or symptoms of hematoma seroma or infection.  She states that her forearm is cool to touch and she can feel blood running on a pulse through the graft if she feels her upper arm.  She continues to go to dialysis 3 times a week.  Patient underwent HDA ultrasound today of her new graft.  Flow is 1208 mL/min.  On ultrasound fistula was widely patent.    Review of Systems  Constitutional: Negative.   Skin:  Positive for wound.  All other systems reviewed and are negative.      Objective:   Physical Exam Vitals reviewed.  Constitutional:      Appearance: Normal appearance. She is normal weight.  HENT:     Head: Normocephalic.  Eyes:     Pupils: Pupils are equal, round, and reactive to light.  Cardiovascular:     Rate and Rhythm: Normal rate and regular rhythm.     Pulses: Normal pulses.     Heart sounds: Normal heart sounds.  Pulmonary:     Effort: Pulmonary effort is normal.     Breath sounds: Normal breath sounds.  Abdominal:     General: Abdomen is flat. Bowel sounds are normal.     Palpations: Abdomen is soft.  Musculoskeletal:        General: Tenderness present.     Comments: Tenderness to left upper arm incision for AV fistula.  Skin:    General: Skin is warm and dry.     Capillary Refill: Capillary refill takes 2 to 3 seconds.  Neurological:     General: No focal deficit present.     Mental Status: She is alert and oriented to person, place, and time. Mental status is at baseline.  Psychiatric:         Mood and Affect: Mood normal.        Behavior: Behavior normal.        Thought Content: Thought content normal.        Judgment: Judgment normal.     LMP  (LMP Unknown)   Past Medical History:  Diagnosis Date   Anemia    Asthma    Chronic kidney disease    ESRD on dialysis   Dysrhythmia    Fibroids    Uterine   Hypertension    Pneumonia     Social History   Socioeconomic History   Marital status: Single    Spouse name: Not on file   Number of children: Not on file   Years of education: Not on file   Highest education level: Not on file  Occupational History   Not on file  Tobacco Use   Smoking status: Never    Passive exposure: Never   Smokeless tobacco: Never  Vaping Use   Vaping status: Never Used  Substance and Sexual Activity   Alcohol use: Not Currently    Comment: a few shots a day.   Drug use: No  Sexual activity: Not Currently  Other Topics Concern   Not on file  Social History Narrative   Lives with Mom   Social Drivers of Health   Financial Resource Strain: Medium Risk (04/16/2022)   Received from [facility]   Overall Financial Resource Strain (CARDIA)    Difficulty of Paying Living Expenses: Somewhat hard  Food Insecurity: No Food Insecurity (10/20/2022)   Hunger Vital Sign    Worried About Running Out of Food in the Last Year: Never true    Ran Out of Food in the Last Year: Never true  Transportation Needs: No Transportation Needs (10/20/2022)   PRAPARE - Administrator, Civil Service (Medical): No    Lack of Transportation (Non-Medical): No  Physical Activity: Not on file  Stress: No Stress Concern Present (04/16/2022)   Received from [facility] - Occupational Stress Questionnaire    Feeling of Stress : Only a little  Social Connections: Not on file  Intimate Partner Violence: Not At Risk (10/20/2022)   Humiliation, Afraid, Rape, and Kick questionnaire    Fear of Current or  Ex-Partner: No    Emotionally Abused: No    Physically Abused: No    Sexually Abused: No    Past Surgical History:  Procedure Laterality Date   AV FISTULA PLACEMENT Left 08/26/2023   Procedure: ARTERIOVENOUS (AV) FISTULA CREATION;  Surgeon: Jama Cordella MATSU, MD;  Location: [facility] ORS;  Service: Vascular;  Laterality: Left;  BRACHIOCEPHALIC   DIALYSIS/PERMA CATHETER INSERTION N/A 07/10/2023   Procedure: DIALYSIS/PERMA CATHETER INSERTION;  Surgeon: Jama Cordella MATSU, MD;  Location: [facility] INVASIVE CV LAB;  Service: Cardiovascular;  Laterality: N/A;   TUBAL LIGATION      Family History  Problem Relation Age of Onset   Anemia Mother     Allergies  Allergen Reactions   Lisinopril Swelling       Latest Ref Rng & Units 09/16/2023    9:55 AM 09/08/2023    6:05 AM 09/02/2023    8:20 AM  CBC  WBC 4.0 - 10.5 K/uL 5.1   5.8   Hemoglobin 12.0 - 15.0 g/dL 8.6  8.0  8.1   Hematocrit 36.0 - 46.0 % 26.4   25.1   Platelets 150 - 400 K/uL 273   182       CMP     Component Value Date/Time   NA 134 (L) 08/26/2023 0646   NA 135 (L) 04/25/2013 0937   K 4.1 08/26/2023 0646   K 4.0 04/25/2013 0937   CL 95 (L) 08/26/2023 0646   CL 101 04/25/2013 0937   CO2 22 08/19/2023 1319   CO2 29 04/25/2013 0937   GLUCOSE 93 08/26/2023 0646   GLUCOSE 93 04/25/2013 0937   BUN 11 08/26/2023 0646   BUN 8 04/25/2013 0937   CREATININE 4.00 (H) 08/26/2023 0646   CREATININE 6.21 (H) 11/18/2022 0846   CREATININE 1.05 04/25/2013 0937   CALCIUM  8.6 (L) 08/19/2023 1319   CALCIUM  8.4 (L) 04/25/2013 0937   PROT 7.8 06/26/2023 1356   PROT 8.1 04/25/2013 0937   ALBUMIN  4.0 06/26/2023 1356   ALBUMIN  4.2 04/25/2013 0937   AST 39 06/26/2023 1356   AST 111 (H) 04/25/2013 0937   ALT 23 06/26/2023 1356   ALT 84 (H) 04/25/2013 0937   ALKPHOS 102 06/26/2023 1356   ALKPHOS 70 04/25/2013 0937   BILITOT 0.5 06/26/2023 1356   BILITOT 0.7 04/25/2013 0937   GFRNONAA  6 (L) 08/19/2023 1319   GFRNONAA 7 (L) 11/18/2022 0846    GFRNONAA >60 04/25/2013 0937     No results found.     Assessment & Plan:   1. S/P arteriovenous (AV) fistula creation (Primary) Patient returns to clinic today 3 weeks postoperative left upper extremity radiocephalic AV fistula placement.  Patient denies having any pain or discomfort to the incision site or her hand.  She endorses good handgrips and strength today.  There is no signs or symptoms of infection hematoma or seroma.  HDA ultrasound today shows patency throughout.  She has a flow volume of 1208 mL/min. Left radiocephalic AV fistula will be matured on October 12, 2023 and ready to be used for dialysis.  Patient was made aware of this timeframe and date.  Patient was informed that once the fistula is working well and can be used for dialysis on a regular basis we will remove the dialysis permacatheter to her right chest.  She verbalized understanding.  Patient will follow-up in vein and vascular clinic in mid October for 93-month check of her new fistula.  2. Hypertension, unspecified type Continue antihypertensive medications as already ordered, these medications have been reviewed and there are no changes at this time.  3. Chronic kidney disease, stage 5 (HCC) Due to patient's chronic kidney disease stage V patient was found to be a candidate for IV access for creation of a radiocephalic AV fistula.  Patient continues to have right upper chest dialysis permacatheter access that will stay in place until AV fistula is able to be used.   Current Outpatient Medications on File Prior to Visit  Medication Sig Dispense Refill   amLODipine  (NORVASC ) 10 MG tablet Take 10 mg by mouth in the morning.     Darbepoetin Alfa  (ARANESP ) 200 MCG/0.4ML SOSY injection Inject 0.4 mLs (200 mcg total) into the skin every 28 (twenty-eight) days. Inject 200 mcg aranesp  syringe every 4 weeks . Hold if Hgb greater then 10. 4.8 mL 0   ergocalciferol (VITAMIN D2) 1.25 MG (50000 UT) capsule Take 50,000  Units by mouth every Monday.     HYDROcodone -acetaminophen  (NORCO/VICODIN) 5-325 MG tablet Take 1 tablet by mouth every 6 (six) hours as needed for moderate pain (pain score 4-6) or severe pain (pain score 7-10). 30 tablet 0   rosuvastatin (CRESTOR) 5 MG tablet Take 5 mg by mouth in the morning.     sevelamer carbonate (RENVELA) 800 MG tablet Take 800 mg by mouth daily with breakfast.     No current facility-administered medications on file prior to visit.    There are no Patient Instructions on file for this visit. No follow-ups on file.   Gwendlyn JONELLE Shank, NP

## 2023-09-17 ENCOUNTER — Telehealth: Payer: Self-pay | Admitting: Internal Medicine

## 2023-09-17 NOTE — Telephone Encounter (Signed)
 Called and spoke to pts mother and let her know that Megan Lambert does not need to come tomorrow for blood. She said she would let her daughter know.

## 2023-09-18 ENCOUNTER — Encounter

## 2023-09-18 ENCOUNTER — Inpatient Hospital Stay

## 2023-09-28 NOTE — Progress Notes (Unsigned)
 Megan Lambert [id number] December 01, 1970   Chief Complaint:  Referring Provider: Dasie Tinnie MATSU, NP Primary GI MD: Sampson  HPI: Megan Lambert is a 53 y.o. female with past medical history of anemia, asthma, ESRD on dialysis, HTN, renal cyst, granuloma of liver who presents today for evaluation of anemia.    Seen by [facility] 04/29/2022 having been found to incidentally have punctate right hepatic lobe granulomas and splenic granulomas on a CT scan in 03/2020. There has not been additional follow up imaging to date. CXR on 03/13/20 suggests no evidence of granulomatous disease. Quantiferon Gold on 03/13/20 is negative. Liver enzymes and function are normal, with only mild elevation in AST. Clinical question is: further management/work up. My recommendations are as follows: This patient likely has evidence of prior granulomatous disease that is no longer active. She has been ruled out for latent TB already with a Quantiferon Gold that was negative. If there is concern for exposure to TB, then a repeat Quantiferon Gold should be performed. Otherwise, with normal liver enzymes and function, she should not have further evaluation and just undergo routine monitoring of her liver enzymes periodically through her PCP. If her enzymes (specifically ALT and Alk Phos) were to rise, she could then be referred to us  for further evaluation.   Follows with oncology for anemia in setting of chronic kidney disease.  Had a televisit with them 07/28/2023.  Noted to be receiving EPO, transfusions, and iron  infusions at dialysis.  Seen by PCP 09/18/2023.  Noted to require frequent transfusions due to symptomatic anemia.  Due for colonoscopy.   Previous GI Procedures/Imaging      Past Medical History:  Diagnosis Date   Anemia    Asthma    Chronic kidney disease    ESRD on dialysis   Dysrhythmia    Fibroids    Uterine   Hypertension    Pneumonia     Past Surgical History:  Procedure Laterality  Date   AV FISTULA PLACEMENT Left 08/26/2023   Procedure: ARTERIOVENOUS (AV) FISTULA CREATION;  Surgeon: Jama Cordella MATSU, MD;  Location: [facility] ORS;  Service: Vascular;  Laterality: Left;  BRACHIOCEPHALIC   DIALYSIS/PERMA CATHETER INSERTION N/A 07/10/2023   Procedure: DIALYSIS/PERMA CATHETER INSERTION;  Surgeon: Jama Cordella MATSU, MD;  Location: [facility] INVASIVE CV LAB;  Service: Cardiovascular;  Laterality: N/A;   TUBAL LIGATION      Current Outpatient Medications  Medication Sig Dispense Refill   amLODipine  (NORVASC ) 10 MG tablet Take 10 mg by mouth in the morning.     Darbepoetin Alfa  (ARANESP ) 200 MCG/0.4ML SOSY injection Inject 0.4 mLs (200 mcg total) into the skin every 28 (twenty-eight) days. Inject 200 mcg aranesp  syringe every 4 weeks . Hold if Hgb greater then 10. 4.8 mL 0   ergocalciferol (VITAMIN D2) 1.25 MG (50000 UT) capsule Take 50,000 Units by mouth every Monday.     HYDROcodone -acetaminophen  (NORCO/VICODIN) 5-325 MG tablet Take 1 tablet by mouth every 6 (six) hours as needed for moderate pain (pain score 4-6) or severe pain (pain score 7-10). 30 tablet 0   rosuvastatin (CRESTOR) 5 MG tablet Take 5 mg by mouth in the morning.     sevelamer carbonate (RENVELA) 800 MG tablet Take 800 mg by mouth daily with breakfast.     No current facility-administered medications for this visit.    Allergies as of 09/29/2023 - Review Complete 09/16/2023  Allergen Reaction Noted   Lisinopril Swelling 01/26/2015    Family History  Problem Relation  Age of Onset   Anemia Mother     Social History   Tobacco Use   Smoking status: Never    Passive exposure: Never   Smokeless tobacco: Never  Vaping Use   Vaping status: Never Used  Substance Use Topics   Alcohol use: Not Currently    Comment: a few shots a day.   Drug use: No     Review of Systems:    Constitutional: No weight loss, fever, chills, weakness or fatigue Eyes: No change in vision Ears, Nose, Throat:  No change in hearing  or congestion Skin: No rash or itching Cardiovascular: No chest pain, chest pressure or palpitations   Respiratory: No SOB or cough Gastrointestinal: See HPI and otherwise negative Genitourinary: No dysuria or change in urinary frequency Neurological: No headache, dizziness or syncope Musculoskeletal: No new muscle or joint pain Hematologic: No bleeding or bruising    Physical Exam:  Vital signs: LMP  (LMP Unknown)   Constitutional: NAD, Well developed, Well nourished, alert and cooperative Head:  Normocephalic and atraumatic.  Eyes: No scleral icterus. Conjunctiva pink. Mouth: No oral lesions. Respiratory: Respirations even and unlabored. Lungs clear to auscultation bilaterally.  No wheezes, crackles, or rhonchi.  Cardiovascular:  Regular rate and rhythm. No murmurs. No peripheral edema. Gastrointestinal:  Soft, nondistended, nontender. No rebound or guarding. Normal bowel sounds. No appreciable masses or hepatomegaly. Rectal:  Not performed.  Neurologic:  Alert and oriented x4;  grossly normal neurologically.  Skin:   Dry and intact without significant lesions or rashes. Psychiatric: Oriented to person, place and time. Demonstrates good judgement and reason without abnormal affect or behaviors.   RELEVANT LABS AND IMAGING: CBC    Component Value Date/Time   WBC 5.1 09/16/2023 0955   WBC 5.8 08/19/2023 1319   RBC 2.81 (L) 09/16/2023 0955   HGB 8.6 (L) 09/16/2023 0955   HGB 12.3 04/25/2013 0937   HCT 26.4 (L) 09/16/2023 0955   HCT 37.5 04/25/2013 0937   PLT 273 09/16/2023 0955   PLT 198 04/25/2013 0937   MCV 94.0 09/16/2023 0955   MCV 85 04/25/2013 0937   MCH 30.6 09/16/2023 0955   MCHC 32.6 09/16/2023 0955   RDW 21.1 (H) 09/16/2023 0955   RDW 17.2 (H) 04/25/2013 0937   LYMPHSABS 1.9 09/16/2023 0955   LYMPHSABS 1.9 04/25/2013 0937   MONOABS 0.5 09/16/2023 0955   MONOABS 0.5 04/25/2013 0937   EOSABS 0.2 09/16/2023 0955   EOSABS 0.1 04/25/2013 0937   BASOSABS 0.1  09/16/2023 0955   BASOSABS 0.1 04/25/2013 0937    CMP     Component Value Date/Time   NA 134 (L) 08/26/2023 0646   NA 135 (L) 04/25/2013 0937   K 4.1 08/26/2023 0646   K 4.0 04/25/2013 0937   CL 95 (L) 08/26/2023 0646   CL 101 04/25/2013 0937   CO2 22 08/19/2023 1319   CO2 29 04/25/2013 0937   GLUCOSE 93 08/26/2023 0646   GLUCOSE 93 04/25/2013 0937   BUN 11 08/26/2023 0646   BUN 8 04/25/2013 0937   CREATININE 4.00 (H) 08/26/2023 0646   CREATININE 6.21 (H) 11/18/2022 0846   CREATININE 1.05 04/25/2013 0937   CALCIUM  8.6 (L) 08/19/2023 1319   CALCIUM  8.4 (L) 04/25/2013 0937   PROT 7.8 06/26/2023 1356   PROT 8.1 04/25/2013 0937   ALBUMIN  4.0 06/26/2023 1356   ALBUMIN  4.2 04/25/2013 0937   AST 39 06/26/2023 1356   AST 111 (H) 04/25/2013 0937   ALT 23 06/26/2023  1356   ALT 84 (H) 04/25/2013 0937   ALKPHOS 102 06/26/2023 1356   ALKPHOS 70 04/25/2013 0937   BILITOT 0.5 06/26/2023 1356   BILITOT 0.7 04/25/2013 0937   GFRNONAA 6 (L) 08/19/2023 1319   GFRNONAA 7 (L) 11/18/2022 0846   GFRNONAA >60 04/25/2013 0937   GFRAA 46 (L) 03/30/2018 0411   GFRAA >60 04/25/2013 9062     Assessment/Plan:       Camie Furbish, DEVON[facility] 09/28/2023, 7:02 PM  Patient Care Team: [facility] as PCP - General Rennie Cindy SAUNDERS, MD as Consulting Physician (Oncology) [place], Teodoro K, MD as Consulting Physician (Gastroenterology)

## 2023-09-29 ENCOUNTER — Encounter: Payer: Self-pay | Admitting: Gastroenterology

## 2023-09-29 ENCOUNTER — Ambulatory Visit (INDEPENDENT_AMBULATORY_CARE_PROVIDER_SITE_OTHER): Admitting: Gastroenterology

## 2023-09-29 VITALS — BP 106/74 | HR 104 | Ht 72.0 in | Wt 178.0 lb

## 2023-09-29 DIAGNOSIS — D509 Iron deficiency anemia, unspecified: Secondary | ICD-10-CM

## 2023-09-29 DIAGNOSIS — Z1211 Encounter for screening for malignant neoplasm of colon: Secondary | ICD-10-CM

## 2023-09-29 DIAGNOSIS — D649 Anemia, unspecified: Secondary | ICD-10-CM | POA: Diagnosis not present

## 2023-09-29 DIAGNOSIS — Z992 Dependence on renal dialysis: Secondary | ICD-10-CM

## 2023-09-29 DIAGNOSIS — N186 End stage renal disease: Secondary | ICD-10-CM | POA: Diagnosis not present

## 2023-09-29 DIAGNOSIS — N185 Chronic kidney disease, stage 5: Secondary | ICD-10-CM

## 2023-09-29 DIAGNOSIS — K5900 Constipation, unspecified: Secondary | ICD-10-CM

## 2023-09-29 DIAGNOSIS — K219 Gastro-esophageal reflux disease without esophagitis: Secondary | ICD-10-CM

## 2023-09-29 DIAGNOSIS — D631 Anemia in chronic kidney disease: Secondary | ICD-10-CM

## 2023-09-29 MED ORDER — NA SULFATE-K SULFATE-MG SULF 17.5-3.13-1.6 GM/177ML PO SOLN: 1.0000 | Freq: Once | ORAL | 0 refills | Status: AC

## 2023-09-29 NOTE — Patient Instructions (Addendum)
 You have been scheduled for a colonoscopy. Please follow written instructions given to you at your visit today.   If you use inhalers (even only as needed), please bring them with you on the day of your procedure.  DO NOT TAKE 7 DAYS PRIOR TO TEST- Trulicity (dulaglutide) Ozempic, Wegovy (semaglutide) Mounjaro (tirzepatide) Bydureon Bcise (exanatide extended release)  DO NOT TAKE 1 DAY PRIOR TO YOUR TEST Rybelsus (semaglutide) Adlyxin (lixisenatide) Victoza (liraglutide) Byetta (exanatide) ___________________________________________________________________________  _______________________________________________________  If your blood pressure at your visit was 140/90 or greater, please contact your primary care physician to follow up on this.  _______________________________________________________  If you are age 49 or older, your body mass index should be between 23-30. Your Body mass index is 24.14 kg/m. If this is out of the aforementioned range listed, please consider follow up with your Primary Care Provider.  If you are age 60 or younger, your body mass index should be between 19-25. Your Body mass index is 24.14 kg/m. If this is out of the aformentioned range listed, please consider follow up with your Primary Care Provider.   ________________________________________________________  The [facility] providers would like to encourage you to use MYCHART to communicate with providers for non-urgent requests or questions.  Due to long hold times on the telephone, sending your provider a message by [facility] may be a faster and more efficient way to get a response.  Please allow 48 business hours for a response.  Please remember that this is for non-urgent requests.  _______________________________________________________  [facility] is using a team-based approach to care.  Your team is made up of your doctor and two to three APPS. Our APPS (Nurse Practitioners and  Physician Assistants) work with your physician to ensure care continuity for you. They are fully qualified to address your health concerns and develop a treatment plan. They communicate directly with your gastroenterologist to care for you. Seeing the Advanced Practice Practitioners on your physician's team can help you by facilitating care more promptly, often allowing for earlier appointments, access to diagnostic testing, procedures, and other specialty referrals.

## 2023-09-30 ENCOUNTER — Inpatient Hospital Stay

## 2023-09-30 DIAGNOSIS — I120 Hypertensive chronic kidney disease with stage 5 chronic kidney disease or end stage renal disease: Secondary | ICD-10-CM | POA: Diagnosis not present

## 2023-09-30 DIAGNOSIS — D631 Anemia in chronic kidney disease: Secondary | ICD-10-CM

## 2023-09-30 DIAGNOSIS — D649 Anemia, unspecified: Secondary | ICD-10-CM

## 2023-09-30 LAB — CBC WITH DIFFERENTIAL (CANCER CENTER ONLY)
Abs Immature Granulocytes: 0.03 K/uL (ref 0.00–0.07)
Basophils Absolute: 0.1 K/uL (ref 0.0–0.1)
Basophils Relative: 1 %
Eosinophils Absolute: 0.1 K/uL (ref 0.0–0.5)
Eosinophils Relative: 2 %
HCT: 27.1 % — ABNORMAL LOW (ref 36.0–46.0)
Hemoglobin: 8.9 g/dL — ABNORMAL LOW (ref 12.0–15.0)
Immature Granulocytes: 1 %
Lymphocytes Relative: 35 %
Lymphs Abs: 1.8 K/uL (ref 0.7–4.0)
MCH: 30.5 pg (ref 26.0–34.0)
MCHC: 32.8 g/dL (ref 30.0–36.0)
MCV: 92.8 fL (ref 80.0–100.0)
Monocytes Absolute: 0.5 K/uL (ref 0.1–1.0)
Monocytes Relative: 9 %
Neutro Abs: 2.7 K/uL (ref 1.7–7.7)
Neutrophils Relative %: 52 %
Platelet Count: 207 K/uL (ref 150–400)
RBC: 2.92 MIL/uL — ABNORMAL LOW (ref 3.87–5.11)
RDW: 20.2 % — ABNORMAL HIGH (ref 11.5–15.5)
WBC Count: 5.2 K/uL (ref 4.0–10.5)
nRBC: 0 % (ref 0.0–0.2)

## 2023-09-30 LAB — SAMPLE TO BLOOD BANK

## 2023-10-02 ENCOUNTER — Inpatient Hospital Stay

## 2023-10-06 NOTE — Progress Notes (Signed)
 Agree with the assessment and plan as outlined by Camie Furbish, PA-C.

## 2023-10-09 ENCOUNTER — Telehealth: Payer: Self-pay | Admitting: Internal Medicine

## 2023-10-09 NOTE — Telephone Encounter (Signed)
 Pt called to change the days of her appts as her dialysis days have changed. Appts are changed and pt aware of new date and time. Pt also aware that there is a little bit of a wait time between lab and MD appt on 9/9 due to the schedules and pt understood and was okay with this.

## 2023-10-13 ENCOUNTER — Telehealth: Payer: Self-pay | Admitting: *Deleted

## 2023-10-13 ENCOUNTER — Inpatient Hospital Stay: Admitting: Internal Medicine

## 2023-10-13 ENCOUNTER — Inpatient Hospital Stay

## 2023-10-13 NOTE — Telephone Encounter (Signed)
 Phone call to patient made concerning her missed appt this morning. She said she slept through it. She didn't think she needed as her hgb was 9 at dialysis yesterday. Appts made and given to patient for next week.

## 2023-10-14 ENCOUNTER — Other Ambulatory Visit

## 2023-10-14 ENCOUNTER — Ambulatory Visit: Admitting: Internal Medicine

## 2023-10-15 ENCOUNTER — Ambulatory Visit: Admitting: Nurse Practitioner

## 2023-10-15 ENCOUNTER — Inpatient Hospital Stay

## 2023-10-16 ENCOUNTER — Encounter

## 2023-10-20 ENCOUNTER — Telehealth: Payer: Self-pay | Admitting: *Deleted

## 2023-10-20 ENCOUNTER — Inpatient Hospital Stay: Attending: Internal Medicine

## 2023-10-20 ENCOUNTER — Inpatient Hospital Stay: Admitting: Nurse Practitioner

## 2023-10-20 NOTE — Telephone Encounter (Signed)
 Patient no showed for her apts with [facility] today for lab recheck and possible blood transfusion. I attempted to reach her no answer. Phone just rings.  Reviewed chart. She missed last week's apts as well, which were r/s to today. Pt shared that her hgb last wk was 9 at dialysis and she declined apts for last week. Pt was given new apts for this week.

## 2023-10-21 ENCOUNTER — Telehealth: Payer: Self-pay | Admitting: Internal Medicine

## 2023-10-21 NOTE — Telephone Encounter (Signed)
 Called patient to let her know her blood appointment for Friday has been cancelled due to not having lab work done. Patient states she had to go out of town for a funeral on Wednesday and forgot about appointment. She also states when she left Dialysis on Tuesday, her blood was 9.3 so they told her she didn't need to come. Patient says she will talk to kidney doctor on Friday to see if she needs to continue coming to appointments at [facility] center since they referred her here and she seems to be doing fine. She will call to schedule appointment after her visit on Friday if she needs to be seen.

## 2023-10-22 ENCOUNTER — Inpatient Hospital Stay

## 2023-10-26 ENCOUNTER — Encounter (HOSPITAL_COMMUNITY): Payer: Self-pay | Admitting: Gastroenterology

## 2023-10-26 ENCOUNTER — Telehealth: Payer: Self-pay | Admitting: Gastroenterology

## 2023-10-26 NOTE — Telephone Encounter (Addendum)
 Procedure:Colonoscopy/Endoscopy Procedure date: 11/02/23 Procedure location: [facility] Arrival Time: 6:00 AM Spoke with the patient Y/N: Yes Any prep concerns? No  Has the patient obtained the prep from the pharmacy ? Yes Do you have a care partner and transportation: Yes Any additional concerns? No

## 2023-10-26 NOTE — Progress Notes (Signed)
 Attempted to obtain medical history for pre op call via telephone, unable to reach at this time. HIPAA compliant voicemail message left requesting return call to pre surgical testing department.

## 2023-10-30 ENCOUNTER — Telehealth: Payer: Self-pay | Admitting: Gastroenterology

## 2023-10-30 ENCOUNTER — Telehealth (HOSPITAL_COMMUNITY): Payer: Self-pay

## 2023-10-30 NOTE — Telephone Encounter (Signed)
 Megan Lambert was scheduled for Endo Procedure with Dr. Stacia on 11/02/23 , at [facility].   Patient/or family called on 10/30/23  to cancel their procedure due to a family death.  Dr Stacia & office notified. Patient instructed to call physician's office to reschedule their procedure. Patient demonstrated understanding.

## 2023-10-30 NOTE — Telephone Encounter (Signed)
 Inbound call from patient stating that she had a coloscopy scheduled for 9/29 and it was canceled due to a death in her family and is requesting a call back to discuss getting rescheduled. Please advise.

## 2023-11-02 ENCOUNTER — Ambulatory Visit (HOSPITAL_COMMUNITY): Admission: RE | Admit: 2023-11-02 | Source: Home / Self Care | Admitting: Gastroenterology

## 2023-11-02 ENCOUNTER — Other Ambulatory Visit: Payer: Self-pay

## 2023-11-02 ENCOUNTER — Encounter (HOSPITAL_COMMUNITY): Admission: RE | Payer: Self-pay | Source: Home / Self Care

## 2023-11-02 ENCOUNTER — Telehealth: Payer: Self-pay

## 2023-11-02 DIAGNOSIS — K5900 Constipation, unspecified: Secondary | ICD-10-CM

## 2023-11-02 DIAGNOSIS — K219 Gastro-esophageal reflux disease without esophagitis: Secondary | ICD-10-CM

## 2023-11-02 DIAGNOSIS — D509 Iron deficiency anemia, unspecified: Secondary | ICD-10-CM

## 2023-11-02 DIAGNOSIS — Z1211 Encounter for screening for malignant neoplasm of colon: Secondary | ICD-10-CM

## 2023-11-02 SURGERY — COLONOSCOPY
Anesthesia: Monitor Anesthesia Care

## 2023-11-02 NOTE — Telephone Encounter (Signed)
 ECL rescheduled. Please see TE from 11/02/23

## 2023-11-02 NOTE — Telephone Encounter (Signed)
 Spoke with pt and rescheduled ECL. Please see TE from 11/02/23.

## 2023-11-02 NOTE — Telephone Encounter (Signed)
 Pts ECL rescheduled with Dr Stacia on 10/21 and new instructions mailed per pt request. Pt informed of new procedure D and T. She has no further questions at this moment.   Electronically signed:  Corean Amsterdam, NCMA

## 2023-11-16 ENCOUNTER — Encounter (INDEPENDENT_AMBULATORY_CARE_PROVIDER_SITE_OTHER): Payer: Self-pay | Admitting: Vascular Surgery

## 2023-11-16 ENCOUNTER — Ambulatory Visit (INDEPENDENT_AMBULATORY_CARE_PROVIDER_SITE_OTHER): Admitting: Vascular Surgery

## 2023-11-16 VITALS — BP 80/50 | HR 106 | Resp 18 | Ht 72.0 in | Wt 178.8 lb

## 2023-11-16 DIAGNOSIS — Z9889 Other specified postprocedural states: Secondary | ICD-10-CM

## 2023-11-16 DIAGNOSIS — I10 Essential (primary) hypertension: Secondary | ICD-10-CM

## 2023-11-16 DIAGNOSIS — N186 End stage renal disease: Secondary | ICD-10-CM

## 2023-11-16 NOTE — Progress Notes (Signed)
 Subjective:    Patient ID: Megan Lambert, female    DOB: [birth year], 53 y.o.   MRN: [id number] Chief Complaint  Patient presents with   Follow-up    Follow up - HDA L arm    Megan Lambert is a 53 yo female who returns to clinic today for follow-up from:  PROCEDURE: 08/26/2023 left brachial cephalic arteriovenous fistula placement.   Patient is left upper arm is well-healed today.  She has a good thrill and bruit.  Therefore patient's outpatient dialysis clinic may start using the left brachiocephalic fistula that was created for her outpatient dialysis.  Patient continues to have a right chest dialysis permacatheter.  Once her left AV fistula has been used successfully by her dialysis clinic we can remove the dialysis permacatheter.  Patient will be rescheduled for that based on her dialysis treatment center contacting us .  Patient has no other complaints today.  Patient endorses that her primary care took her off her amlodipine  for low blood pressures.  Apparently at her dialysis center she has had hypotension a couple of times and therefore was unable to get dialysis.  Today her blood pressure is 120/70 and is normal with a heart rate of 75.  She did receive dialysis earlier today via permacatheter.  Vitals all remained stable.     Review of Systems  Constitutional: Negative.   Skin:  Positive for wound.       Left brachiocephalic fistula healed well.  All other systems reviewed and are negative.      Objective:   Physical Exam Constitutional:      Appearance: Normal appearance. She is normal weight.  HENT:     Head: Normocephalic and atraumatic.  Eyes:     Pupils: Pupils are equal, round, and reactive to light.  Cardiovascular:     Rate and Rhythm: Normal rate and regular rhythm.     Pulses: Normal pulses.     Heart sounds: Normal heart sounds.  Pulmonary:     Effort: Pulmonary effort is normal.     Breath sounds: Normal breath sounds.  Abdominal:     General:  Abdomen is flat. Bowel sounds are normal.     Palpations: Abdomen is soft.  Musculoskeletal:        General: Normal range of motion.     Comments: Left upper extremity AV fistula placement well-healed.  Skin:    General: Skin is warm and dry.     Capillary Refill: Capillary refill takes 2 to 3 seconds.  Neurological:     General: No focal deficit present.     Mental Status: She is alert and oriented to person, place, and time. Mental status is at baseline.  Psychiatric:        Mood and Affect: Mood normal.        Behavior: Behavior normal.        Thought Content: Thought content normal.        Judgment: Judgment normal.     BP (!) 80/50 (BP Location: Right Arm, Patient Position: Sitting)   Pulse (!) 106   Resp 18   Ht 6' (1.829 m)   Wt 178 lb 12.8 oz (81.1 kg)   LMP  (LMP Unknown)   BMI 24.25 kg/m   Past Medical History:  Diagnosis Date   Anemia    Asthma    Chronic kidney disease    ESRD on dialysis   Dysrhythmia    Fibroids    Uterine   Hypertension  Pneumonia     Social History   Socioeconomic History   Marital status: Single    Spouse name: Not on file   Number of children: Not on file   Years of education: Not on file   Highest education level: Not on file  Occupational History   Not on file  Tobacco Use   Smoking status: Never    Passive exposure: Never   Smokeless tobacco: Never  Vaping Use   Vaping status: Never Used  Substance and Sexual Activity   Alcohol use: Yes    Comment: a few shots a day.   Drug use: No   Sexual activity: Not Currently  Other Topics Concern   Not on file  Social History Narrative   Lives with Mom   Social Drivers of Health   Financial Resource Strain: Medium Risk (04/16/2022)   Received [facility]   Overall Financial Resource Strain (CARDIA)    Difficulty of Paying Living Expenses: Somewhat hard  Food Insecurity: No Food Insecurity (09/18/2023)   Received from [facility]   Hunger Vital Sign     Within the past 12 months, you worried that your food would run out before you got the money to buy more.: Never true    Within the past 12 months, the food you bought just didn't last and you didn't have money to get more.: Never true  Transportation Needs: No Transportation Needs (09/18/2023)   Received from [facility] - Transportation    Lack of Transportation (Medical): No    Lack of Transportation (Non-Medical): No  Physical Activity: Not on file  Stress: No Stress Concern Present (04/16/2022)   Received from [facility] - Occupational Stress Questionnaire    Feeling of Stress : Only a little  Social Connections: Not on file  Intimate Partner Violence: Not At Risk (10/20/2022)   Humiliation, Afraid, Rape, and Kick questionnaire    Fear of Current or Ex-Partner: No    Emotionally Abused: No    Physically Abused: No    Sexually Abused: No    Past Surgical History:  Procedure Laterality Date   AV FISTULA PLACEMENT Left 08/26/2023   Procedure: ARTERIOVENOUS (AV) FISTULA CREATION;  Surgeon: Jama Cordella MATSU, MD;  Location: [facility] ORS;  Service: Vascular;  Laterality: Left;  BRACHIOCEPHALIC   DIALYSIS/PERMA CATHETER INSERTION N/A 07/10/2023   Procedure: DIALYSIS/PERMA CATHETER INSERTION;  Surgeon: Jama Cordella MATSU, MD;  Location: [facility] INVASIVE CV LAB;  Service: Cardiovascular;  Laterality: N/A;   TUBAL LIGATION      Family History  Problem Relation Age of Onset   Anemia Mother    Colon cancer Neg Hx    Esophageal cancer Neg Hx     Allergies  Allergen Reactions   Lisinopril Swelling       Latest Ref Rng & Units 09/30/2023    9:43 AM 09/16/2023    9:55 AM 09/08/2023    6:05 AM  CBC  WBC 4.0 - 10.5 K/uL 5.2  5.1    Hemoglobin 12.0 - 15.0 g/dL 8.9  8.6  8.0   Hematocrit 36.0 - 46.0 % 27.1  26.4    Platelets 150 - 400 K/uL 207  273        CMP     Component Value Date/Time   NA 134 (L) 08/26/2023 0646   NA 135  (L) 04/25/2013 0937   K 4.1 08/26/2023 0646   K 4.0  04/25/2013 0937   CL 95 (L) 08/26/2023 0646   CL 101 04/25/2013 0937   CO2 22 08/19/2023 1319   CO2 29 04/25/2013 0937   GLUCOSE 93 08/26/2023 0646   GLUCOSE 93 04/25/2013 0937   BUN 11 08/26/2023 0646   BUN 8 04/25/2013 0937   CREATININE 4.00 (H) 08/26/2023 0646   CREATININE 6.21 (H) 11/18/2022 0846   CREATININE 1.05 04/25/2013 0937   CALCIUM  8.6 (L) 08/19/2023 1319   CALCIUM  8.4 (L) 04/25/2013 0937   PROT 7.8 06/26/2023 1356   PROT 8.1 04/25/2013 0937   ALBUMIN  4.0 06/26/2023 1356   ALBUMIN  4.2 04/25/2013 0937   AST 39 06/26/2023 1356   AST 111 (H) 04/25/2013 0937   ALT 23 06/26/2023 1356   ALT 84 (H) 04/25/2013 0937   ALKPHOS 102 06/26/2023 1356   ALKPHOS 70 04/25/2013 0937   BILITOT 0.5 06/26/2023 1356   BILITOT 0.7 04/25/2013 0937   GFRNONAA 6 (L) 08/19/2023 1319   GFRNONAA 7 (L) 11/18/2022 0846   GFRNONAA >60 04/25/2013 0937     No results found.     Assessment & Plan:   1. S/P arteriovenous (AV) fistula creation (Primary) Patient has left upper AV fistula is well-healed today.  Positive thrill and bruit throughout the entire AV fistula on palpation.  Per vascular surgery is okay to use for outpatient hemodialysis.  Once AV fistula is working well patient may have right chest dialysis permacatheter access removed.  Patient underwent HDA Ultrasound today. Access is widely patent.   DIALYSIS ACCESS   Patient Name:  Megan Lambert  Date of Exam:   09/16/2023  Medical Rec #: [id number]            Accession #:    [id number]  Date of Birth: [birth year]            Patient Gender: F  Patient Age:   25 years  Exam Location:  [facility]  Procedure:      VAS US  DUPLEX DIALYSIS ACCESS (AVF, AVG)  Referring Phys: CORDELLA SHAWL    ---------------------------------------------------------------------------  -----    Access Site: Left Upper Extremity.   Access Type: Brachial-cephalic AVF.    History: 08/26/2023: Lt Brachial Cephalic AVF created.   Performing Technologist: Leafy Gibes RVS     Examination Guidelines: A complete evaluation includes B-mode imaging,  spectral  Doppler, color Doppler, and power Doppler as needed of all accessible  portions  of each vessel. Unilateral testing is considered an integral part of a  complete  examination. Limited examinations for reoccurring indications may be  performed  as noted.     Findings:  +--------------------+----------+-----------------+--------+  AVF                PSV (cm/s)Flow Vol (mL/min)Comments  +--------------------+----------+-----------------+--------+  Native artery inflow   182          1208                 +--------------------+----------+-----------------+--------+  AVF Anastomosis        336                               +--------------------+----------+-----------------+--------+     +---------------+----------+-------------+----------+--------+  OUTFLOW VEIN   PSV (cm/s)Diameter (cm)Depth (cm)Describe  +---------------+----------+-------------+----------+--------+  Subclavian vein   403                                     +---------------+----------+-------------+----------+--------+  Confluence       268                                     +---------------+----------+-------------+----------+--------+  Shoulder         137                                     +---------------+----------+-------------+----------+--------+  Prox UA           123                                     +---------------+----------+-------------+----------+--------+  Mid UA            313                                     +---------------+----------+-------------+----------+--------+  Dist UA           413                                     +---------------+----------+-------------+----------+--------+       +--------------+-------------+---------+---------+---------+---------------  ----+               Diameter (cm)  Depth  Branching   PSV       Flow  Volume                                   (cm)             (cm/s)       (ml/min)         +--------------+-------------+---------+---------+---------+---------------  ----+  Lt Rad Art                                      34                          Dist                                                                        +--------------+-------------+---------+---------+---------+---------------  ----+     Summary:  Initial Imaging post Left Brachial Cephalic AVF creation; Patent  throughout.    2. Hypertension, unspecified type Patient was taken off her amlodipine  blood pressure medication due to hypotension and the inability to receive hemodialysis.  Patient was seen by nephrology that asked her to continually take her blood pressure at home on a regular basis and record.  I encouraged patient to follow-up with nephrology or primary care physician for any repeated hypotension and inability to receive hemodialysis. Continue antihypertensive medications as already ordered, these medications have been reviewed and there are no changes at this  time.  3. ESRD (end stage renal disease) (HCC) Patient remains with dialysis permacatheter right chest.  Once newly created left upper extremity AV fistula used successfully for hemodialysis dialysis permacatheter can be removed.  Vascular surgery will be contacted by the patient's outpatient dialysis center at that time.   Current Outpatient Medications on File Prior to Visit  Medication Sig Dispense Refill   Darbepoetin Alfa  (ARANESP ) 200 MCG/0.4ML SOSY injection Inject 0.4 mLs (200 mcg total) into the skin every 28 (twenty-eight) days. Inject 200 mcg aranesp  syringe every 4 weeks . Hold if Hgb greater then 10. 4.8 mL 0   ergocalciferol (VITAMIN D2) 1.25 MG (50000 UT)  capsule Take 50,000 Units by mouth every Monday.     HYDROcodone -acetaminophen  (NORCO/VICODIN) 5-325 MG tablet Take 1 tablet by mouth every 6 (six) hours as needed for moderate pain (pain score 4-6) or severe pain (pain score 7-10). 30 tablet 0   rosuvastatin (CRESTOR) 5 MG tablet Take 5 mg by mouth in the morning.     sevelamer carbonate (RENVELA) 800 MG tablet Take 800 mg by mouth daily with breakfast.     amLODipine  (NORVASC ) 10 MG tablet Take 10 mg by mouth in the morning. (Patient not taking: Reported on 11/16/2023)     No current facility-administered medications on file prior to visit.    There are no Patient Instructions on file for this visit. No follow-ups on file.   Gwendlyn JONELLE Shank, NP

## 2023-11-17 NOTE — Telephone Encounter (Signed)
 Ms Rosner called stating she needs a letter from Dr Keven stating she is not going back to work, forward message to Dr Keven. Per Dr Keven she cannot write a letter taking Ms Ey out of work it has to be formal documentation from [employer] or short term disability. Verbalized understanding.  Leighton Dage, CMA

## 2023-11-17 NOTE — Telephone Encounter (Signed)
 Copied from CRM #[id number]. Topic: Access To Clinicians - Req Clinic Call Back >> Nov 17, 2023  2:16 PM Rolin R wrote: Reason of the Call: Note for work  Requesting: a call back from PCP  Supporting details: Needing a note for work but asking to speak to PCP to let her know what needs to be on letter.  Appointment: They declined to schedule an appointment.   Does the caller want to be contacted regarding this request? Yes. Please contact The patient by Cell Phone Telephone Information: Mobile          [phone] Mobile          [phone]   Routine callback turnaround time: 24-48 business hours. Programmer, systems Notified)

## 2023-11-17 NOTE — Telephone Encounter (Signed)
 Returned call regarding work note left message to return my call  Leighton Dage, CMA

## 2023-11-18 ENCOUNTER — Encounter (HOSPITAL_COMMUNITY): Payer: Self-pay | Admitting: Gastroenterology

## 2023-11-18 NOTE — Progress Notes (Signed)
 Attempted to obtain medical history via telephone, unable to reach at this time. Unable to leave voicemail to return pre surgical testing department's phone call,due to mailbox not set up.

## 2023-11-24 ENCOUNTER — Ambulatory Visit (HOSPITAL_COMMUNITY): Admission: RE | Admit: 2023-11-24 | Source: Home / Self Care | Admitting: Gastroenterology

## 2023-11-24 SURGERY — COLONOSCOPY
Anesthesia: Monitor Anesthesia Care

## 2023-11-24 NOTE — Progress Notes (Signed)
 Patient was supposed to be here at 1430 for procedure. We have been calling her several times on all the numbers we have . There is no voice mail and no one has answered. We are cancelling this patient.

## 2023-11-26 NOTE — Progress Notes (Signed)
 What are the next steps you would like me to take? Please advise.

## 2023-12-04 ENCOUNTER — Encounter: Payer: Self-pay | Admitting: Internal Medicine

## 2024-01-11 ENCOUNTER — Encounter: Payer: Self-pay | Admitting: Internal Medicine

## 2024-01-11 ENCOUNTER — Emergency Department
Admission: EM | Admit: 2024-01-11 | Discharge: 2024-01-11 | Disposition: A | Discharge status: Home / Self Care | Payer: Self-pay | Attending: Emergency Medicine | Admitting: Emergency Medicine

## 2024-01-11 ENCOUNTER — Other Ambulatory Visit: Payer: Self-pay

## 2024-01-11 ENCOUNTER — Emergency Department: Payer: Self-pay

## 2024-01-11 DIAGNOSIS — N186 End stage renal disease: Secondary | ICD-10-CM

## 2024-01-11 LAB — CBC WITH DIFFERENTIAL/PLATELET
Abs Immature Granulocytes: 0.03 K/uL (ref 0.00–0.07)
Basophils Absolute: 0.1 K/uL (ref 0.0–0.1)
Basophils Relative: 2 %
Eosinophils Absolute: 0.1 K/uL (ref 0.0–0.5)
Eosinophils Relative: 2 %
HCT: 25.6 % — ABNORMAL LOW (ref 36.0–46.0)
Hemoglobin: 8.4 g/dL — ABNORMAL LOW (ref 12.0–15.0)
Immature Granulocytes: 1 %
Lymphocytes Relative: 24 %
Lymphs Abs: 1.1 K/uL (ref 0.7–4.0)
MCH: 30.2 pg (ref 26.0–34.0)
MCHC: 32.8 g/dL (ref 30.0–36.0)
MCV: 92.1 fL (ref 80.0–100.0)
Monocytes Absolute: 0.5 K/uL (ref 0.1–1.0)
Monocytes Relative: 10 %
Neutro Abs: 3 K/uL (ref 1.7–7.7)
Neutrophils Relative %: 61 %
Platelets: 210 K/uL (ref 150–400)
RBC: 2.78 MIL/uL — ABNORMAL LOW (ref 3.87–5.11)
RDW: 19.4 % — ABNORMAL HIGH (ref 11.5–15.5)
WBC: 4.8 K/uL (ref 4.0–10.5)
nRBC: 0 % (ref 0.0–0.2)

## 2024-01-11 LAB — BASIC METABOLIC PANEL WITH GFR
Anion gap: 24 — ABNORMAL HIGH (ref 5–15)
BUN: 56 mg/dL — ABNORMAL HIGH (ref 6–20)
CO2: 19 mmol/L — ABNORMAL LOW (ref 22–32)
Calcium: 9.5 mg/dL (ref 8.9–10.3)
Chloride: 93 mmol/L — ABNORMAL LOW (ref 98–111)
Creatinine, Ser: 11.8 mg/dL — ABNORMAL HIGH (ref 0.44–1.00)
GFR, Estimated: 3 mL/min — ABNORMAL LOW (ref >60)
Glucose, Bld: 89 mg/dL (ref 70–99)
Potassium: 4.5 mmol/L (ref 3.5–5.1)
Sodium: 135 mmol/L (ref 135–145)

## 2024-01-11 LAB — MAGNESIUM: Magnesium: 1.7 mg/dL (ref 1.7–2.4)

## 2024-01-11 LAB — PHOSPHORUS: Phosphorus: 7.1 mg/dL — ABNORMAL HIGH (ref 2.5–4.6)

## 2024-01-11 LAB — HEPATITIS B SURFACE ANTIGEN: Hepatitis B Surface Ag: NONREACTIVE

## 2024-01-11 MED ORDER — DOXYCYCLINE HYCLATE 100 MG PO TABS: 100.0000 mg | ORAL_TABLET | Freq: Two times a day (BID) | ORAL | 0 refills | Status: AC

## 2024-01-11 MED ORDER — HEPARIN SODIUM (PORCINE) 1000 UNIT/ML IJ SOLN
INTRAMUSCULAR | Status: AC
  Filled 2024-01-11: qty 5

## 2024-01-11 MED ORDER — CHLORHEXIDINE GLUCONATE CLOTH 2 % EX PADS
6.0000 | MEDICATED_PAD | Freq: Every day | CUTANEOUS | Status: DC
  Filled 2024-01-11: qty 6

## 2024-01-11 NOTE — ED Notes (Signed)
 Report given to [facility]

## 2024-01-11 NOTE — Progress Notes (Signed)
   01/11/24 1702  Vitals  Temp 97.6 F (36.4 C)  BP (!) 150/87  MAP (mmHg) 110  Pulse Rate (!) 106  ECG Heart Rate (!) 104  Resp 19  Oxygen Therapy  SpO2 100 %  O2 Device Room Air  During Treatment Monitoring  Blood Flow Rate (mL/min) 0 mL/min  Arterial Pressure (mmHg) -46.86 mmHg  Venous Pressure (mmHg) 32.52 mmHg  TMP (mmHg) 7.07 mmHg  Ultrafiltration Rate (mL/min) 852 mL/min  Dialysate Flow Rate (mL/min) 299 ml/min  Duration of HD Treatment -hour(s) 3 hour(s)  Cumulative Fluid Removed (mL) per Treatment  2000.2  Post Treatment  Dialyzer Clearance Clear  Hemodialysis Intake (mL) 0 mL  Liters Processed 72  Fluid Removed (mL) 2000 mL  Tolerated HD Treatment Yes  Post-Hemodialysis Comments Goal has been met. Tx has been tolerated. VS are stable. No verbalized concerns. Patient is stable for dishcarge.

## 2024-01-11 NOTE — ED Provider Notes (Addendum)
 [facility] Provider Note    Event Date/Time   First MD Initiated Contact with Patient 01/11/24 [phone]     (approximate)   History   Abnormal labs   HPI  Megan Lambert is a 53 y.o. female on dialysis Monday Wednesday Friday who did not have treatment at all last week due to being sick with the flu.  Dialysis center told him she to come here for evaluation given she is missed too many days of dialysis due to abnormal lab work.  I reviewed the notes were on 01/07/2024 patient had blood work done that was actually pretty reassuring.  She denies any symptoms.  She denies any new swelling in her legs, shortness of breath, chest pain, nausea, vomiting, abdominal pain.  I did address that her heart rate was slightly elevated and she reports that is chronic for her.  She reports being on metoprolol for it.  I reviewed a note where patient was seen on 12/21/2023 and they did comment on sinus tachycardia in the setting of anemia and ESRD that had improved with metoprolol.  She denies any new symptoms in regards to this.   Physical Exam   Triage Vital Signs: ED Triage Vitals  Encounter Vitals Group     BP 01/11/24 0809 (!) 150/91     Girls Systolic BP Percentile --      Girls Diastolic BP Percentile --      Boys Systolic BP Percentile --      Boys Diastolic BP Percentile --      Pulse Rate 01/11/24 0809 (!) 110     Resp 01/11/24 0809 19     Temp 01/11/24 0809 98 F (36.7 C)     Temp src --      SpO2 01/11/24 0809 100 %     Weight 01/11/24 0807 181 lb (82.1 kg)     Height 01/11/24 0807 6' (1.829 m)     Head Circumference --      Peak Flow --      Pain Score 01/11/24 0807 0     Pain Loc --      Pain Education --      Exclude from Growth Chart --     Most recent vital signs: Vitals:   01/11/24 0809  BP: (!) 150/91  Pulse: (!) 110  Resp: 19  Temp: 98 F (36.7 C)  SpO2: 100%     General: Awake, no distress.  CV:  Good peripheral perfusion.   Tachycardic Resp:  Normal effort.  Abd:  No distention.  Soft and nontender Other:  Fistula noted in arm.  Some edema noted in bilateral legs but she reports this is baseline for her.  No calf tenderness.   ED Results / Procedures / Treatments   Labs (all labs ordered are listed, but only abnormal results are displayed) Labs Reviewed  CBC WITH DIFFERENTIAL/PLATELET  BASIC METABOLIC PANEL WITH GFR  MAGNESIUM   PHOSPHORUS     EKG  My interpretation of EKG:  Sinus tachy rate of 107, no st elevation, no twi, normal intervals   RADIOLOGY    PROCEDURES:  Critical Care performed: No  .1-3 Lead EKG Interpretation  Performed by: Ernest Ronal BRAVO, MD Authorized by: Ernest Ronal BRAVO, MD     Interpretation: abnormal     ECG rate:  110   ECG rate assessment: tachycardic     Rhythm: sinus tachycardia     Ectopy: none     Conduction: normal  MEDICATIONS ORDERED IN ED: Medications - No data to display   IMPRESSION / MDM / ASSESSMENT AND PLAN / ED COURSE  I reviewed the triage vital signs and the nursing notes.   Patient's presentation is most consistent with acute presentation with potential threat to life or bodily function.   Patient comes in with concerns for missed dialysis differential includes Electra abnormality such as hyperkalemia, elevated BUN, fluid overload.  The patient denies any symptoms in regards to it is an overall very well-appearing.  She is slightly tachycardic but she denies any symptoms related to this and she reports this is baseline for her.  She is on metoprolol and I did find prior notes where did comment on this at baseline I do suspect she might be a little bit more tachycardic today to secondary to extra fluid on her given a week of missed dialysis but she does report that she still makes some urine.  Will get labs to evaluate for need for emergent dialysis and then discussed with nephrology.  Phosphorus is slightly elevated CBC shows hemoglobin  that is around baseline.  BMP shows elevated BUN of 56 and elevated anion gap of 24.  Magnesium  is reassuring.  Discussed case with Dr. Dennise and they are working on trying to get her dialysis from here.  Patient will go to dialysis and come back and be able to be discharged.  DR frank so they would not do patient outpatient and she need to be done in the hospital due to her history of tachyarrhythmias and having missed dialysis for over a week with concerns for extra fluid on her.  1:16 PM nephrology did want me to add on a course of azithromycin  for patient given she reported cough.  Will get a chest x-ray to make sure no large pneumonia and I will start patient on some doxycycline  per nephrology's recommendation as this will be better to treat a post flu pneumonia.  The patient is on the cardiac monitor to evaluate for evidence of arrhythmia and/or significant heart rate changes.      FINAL CLINICAL IMPRESSION(S) / ED DIAGNOSES   Final diagnoses:  ESRD (end stage renal disease) on dialysis [facility])     Rx / DC Orders   ED Discharge Orders     None        Note:  This document was prepared using Dragon voice recognition software and may include unintentional dictation errors.   Ernest Ronal BRAVO, MD 01/11/24 9065    Ernest Ronal BRAVO, MD 01/11/24 9050    Ernest Ronal BRAVO, MD 01/11/24 [phone]

## 2024-01-11 NOTE — ED Triage Notes (Addendum)
 Pt comes with needing dialysis. Pt had labs done recently and some were abnormal. Pt does dialysis MWF. Pt did not have treatment at all last week. Pt had been sick with flu and missed her days. The dialysis center told her she has to come here since she missed so many days.

## 2024-01-11 NOTE — Discharge Instructions (Signed)
 You are seen in the emergency department and needed emergent dialysis.  There was concern that you could have an underlying pneumonia and you were started on an antibiotic called doxycycline .  Take as prescribed and follow-up closely with your primary care physician.  Return to the emergency department for any worsening symptoms.  Doxycycline  - This medication can cause acid reflux.  It is important that you take it with food and drink plenty of water.  Do not lie down for 1 hour after taking this medication.  It also causes sun sensitivity so stay out of the sun or wear SPF while on this medication.

## 2024-01-11 NOTE — ED Provider Notes (Signed)
 Care assumed of patient from outgoing provider.  See their note for initial history, exam and plan.   Patient returned back from dialysis.  States that she is feeling significantly better and ready to go home.  Some underlying concern for possible pneumonia so was started on doxycycline .  Patient states that she will follow-up closely with her primary care physician and return to the emergency department for any worsening symptoms.   Suzanne Kirsch, MD 01/11/24 [phone]

## 2024-01-11 NOTE — ED Notes (Signed)
 Pt requesting food/drink. Renal diet order placed, verbal Dr Ernest

## 2024-01-11 NOTE — ED Notes (Signed)
 Pt placed on monitor and EKG performed. EKG shown to MD [facility]. RN Bri provided with triage info.

## 2024-01-11 NOTE — Progress Notes (Signed)
 [facility]  ROUNDING NOTE   Subjective:   Megan Lambert is a 53 year old female with past medical history of hypertension and end stage renal disease on hemodialysis. She presents to ED due to missed dialysis treatments.   Patient is known to our practice from previous admission and receives outpatient dialysis treatments at [facility] on a MWF schedule, supervised by [facility] physicians. She states she has missed the last four treatments due to having the flu. She states she attempted to go last Wednesday but was turned away. She went today and was told to come to ED for evaluation. Reports mild shortness of breath, currently on room air. Labs stable. Mild tachycardia on monitor. Outpatient clinic voices concerns of arrhythmias.   We have been consulted to perform dialysis.    Objective:  Vital signs in last 24 hours:  Temp:  [98 F (36.7 C)-98.6 F (37 C)] 98.6 F (37 C) (12/08 1308) Pulse Rate:  [94-111] 99 (12/08 1500) Resp:  [15-21] 15 (12/08 1500) BP: (138-173)/(87-106) 166/106 (12/08 1500) SpO2:  [100 %] 100 % (12/08 1500) Weight:  [82.1 kg-84.6 kg] 84.6 kg (12/08 1330)  Weight change:  Filed Weights   01/11/24 0807 01/11/24 1330  Weight: 82.1 kg 84.6 kg    Intake/Output: No intake/output data recorded.   Intake/Output this shift:  No intake/output data recorded.  Physical Exam: General: NAD  Head: Normocephalic, atraumatic. Moist oral mucosal membranes  Eyes: Anicteric  Neck: Supple  Lungs:  Clear to auscultation, room air  Heart: Regular rhythm, tachycardia  Abdomen:  Soft, nontender  Extremities:  Trace peripheral edema.  Neurologic: Awake, alert, conversant  Skin: Warm,dry, no rash  Access: Rt internal jugular permcath    Basic Metabolic Panel: Recent Labs  Lab 01/11/24 0827  NA 135  K 4.5  CL 93*  CO2 19*  GLUCOSE 89  BUN 56*  CREATININE 11.80*  CALCIUM  9.5  MG 1.7  PHOS 7.1*    Liver Function Tests: No results for  input(s): AST, ALT, ALKPHOS, BILITOT, PROT, ALBUMIN  in the last 168 hours. No results for input(s): LIPASE, AMYLASE in the last 168 hours. No results for input(s): AMMONIA in the last 168 hours.  CBC: Recent Labs  Lab 01/11/24 0827  WBC 4.8  NEUTROABS 3.0  HGB 8.4*  HCT 25.6*  MCV 92.1  PLT 210    Cardiac Enzymes: No results for input(s): CKTOTAL, CKMB, CKMBINDEX, TROPONINI in the last 168 hours.  BNP: Invalid input(s): POCBNP  CBG: No results for input(s): GLUCAP in the last 168 hours.  Microbiology: Results for orders placed or performed during the hospital encounter of 08/19/23  Surgical pcr screen     Status: None   Collection Time: 08/19/23  1:19 PM   Specimen: Nasal Mucosa; Nasal Swab  Result Value Ref Range Status   MRSA, PCR NEGATIVE NEGATIVE Final   Staphylococcus aureus NEGATIVE NEGATIVE Final    Comment: (NOTE) The Xpert SA Assay (FDA approved for NASAL specimens in patients 67 years of age and older), is one component of a comprehensive surveillance program. It is not intended to diagnose infection nor to guide or monitor treatment. Performed at [facility], [address] [place], [place] [address]     Coagulation Studies: No results for input(s): LABPROT, INR in the last 72 hours.  Urinalysis: No results for input(s): COLORURINE, LABSPEC, PHURINE, GLUCOSEU, HGBUR, BILIRUBINUR, KETONESUR, PROTEINUR, UROBILINOGEN, NITRITE, LEUKOCYTESUR in the last 72 hours.  Invalid input(s): APPERANCEUR  Imaging: DG Chest 2 View Result Date: 01/11/2024 CLINICAL DATA:  Cough. EXAM: CHEST - 2 VIEW COMPARISON:  05/09/2023. FINDINGS: Trachea is midline. Heart size within normal limits. Right IJ dialysis catheter tips are in the SVC and right atrium. Lungs are clear. No pleural fluid. Chronically elevated left hemidiaphragm. IMPRESSION: No acute findings. Electronically Signed   By: Newell Eke  M.D.   On: 01/11/2024 13:53     Medications:     Chlorhexidine  Gluconate Cloth  6 each Topical Q0600     Assessment/ Plan:  [name] is a 53 y.o.  female with past medical history of hypertension and end stage renal disease on hemodialysis. She presents to ED due to missed dialysis treatments.    Acute metabolic acidosis, likely secondary to missed dialysis. S bicarb 19, Will slowly correct with dialysis.   End stage renal disease on hemodialysis Missed last 4 outpatient treatments. Will perform dialysis today, UF 2L as tolerated. Next treatment scheduled for Wednesday.    Lab Results  Component Value Date   CREATININE 11.80 (H) 01/11/2024   CREATININE 4.00 (H) 08/26/2023   CREATININE 7.11 (H) 08/19/2023   No intake or output data in the 24 hours ending 01/11/24 1609  3. Secondary Hyperparathyroidism: with outpatient labs: none available Lab Results  Component Value Date   CALCIUM  9.5 01/11/2024   CAION 1.12 (L) 08/26/2023   PHOS 7.1 (H) 01/11/2024  Will continue to monitor bone minerals.    LOS: 0 Megan Lambert 12/8/20254:09 PM

## 2024-01-11 NOTE — ED Notes (Signed)
Ambulatory to toilet

## 2024-01-12 ENCOUNTER — Encounter: Payer: Self-pay | Admitting: Internal Medicine

## 2024-01-12 LAB — HEPATITIS B SURFACE ANTIBODY, QUANTITATIVE: Hep B S AB Quant (Post): <3.5 m[IU]/mL — ABNORMAL LOW

## 2024-02-15 NOTE — Progress Notes (Signed)
 " Assessment and Plan:     Teneil Shiller is a 54 y.o. female presenting for follow up -she presents with her mother today  -she notes that she is no longer working as she was able to get disability.  (1) Depression -She reported significant depression in November 2025 but denied active/passive SI/HI.  -We started Sertraline in November 2025. She reports compliance to medication but has not provided much benefit  -We will INCREASE Sertraline to 100mg  every day   -     sertraline (ZOLOFT) 100 MG tablet; Take 1 tablet (100 mg total) by mouth daily. -She really would benefit from CBT but has declined referral    (2) ESRD on HD (MWF) -she had an ED visit on 01/11/2024 due to missed dialysis Ishe had missed 4 treatments as she had the flu)   (2) hypotension, SVT, Tachycardia -working closely with cardiology and nephrology -However she continues to have hypotensive episodes during dialysis and due to this, treatments have been paused -She does experience symptoms with her hypotension  -Discussed with Cardiology and Nephrology and relayed the following plan to patient:   -DECREASE Metoprolol Succinate from 100mg  to 50mg  every day to see if this helps  -If this does not help, she may need Midodrine during dialysis   -The 10-year ASCVD risk score (Arnett DK, et al., 2019) is: 6% -she should continue to take Rosuvastatin 5mg  every day    (3) Anemia -She reported that her Hgb at dialysis was 7.0.  -She does appear to symptomatic anemia and I advised that she go to the ER for a transfusion but she has refused again to go.  -we have discussed concern for her anemia at our visit but she continues to refuse to go to the ER. She verbalized understanding to risks of worsening symptoms, hypotension and/or death.  -Urgent referral place to [facility] with [facility] as she is need of a transfusion and continued evaluation for her anemia -     Ambulatory referral to Hematology; Future    (4)  GERD -she reports taking her mothers protonix  for her reflus and is requesting a prescription for this medication  -we did discuss concern of PPI and renal disease which she verbalized understanding and agreement too. However she would like a prescription for PPI given this does provide her with significant symptom relief -Prescription provided  -     pantoprazole  (PROTONIX ) 20 MG tablet; Take 1 tablet (20 mg total) by mouth daily.    (5) Granuloma of liver, Dermoid Cyst of Left Ovary  -these were incidental findings on CT Abdomen/Pelvis from 07/13/2023 -It was recommend for her to undergo an abdomen/pelvis MRI. However she has deferred further imaging  -E-consult to GI for Hepatic Lobe Granuloma/Splenic Granuloma was obtained on 04/29/2022 -She was referred to Gynecology but has not followed up    (6) Renal Cyst -she was referred to Urology, last evaluated on 09/14/2023 where it was noted to return to clinic with repeat CT Abdomen to re-assess cysts   HEALTH MAINTENANCE -Pap Smear on 07/03/23: NIL, HPV negative--->  Repeat in 06/2027 -DEXA (07/13/2023): NORMAL BONE MINERAL DENSITY --> will likely repeat in 07/2025 given renal disease -Mammogram was ordered --> still needs to be obtained  -Immunizations --> she has DECLINED -CRC screening --> Colonoscopy and Endoscopy scheduled with Dr. Aundria on 03/08/2024   Patient was advised to go to the emergency room but refused. Return in about 5 weeks (around 03/21/2024) for Recheck medications .  Subjective:   Cricket Dama Ada 53 y.o.female   is being seen for medication management  Chief Complaint  Patient presents with   Follow-up   She did not go back to work as they approved for debility   She notes that the sertraline has provided some benefit but not a lot  She does get symptomatic at dialysis when she is hypotension   She does have bad reflux and has taken her mothers protonix . She notes that this has helped her GERD and would like  a prescription.   Denies hematuria or hematuria   However notes that her HgB was 7.0 today  She does feel bad at this time   She does not want to go to the ER because she went to the ER on 01/11/24 for dialysis and was there for >8 hours.    Of note, received the following messages today:  Jama Old, MD  Glyn Greaves, Lazarina D, FNP Cc: Teresia Space, MD; Keven, Benison Pap, DO May be worth a trial of reducing metoprolol back to 50 mg daily, or if she is taking it nightly 100 mg on dialysis evenings and 50 mg the evening before dialysis to allow for a little bit more blood pressure room.  I am not opposed to midodrine, but probably worth decreasing the metoprolol first.     ----- Message ----- From: Glyn Greaves, Beryle BIRCH, FNP Sent: 02/15/2024   9:26 AM EST To: Space Teresia, MD; Old Jama, MD; Benison* Subject: Hypotension during dialysis                    Good Morning Dr Jama,   After recent increased of Metoprolol from 50 to 100 mg nightly, Ms Kossman has been experiencing intermittent hypotension on dialysis. Due to these soft pressures, we are unable to remove fluid gains. B/P during rounds today of 87/60 HR 103, we paused the treatment, and we have had to pause treatments each time for the past week. She is currently 2 kg over her dry weight and we have been able to remove just 770 mls. Would it be reasonable to use Midodrine mid treatment each time? Please let me know your thoughts and recommendations. Thank you.   Lazarina Baez Marinez, FNP [facility] Division of Nephrology and Hypertension  [facility]   P.S. Ms Blatchley is scheduled for a colonoscopy consult at [facility] on 2/3.   No LMP recorded. Patient is postmenopausal.    ROS:   ROS negative unless otherwise noted in HPI  Vital Signs:   Wt Readings from Last 3 Encounters:  02/15/24 81.6 kg (180 lb)  12/21/23 81.6 kg (180 lb)  12/01/23 81.6 kg (180 lb)   Temp Readings from Last 3  Encounters:  09/28/23 37.1 C (98.7 F)  09/18/23 36.2 C (97.2 F)  09/14/23 36.4 C (97.5 F) (Temporal)   BP Readings from Last 3 Encounters:  02/15/24 130/89  12/21/23 135/83  12/01/23 110/76   Pulse Readings from Last 3 Encounters:  02/15/24 102  12/21/23 120  12/01/23 95    Objective:    Physical Exam Constitutional:      General: She is not in acute distress.    Appearance: She is not toxic-appearing or diaphoretic.  HENT:     Head: Normocephalic and atraumatic.     Mouth/Throat:     Mouth: Mucous membranes are moist.  Eyes:     General: No scleral icterus.       Right eye: No  discharge.        Left eye: No discharge.     Extraocular Movements: Extraocular movements intact.  Cardiovascular:     Rate and Rhythm: Tachycardia present.  Pulmonary:     Effort: No respiratory distress.     Breath sounds: Normal breath sounds.  Musculoskeletal:        General: Normal range of motion.     Right lower leg: Edema present.     Left lower leg: Edema present.  Neurological:     General: No focal deficit present.     Mental Status: She is alert and oriented to person, place, and time.     Cranial Nerves: No cranial nerve deficit.  Psychiatric:        Mood and Affect: Mood normal.        Behavior: Behavior normal.      "

## 2024-02-16 ENCOUNTER — Telehealth: Payer: Self-pay | Admitting: Internal Medicine

## 2024-02-16 NOTE — Telephone Encounter (Signed)
 Patient calling and wants to schedule a blood transfusion. she states her hemoglobin is down to 7. Please advise on scheduling.

## 2024-02-17 ENCOUNTER — Other Ambulatory Visit: Payer: Self-pay | Admitting: *Deleted

## 2024-02-17 ENCOUNTER — Inpatient Hospital Stay: Attending: Internal Medicine

## 2024-02-17 DIAGNOSIS — D509 Iron deficiency anemia, unspecified: Secondary | ICD-10-CM | POA: Diagnosis present

## 2024-02-17 DIAGNOSIS — N186 End stage renal disease: Secondary | ICD-10-CM | POA: Diagnosis present

## 2024-02-17 DIAGNOSIS — D649 Anemia, unspecified: Secondary | ICD-10-CM

## 2024-02-17 DIAGNOSIS — D631 Anemia in chronic kidney disease: Secondary | ICD-10-CM | POA: Diagnosis not present

## 2024-02-17 DIAGNOSIS — Z992 Dependence on renal dialysis: Secondary | ICD-10-CM | POA: Diagnosis not present

## 2024-02-17 DIAGNOSIS — I120 Hypertensive chronic kidney disease with stage 5 chronic kidney disease or end stage renal disease: Secondary | ICD-10-CM | POA: Insufficient documentation

## 2024-02-17 LAB — CBC WITH DIFFERENTIAL (CANCER CENTER ONLY)
Abs Immature Granulocytes: 0.06 K/uL (ref 0.00–0.07)
Basophils Absolute: 0 K/uL (ref 0.0–0.1)
Basophils Relative: 1 %
Eosinophils Absolute: 0.1 K/uL (ref 0.0–0.5)
Eosinophils Relative: 2 %
HCT: 21.7 % — ABNORMAL LOW (ref 36.0–46.0)
Hemoglobin: 7.1 g/dL — ABNORMAL LOW (ref 12.0–15.0)
Immature Granulocytes: 1 %
Lymphocytes Relative: 32 %
Lymphs Abs: 1.9 K/uL (ref 0.7–4.0)
MCH: 29.7 pg (ref 26.0–34.0)
MCHC: 32.7 g/dL (ref 30.0–36.0)
MCV: 90.8 fL (ref 80.0–100.0)
Monocytes Absolute: 0.6 K/uL (ref 0.1–1.0)
Monocytes Relative: 10 %
Neutro Abs: 3.2 K/uL (ref 1.7–7.7)
Neutrophils Relative %: 54 %
Platelet Count: 155 K/uL (ref 150–400)
RBC: 2.39 MIL/uL — ABNORMAL LOW (ref 3.87–5.11)
RDW: 18 % — ABNORMAL HIGH (ref 11.5–15.5)
WBC Count: 5.9 K/uL (ref 4.0–10.5)
nRBC: 0.3 % — ABNORMAL HIGH (ref 0.0–0.2)

## 2024-02-17 LAB — PREPARE RBC (CROSSMATCH)

## 2024-02-18 ENCOUNTER — Encounter: Payer: Self-pay | Admitting: Nurse Practitioner

## 2024-02-18 ENCOUNTER — Inpatient Hospital Stay: Admitting: Nurse Practitioner

## 2024-02-18 ENCOUNTER — Encounter: Payer: Self-pay | Admitting: Internal Medicine

## 2024-02-18 ENCOUNTER — Inpatient Hospital Stay

## 2024-02-18 VITALS — BP 113/73 | HR 92 | Temp 98.5°F | Resp 16 | Wt 181.0 lb

## 2024-02-18 DIAGNOSIS — D631 Anemia in chronic kidney disease: Secondary | ICD-10-CM | POA: Diagnosis not present

## 2024-02-18 DIAGNOSIS — Z992 Dependence on renal dialysis: Secondary | ICD-10-CM

## 2024-02-18 DIAGNOSIS — N186 End stage renal disease: Secondary | ICD-10-CM | POA: Diagnosis not present

## 2024-02-18 DIAGNOSIS — D509 Iron deficiency anemia, unspecified: Secondary | ICD-10-CM | POA: Diagnosis not present

## 2024-02-18 DIAGNOSIS — D649 Anemia, unspecified: Secondary | ICD-10-CM

## 2024-02-18 MED ORDER — DIPHENHYDRAMINE HCL 25 MG PO TABS
25.0000 mg | ORAL_TABLET | Freq: Once | ORAL | Status: AC
  Administered 2024-02-18: 25 mg via ORAL
  Filled 2024-02-18: qty 1

## 2024-02-18 MED ORDER — SODIUM CHLORIDE 0.9% IV SOLUTION
250.0000 mL | INTRAVENOUS | Status: DC
  Administered 2024-02-18: 100 mL via INTRAVENOUS
  Filled 2024-02-18: qty 250

## 2024-02-18 MED ORDER — ACETAMINOPHEN 325 MG PO TABS
650.0000 mg | ORAL_TABLET | Freq: Once | ORAL | Status: AC
  Administered 2024-02-18: 650 mg via ORAL
  Filled 2024-02-18: qty 2

## 2024-02-18 NOTE — Progress Notes (Signed)
 "  Symptom Management Clinic [facility] [phone] [phone]  Patient Care Team: Mangel, Benison Pap, MD as PCP - General (Family Medicine) Rennie Cindy SAUNDERS, MD as Consulting Physician (Oncology) Toledo, Teodoro K, MD as Consulting Physician (Gastroenterology)   NAME OF PATIENT: Megan Lambert  [id number]  Jun 16, 1970   DATE OF VISIT: 02/18/24  REASON FOR CONSULT: Megan Lambert is a 54 y.o. female with multiple medical problems including ESRD not yet on dialysis, anemia, and history of noncompliance with treatments here for blood transfusion due to anemia of CKD   INTERVAL HISTORY: [name] is a 54 year old female with end stage renal disease on hemodialysis who presents for management of chronic anemia.  She has a history of chronic anemia and end stage renal disease, and is managed with hemodialysis three times weekly. She has required multiple blood transfusions for symptomatic anemia and is receiving another transfusion today. She has not received iron  infusions or erythropoiesis-stimulating agents recently, and is not taking oral iron  supplementation.  She experiences persistent symptoms of anemia, including fatigue, dyspnea, generalized weakness, and intermittent dizziness, particularly with ambulation or upon standing. She also reports significant hypotensive episodes during dialysis sessions. She denies gastrointestinal or genitourinary bleeding, including hematuria or melena.  Patient reports that in the past she had received iron  and epo injections at dialysis but hasn't had any in a long time.  I have reached out to her Nephrologist Dr. Teresia to inquire about what treatments she is getting through them regarding her anemia.  Iron  stores from Dec doesn't indicate a need for iron  if she has not been getting epo I would start with that as a treatment for anemia to attempt prevent worsening anemia.   Message left with nursing staff requesting a call back with more detail about their treatment plan as it was guide ours here in clinic.   PAST MEDICAL HISTORY: Past Medical History:  Diagnosis Date   Anemia    Asthma    Chronic kidney disease    ESRD on dialysis   Dysrhythmia    Fibroids    Uterine   Hypertension    Pneumonia     PAST SURGICAL HISTORY:  Past Surgical History:  Procedure Laterality Date   AV FISTULA PLACEMENT Left 08/26/2023   Procedure: ARTERIOVENOUS (AV) FISTULA CREATION;  Surgeon: Jama Cordella MATSU, MD;  Location: [facility] ORS;  Service: Vascular;  Laterality: Left;  BRACHIOCEPHALIC   DIALYSIS/PERMA CATHETER INSERTION N/A 07/10/2023   Procedure: DIALYSIS/PERMA CATHETER INSERTION;  Surgeon: Jama Cordella MATSU, MD;  Location: [facility] INVASIVE CV LAB;  Service: Cardiovascular;  Laterality: N/A;   TUBAL LIGATION      HEMATOLOGY/ONCOLOGY HISTORY:  Oncology History   No problem history exists.    ALLERGIES:  is allergic to lisinopril.  MEDICATIONS:  Current Outpatient Medications  Medication Sig Dispense Refill   ergocalciferol (VITAMIN D2) 1.25 MG (50000 UT) capsule Take 50,000 Units by mouth every Monday.     HYDROcodone -acetaminophen  (NORCO/VICODIN) 5-325 MG tablet Take 1 tablet by mouth every 6 (six) hours as needed for moderate pain (pain score 4-6) or severe pain (pain score 7-10). 30 tablet 0   rosuvastatin (CRESTOR) 5 MG tablet Take 5 mg by mouth in the morning.     sevelamer carbonate (RENVELA) 800 MG tablet Take 800 mg by mouth daily with breakfast.     amLODipine  (NORVASC ) 10 MG tablet Take 10 mg by mouth in the morning. (Patient not taking: Reported on 02/18/2024)  No current facility-administered medications for this visit.    VITAL SIGNS: BP 113/73 (BP Location: Right Arm, Patient Position: Sitting, Cuff Size: Normal)   Pulse 92   Temp 98.5 F (36.9 C) (Tympanic)   Resp 16   Wt 181 lb (82.1 kg)   LMP  (LMP Unknown)   SpO2 100%   BMI 24.55  kg/m  Filed Weights   02/18/24 1015  Weight: 181 lb (82.1 kg)    Estimated body mass index is 24.55 kg/m as calculated from the following:   Height as of 01/11/24: 6' (1.829 m).   Weight as of this encounter: 181 lb (82.1 kg).  LABS: CBC:    Component Value Date/Time   WBC 5.9 02/17/2024 1110   WBC 4.8 01/11/2024 0827   HGB 7.1 (L) 02/17/2024 1110   HGB 12.3 04/25/2013 0937   HCT 21.7 (L) 02/17/2024 1110   HCT 37.5 04/25/2013 0937   PLT 155 02/17/2024 1110   PLT 198 04/25/2013 0937   MCV 90.8 02/17/2024 1110   MCV 85 04/25/2013 0937   NEUTROABS 3.2 02/17/2024 1110   NEUTROABS 4.4 04/25/2013 0937   LYMPHSABS 1.9 02/17/2024 1110   LYMPHSABS 1.9 04/25/2013 0937   MONOABS 0.6 02/17/2024 1110   MONOABS 0.5 04/25/2013 0937   EOSABS 0.1 02/17/2024 1110   EOSABS 0.1 04/25/2013 0937   BASOSABS 0.0 02/17/2024 1110   BASOSABS 0.1 04/25/2013 0937   Comprehensive Metabolic Panel:    Component Value Date/Time   NA 135 01/11/2024 0827   NA 135 (L) 04/25/2013 0937   K 4.5 01/11/2024 0827   K 4.0 04/25/2013 0937   CL 93 (L) 01/11/2024 0827   CL 101 04/25/2013 0937   CO2 19 (L) 01/11/2024 0827   CO2 29 04/25/2013 0937   BUN 56 (H) 01/11/2024 0827   BUN 8 04/25/2013 0937   CREATININE 11.80 (H) 01/11/2024 0827   CREATININE 6.21 (H) 11/18/2022 0846   CREATININE 1.05 04/25/2013 0937   GLUCOSE 89 01/11/2024 0827   GLUCOSE 93 04/25/2013 0937   CALCIUM  9.5 01/11/2024 0827   CALCIUM  8.4 (L) 04/25/2013 0937   AST 39 06/26/2023 1356   AST 111 (H) 04/25/2013 0937   ALT 23 06/26/2023 1356   ALT 84 (H) 04/25/2013 0937   ALKPHOS 102 06/26/2023 1356   ALKPHOS 70 04/25/2013 0937   BILITOT 0.5 06/26/2023 1356   BILITOT 0.7 04/25/2013 0937   PROT 7.8 06/26/2023 1356   PROT 8.1 04/25/2013 0937   ALBUMIN  4.0 06/26/2023 1356   ALBUMIN  4.2 04/25/2013 0937   Component Ref Range & Units 1 mo ago  Iron  50 - 170 ug/dL 62  TIBC 749.9 - 574.9 ug/dL 785.7 Low   Transferrin 250.0 - 380.0  mg/dL 829.9 Low   Iron  Saturation (%) % 29    RADIOGRAPHIC STUDIES: No results found.  PERFORMANCE STATUS (ECOG) : 0 - Asymptomatic  Review of Systems Unless otherwise noted, a complete review of systems is negative.  Physical Exam General: NAD Cardiovascular: regular rate and rhythm Pulmonary: clear ant fields Abdomen: soft, nontender, + bowel sounds GU: no suprapubic tenderness Extremities: no edema, no joint deformities Skin: no rashes Neurological: nonfocal  Assessment and Plan  Anemia in chronic kidney disease Chronic anemia due to end stage renal disease last iron  panel 1 month ago adequate no need for iron  at this time.  Symptomatic with fatigue, dyspnea, weakness, and dizziness. Previous transfusions, no recent iron  or erythropoiesis-stimulating agents. Goal: improve hemoglobin stability, reduce transfusions. - Proceed with  1 unit pRBC today - Follow-up in 1 week to reassess hemoglobin and anemia management. - called and left message for nephrology team regarding the last EPO injection and iron  transfusion awaiting a call back  End stage renal disease On maintenance hemodialysis thrice weekly. Contributes to chronic anemia and impaired iron  absorption. Experiences intradialytic hypotension. - Confirmed dialysis schedule and reviewed intradialytic symptoms.   Visit consisted of counseling and education dealing with the complex and emotionally intense issues of symptom management in the setting of serious illness.Greater than 50%  of this time was spent counseling and coordinating care related to the above assessment and plan.  Follow up plan:  Proceed with blood today F/U next week D1 cbc, hold tube D2 see MD/NP possible blood LP    Signed by: Morna Husband AGNP-C 02/18/24     "

## 2024-02-18 NOTE — Progress Notes (Deleted)
 "  Virtual Visit Progress Note  Symptom Management Clinic  [facility] A Department of the [place]. [facility] [address] [place], [place] [address] [phone] (phone) [phone] (fax)  I connected with Megan Lambert on 02/18/24 at 10:00 AM EST by telephone visit and verified that I am speaking with the correct person using two identifiers.   I discussed the limitations, risks, security and privacy concerns of performing an evaluation and management service by telemedicine and the availability of in-person appointments. I also discussed with the patient that there may be a patient responsible charge related to this service. The patient expressed understanding and agreed to proceed.   Other persons participating in the visit and their role in the encounter: none   Patients location: home  Providers location: clinic   Chief Complaint: CKD of stage V kidney disease    Patient Care Team: Keven Crumbly Pap, MD as PCP - General (Family Medicine) Rennie Cindy SAUNDERS, MD as Consulting Physician (Oncology) [place], Teodoro K, MD as Consulting Physician (Gastroenterology)   Name of the patient: Megan Lambert  [id number]  1970-08-20   Date of visit: 02/18/24  Heme/Onc History:  Oncology History   No problem history exists.    Interval history- Megan Lambert is a 54 y.o. female with history of stage V CKD who agrees to telephone visit for follow up of anemia due to chronic kidney disease. In interim, she has started hemodialysis on T-Th-Sa. She is now receiving EPO, transfusions, and iron  infusions at dialysis. She denies complaints.   Review of systems- Review of Systems  Constitutional:  Positive for malaise/fatigue. Negative for diaphoresis and fever.  Respiratory:  Positive for shortness of breath.   Cardiovascular:  Negative for chest pain.  Gastrointestinal:  Positive for nausea. Negative for blood  in stool and heartburn.  Neurological:  Positive for dizziness.    Allergies  Allergen Reactions   Lisinopril Swelling   Past Medical History:  Diagnosis Date   Anemia    Asthma    Chronic kidney disease    ESRD on dialysis   Dysrhythmia    Fibroids    Uterine   Hypertension    Pneumonia    Past Surgical History:  Procedure Laterality Date   AV FISTULA PLACEMENT Left 08/26/2023   Procedure: ARTERIOVENOUS (AV) FISTULA CREATION;  Surgeon: Jama Cordella MATSU, MD;  Location: [facility] ORS;  Service: Vascular;  Laterality: Left;  BRACHIOCEPHALIC   DIALYSIS/PERMA CATHETER INSERTION N/A 07/10/2023   Procedure: DIALYSIS/PERMA CATHETER INSERTION;  Surgeon: Jama Cordella MATSU, MD;  Location: [facility] INVASIVE CV LAB;  Service: Cardiovascular;  Laterality: N/A;   TUBAL LIGATION     Current Outpatient Medications:    ergocalciferol (VITAMIN D2) 1.25 MG (50000 UT) capsule, Take 50,000 Units by mouth every Monday., Disp: , Rfl:    HYDROcodone -acetaminophen  (NORCO/VICODIN) 5-325 MG tablet, Take 1 tablet by mouth every 6 (six) hours as needed for moderate pain (pain score 4-6) or severe pain (pain score 7-10)., Disp: 30 tablet, Rfl: 0   rosuvastatin (CRESTOR) 5 MG tablet, Take 5 mg by mouth in the morning., Disp: , Rfl:    sevelamer carbonate (RENVELA) 800 MG tablet, Take 800 mg by mouth daily with breakfast., Disp: , Rfl:    amLODipine  (NORVASC ) 10 MG tablet, Take 10 mg by mouth in the morning. (Patient not taking: Reported on 02/18/2024), Disp: , Rfl:   Physical exam: Exam limited due to telemedicine  Vitals:  02/18/24 1015  BP: 113/73  Pulse: 92  Resp: 16  Temp: 98.5 F (36.9 C)  TempSrc: Tympanic  SpO2: 100%  Weight: 181 lb (82.1 kg)   Physical Exam Pulmonary:     Effort: No respiratory distress.  Neurological:     Mental Status: She is oriented to person, place, and time.       Latest Ref Rng & Units 02/17/2024   11:10 AM  CBC  WBC 4.0 - 10.5 K/uL 5.9   Hemoglobin  12.0 - 15.0 g/dL 7.1   Hematocrit 63.9 - 46.0 % 21.7   Platelets 150 - 400 K/uL 155    No results found.  Assessment and plan- Patient is a 54 y.o. female   Anemia of Stage V CKD- ( s/p kidney biopsy [facility] Dr Myna). Severe and progressive. Previously referred for transplant but has not followed up. She has now started on dialysis. Discussed that typically supportive care of her blood counts is provided by nephrology at her dialysis appointments as opposed to additional appointments at hematology office. She can return to clinic as needed in the future.    Visit Diagnosis No diagnosis found.  Patient expressed understanding and was in agreement with this plan. She also understands that She can call clinic at any time with any questions, concerns, or complaints.   I discussed the assessment and treatment plan with the patient. The patient was provided an opportunity to ask questions and all were answered. The patient agreed with the plan and demonstrated an understanding of the instructions.   The patient was advised to call back or seek an in-person evaluation if the symptoms worsen or if the condition fails to improve as anticipated.  I spent 15 minutes on this telephone encounter.   Thank you for allowing me to participate in the care of this patient.   Tinnie Dawn, DNP, AGNP-C [facility]  "

## 2024-02-19 LAB — TYPE AND SCREEN
ABO/RH(D): O NEG
Antibody Screen: NEGATIVE
Unit division: 0

## 2024-02-19 LAB — BPAM RBC
Blood Product Expiration Date: 202602012359
ISSUE DATE / TIME: 202601151057
Unit Type and Rh: 5100

## 2024-02-25 ENCOUNTER — Inpatient Hospital Stay

## 2024-02-25 ENCOUNTER — Other Ambulatory Visit: Payer: Self-pay

## 2024-02-25 ENCOUNTER — Inpatient Hospital Stay (HOSPITAL_BASED_OUTPATIENT_CLINIC_OR_DEPARTMENT_OTHER): Admitting: Nurse Practitioner

## 2024-02-25 ENCOUNTER — Encounter: Payer: Self-pay | Admitting: Nurse Practitioner

## 2024-02-25 ENCOUNTER — Other Ambulatory Visit: Payer: Self-pay | Admitting: *Deleted

## 2024-02-25 VITALS — BP 103/62 | HR 108 | Temp 98.4°F | Resp 16 | Wt 182.0 lb

## 2024-02-25 DIAGNOSIS — D649 Anemia, unspecified: Secondary | ICD-10-CM

## 2024-02-25 DIAGNOSIS — D631 Anemia in chronic kidney disease: Secondary | ICD-10-CM | POA: Diagnosis not present

## 2024-02-25 DIAGNOSIS — N185 Chronic kidney disease, stage 5: Secondary | ICD-10-CM | POA: Diagnosis not present

## 2024-02-25 DIAGNOSIS — D509 Iron deficiency anemia, unspecified: Secondary | ICD-10-CM | POA: Diagnosis not present

## 2024-02-25 DIAGNOSIS — Z9289 Personal history of other medical treatment: Secondary | ICD-10-CM | POA: Insufficient documentation

## 2024-02-25 DIAGNOSIS — R7989 Other specified abnormal findings of blood chemistry: Secondary | ICD-10-CM | POA: Insufficient documentation

## 2024-02-25 LAB — RETIC PANEL
Immature Retic Fract: 16.2 % — ABNORMAL HIGH (ref 2.3–15.9)
RBC.: 2.54 MIL/uL — ABNORMAL LOW (ref 3.87–5.11)
Retic Count, Absolute: 41.7 K/uL (ref 19.0–186.0)
Retic Ct Pct: 1.6 % (ref 0.4–3.1)
Reticulocyte Hemoglobin: 27.8 pg — ABNORMAL LOW

## 2024-02-25 LAB — CBC WITH DIFFERENTIAL (CANCER CENTER ONLY)
Abs Immature Granulocytes: 0.03 K/uL (ref 0.00–0.07)
Basophils Absolute: 0.1 K/uL (ref 0.0–0.1)
Basophils Relative: 1 %
Eosinophils Absolute: 0.1 K/uL (ref 0.0–0.5)
Eosinophils Relative: 2 %
HCT: 23.2 % — ABNORMAL LOW (ref 36.0–46.0)
Hemoglobin: 7.7 g/dL — ABNORMAL LOW (ref 12.0–15.0)
Immature Granulocytes: 1 %
Lymphocytes Relative: 42 %
Lymphs Abs: 2.2 K/uL (ref 0.7–4.0)
MCH: 30.3 pg (ref 26.0–34.0)
MCHC: 33.2 g/dL (ref 30.0–36.0)
MCV: 91.3 fL (ref 80.0–100.0)
Monocytes Absolute: 0.5 K/uL (ref 0.1–1.0)
Monocytes Relative: 10 %
Neutro Abs: 2.3 K/uL (ref 1.7–7.7)
Neutrophils Relative %: 44 %
Platelet Count: 171 K/uL (ref 150–400)
RBC: 2.54 MIL/uL — ABNORMAL LOW (ref 3.87–5.11)
RDW: 17.6 % — ABNORMAL HIGH (ref 11.5–15.5)
WBC Count: 5.2 K/uL (ref 4.0–10.5)
nRBC: 0 % (ref 0.0–0.2)

## 2024-02-25 LAB — IRON AND TIBC
Iron: 196 ug/dL — ABNORMAL HIGH (ref 28–170)
Saturation Ratios: 83 % — ABNORMAL HIGH (ref 10.4–31.8)
TIBC: 235 ug/dL — ABNORMAL LOW (ref 250–450)
UIBC: 39 ug/dL

## 2024-02-25 LAB — PREPARE RBC (CROSSMATCH)

## 2024-02-25 LAB — LACTATE DEHYDROGENASE: LDH: 252 U/L — ABNORMAL HIGH (ref 105–235)

## 2024-02-25 LAB — FERRITIN: Ferritin: 2932 ng/mL — ABNORMAL HIGH (ref 11–307)

## 2024-02-25 NOTE — Progress Notes (Signed)
 "  Symptom Management Clinic  [facility] A Department of the [place]. [facility] [address] [place], [place] [address] [phone] (phone) [phone] (fax)  Patient Care Team: Keven Crumbly Pap, MD as PCP - General (Family Medicine) Rennie Cindy SAUNDERS, MD as Consulting Physician (Oncology) Aundria Ladell POUR, MD as Consulting Physician (Gastroenterology)   Name of the patient: Megan Lambert  [id number]  1970/10/23   Date of visit: 02/26/24  Diagnosis- Anemia   Chief complaint/ Reason for visit- symptomatic anemia  Heme/Onc History:  Oncology History   No problem history exists.    Interval history- Megan Lambert is a 54 y.o. female with above history of anemia who returns to clinic for follow up. She continues dialysis. Is fatigued. Now out of work due to dialysis. Stays active caring for 89 year old grandchild. Denies any neurologic complaints. Denies recent fevers or illnesses. Denies any easy bleeding or bruising. No melena or hematochezia. No pica or restless leg. Reports good appetite and denies weight loss. Denies chest pain. Denies any nausea, vomiting, constipation, or diarrhea. Denies urinary complaints. Patient offers no further specific complaints today. She has not had a colonoscopy.   Review of systems- Review of Systems  Constitutional:  Positive for malaise/fatigue. Negative for chills, fever and weight loss.  HENT:  Negative for hearing loss, nosebleeds, sore throat and tinnitus.   Eyes:  Negative for blurred vision and double vision.  Respiratory:  Negative for cough, hemoptysis, shortness of breath and wheezing.   Cardiovascular:  Negative for chest pain, palpitations and leg swelling.  Gastrointestinal:  Negative for abdominal pain, blood in stool, constipation, diarrhea, melena, nausea and vomiting.  Genitourinary:  Negative for dysuria and urgency.  Musculoskeletal:  Negative for back pain,  falls, joint pain and myalgias.  Skin:  Negative for itching and rash.  Neurological:  Negative for dizziness, tingling, sensory change, loss of consciousness, weakness and headaches.  Endo/Heme/Allergies:  Negative for environmental allergies. Does not bruise/bleed easily.  Psychiatric/Behavioral:  Negative for depression. The patient is not nervous/anxious and does not have insomnia.     Allergies[1]  Past Medical History:  Diagnosis Date   Anemia    Anemia due to stage 5 chronic kidney disease, not on chronic dialysis (HCC) 05/30/2022   Asthma    Chronic kidney disease    ESRD on dialysis   Dysrhythmia    Fibroids    Uterine   History of recent blood transfusion 02/25/2024   Hypertension    Pneumonia     Past Surgical History:  Procedure Laterality Date   AV FISTULA PLACEMENT Left 08/26/2023   Procedure: ARTERIOVENOUS (AV) FISTULA CREATION;  Surgeon: Jama Cordella MATSU, MD;  Location: [facility] ORS;  Service: Vascular;  Laterality: Left;  BRACHIOCEPHALIC   DIALYSIS/PERMA CATHETER INSERTION N/A 07/10/2023   Procedure: DIALYSIS/PERMA CATHETER INSERTION;  Surgeon: Jama Cordella MATSU, MD;  Location: [facility] INVASIVE CV LAB;  Service: Cardiovascular;  Laterality: N/A;   TUBAL LIGATION      Social History   Socioeconomic History   Marital status: Single    Spouse name: Not on file   Number of children: Not on file   Years of education: Not on file   Highest education level: Not on file  Occupational History   Not on file  Tobacco Use   Smoking status: Never    Passive exposure: Never   Smokeless tobacco: Never  Vaping Use   Vaping status: Never Used  Substance  and Sexual Activity   Alcohol use: Yes    Comment: a few shots a day.   Drug use: No   Sexual activity: Not Currently  Other Topics Concern   Not on file  Social History Narrative   Lives with Mom   Social Drivers of Health   Tobacco Use: Low Risk (02/25/2024)   Patient History    Smoking Tobacco Use: Never     Smokeless Tobacco Use: Never    Passive Exposure: Never  Financial Resource Strain: Medium Risk (04/16/2022)   Received from [facility]   Overall Financial Resource Strain (CARDIA)    Difficulty of Paying Living Expenses: Somewhat hard  Food Insecurity: No Food Insecurity (09/18/2023)   Received from [facility]   Epic    Within the past 12 months, you worried that your food would run out before you got the money to buy more.: Never true    Within the past 12 months, the food you bought just didn't last and you didn't have money to get more.: Never true  Transportation Needs: No Transportation Needs (09/18/2023)   Received [facility]   PRAPARE - Transportation    Lack of Transportation (Medical): No    Lack of Transportation (Non-Medical): No  Physical Activity: Not on file  Stress: No Stress Concern Present (04/16/2022)   Received from [facility] of Occupational Health - Occupational Stress Questionnaire    Feeling of Stress : Only a little  Social Connections: Not on file  Intimate Partner Violence: Not At Risk (10/20/2022)   Humiliation, Afraid, Rape, and Kick questionnaire    Fear of Current or Ex-Partner: No    Emotionally Abused: No    Physically Abused: No    Sexually Abused: No  Depression (PHQ2-9): Low Risk (02/25/2024)   Depression (PHQ2-9)    PHQ-2 Score: 0  Alcohol Screen: Not on file  Housing: Medium Risk (10/20/2022)   Housing    Last Housing Risk Score: 1  Utilities: Low Risk (09/18/2023)   Received from [facility]   Utilities    Within the past 12 months, have you been unable to get utilities(heat, electricity) when it was really needed?: No  Health Literacy: Not on file    Family History  Problem Relation Age of Onset   Anemia Mother    Colon cancer Neg Hx    Esophageal cancer Neg Hx     Current Medications[2]  Physical exam:  Vitals:   02/25/24 1120  BP: 103/62  Pulse: (!) 108  Resp: 16  Temp: 98.4 F  (36.9 C)  TempSrc: Tympanic  SpO2: 100%  Weight: 182 lb (82.6 kg)   Physical Exam Vitals reviewed.  Constitutional:      Comments: Chronically ill appearing  Cardiovascular:     Rate and Rhythm: Normal rate.  Pulmonary:     Effort: No respiratory distress.  Abdominal:     General: There is no distension.     Tenderness: There is no abdominal tenderness.  Skin:    Coloration: Skin is pale.  Neurological:     Mental Status: She is alert and oriented to person, place, and time.  Psychiatric:        Mood and Affect: Mood normal.        Behavior: Behavior normal.        Latest Ref Rng & Units 02/25/2024   10:54 AM  CBC  WBC 4.0 - 10.5 K/uL 5.2  Hemoglobin 12.0 - 15.0 g/dL 7.7   Hematocrit 63.9 - 46.0 % 23.2   Platelets 150 - 400 K/uL 171    No results found.  Assessment and plan- Patient is a 54 y.o. female who returns to clinic for follow up of:   Symptomatic anemia- disproportionate to anemia of CKD. Baseline 9 but hemoglobin today 7.7. I spoke to her nephrologist and confirmed that she is receiving ESA/Mircera with last dose of 200 mcg on 1/3 and she will receive next dose tomorrow. Iron  stores are normal. I discussed with Dr Rennie and recommend bone marrow biopsy to further evaluate etiology of her anemia. Discussed risks and alternatives. Patient in agreement. We will continue to follow her counts weekly in the interim with possible transfusion for hemoglobin < 7 or < 8 and increasing symptoms. She will follow up with Dr Rennie for results.  Anemia of CKD, stage 5. Confirmed with nephrology that patient is maximized on Mircera/ESA. She takes dialysis M-W-F and finishes around 10 am.  Iron  Deficiency Anemia-  I rechecked iron  stores today and they are well replenished. Nephrology reports a history of GI bleed but patient denies this and is asymptomatic. She is scheduled to see GI for colonoscopy. Hold IV iron .   Disposition:  Add on labs today (ferritin, iron   studies, ldh, retic) Hold venofer today.  1 unit pRBCs tomorrow (after 10 d/t dialysis) Bone marrow - next available 1 after- lab (cbc, cmp, hold tube), Dr Rennie D2- +/- 1 unit pRBCs Weekly lab (cbc, hold tube) with D2 +/- 1 unit pRBCs- la   Visit Diagnosis 1. Symptomatic anemia   2. Anemia in stage 5 chronic kidney disease, not on chronic dialysis [facility])    Patient expressed understanding and was in agreement with this plan. She also understands that She can call clinic at any time with any questions, concerns, or complaints.   Thank you for allowing me to participate in the care of this patient.   Tinnie Dawn, DNP, AGNP-C, AOCNP [facility] [phone]     [1]  Allergies Allergen Reactions   Lisinopril Swelling  [2]  Current Outpatient Medications:    ergocalciferol (VITAMIN D2) 1.25 MG (50000 UT) capsule, Take 50,000 Units by mouth every Monday., Disp: , Rfl:    heparin  sodium, porcine, 1000 UNIT/ML injection, Heparin  Sodium (Porcine) 1,000 Units/mL Systemic, Disp: , Rfl:    HYDROcodone -acetaminophen  (NORCO/VICODIN) 5-325 MG tablet, Take 1 tablet by mouth every 6 (six) hours as needed for moderate pain (pain score 4-6) or severe pain (pain score 7-10)., Disp: 30 tablet, Rfl: 0   Methoxy PEG-Epoetin Beta (MIRCERA IJ), 200 mcg every 14 (fourteen) days., Disp: , Rfl:    metoprolol succinate (TOPROL-XL) 50 MG 24 hr tablet, Take 50 mg by mouth daily., Disp: , Rfl:    Na Sulfate-K Sulfate-Mg Sulfate concentrate (SUPREP) 17.5-3.13-1.6 GM/177ML SOLN, , Disp: , Rfl:    pantoprazole  (PROTONIX ) 20 MG tablet, Take 20 mg by mouth daily., Disp: , Rfl:    rosuvastatin (CRESTOR) 5 MG tablet, Take 5 mg by mouth in the morning., Disp: , Rfl:    sertraline (ZOLOFT) 100 MG tablet, Take 100 mg by mouth daily., Disp: , Rfl:    sevelamer carbonate (RENVELA) 800 MG tablet, Take 800 mg by mouth daily with breakfast., Disp: , Rfl:    amLODipine  (NORVASC ) 10 MG tablet, Take  10 mg by mouth in the morning. (Patient not taking: Reported on 02/25/2024), Disp: , Rfl:   "

## 2024-02-26 ENCOUNTER — Inpatient Hospital Stay

## 2024-02-29 LAB — BPAM RBC
Blood Product Expiration Date: 202602162359
Unit Type and Rh: 5100

## 2024-02-29 LAB — TYPE AND SCREEN
ABO/RH(D): O NEG
Antibody Screen: NEGATIVE
Unit division: 0

## 2024-03-01 ENCOUNTER — Telehealth: Payer: Self-pay | Admitting: *Deleted

## 2024-03-01 ENCOUNTER — Other Ambulatory Visit: Payer: Self-pay | Admitting: *Deleted

## 2024-03-01 ENCOUNTER — Inpatient Hospital Stay

## 2024-03-01 DIAGNOSIS — D631 Anemia in chronic kidney disease: Secondary | ICD-10-CM

## 2024-03-01 DIAGNOSIS — D509 Iron deficiency anemia, unspecified: Secondary | ICD-10-CM | POA: Diagnosis not present

## 2024-03-01 DIAGNOSIS — D649 Anemia, unspecified: Secondary | ICD-10-CM

## 2024-03-01 LAB — CBC WITH DIFFERENTIAL (CANCER CENTER ONLY)
Abs Immature Granulocytes: 0.03 10*3/uL (ref 0.00–0.07)
Basophils Absolute: 0 10*3/uL (ref 0.0–0.1)
Basophils Relative: 1 %
Eosinophils Absolute: 0.1 10*3/uL (ref 0.0–0.5)
Eosinophils Relative: 2 %
HCT: 23.5 % — ABNORMAL LOW (ref 36.0–46.0)
Hemoglobin: 7.8 g/dL — ABNORMAL LOW (ref 12.0–15.0)
Immature Granulocytes: 1 %
Lymphocytes Relative: 32 %
Lymphs Abs: 1.6 10*3/uL (ref 0.7–4.0)
MCH: 30.4 pg (ref 26.0–34.0)
MCHC: 33.2 g/dL (ref 30.0–36.0)
MCV: 91.4 fL (ref 80.0–100.0)
Monocytes Absolute: 0.5 10*3/uL (ref 0.1–1.0)
Monocytes Relative: 9 %
Neutro Abs: 2.8 10*3/uL (ref 1.7–7.7)
Neutrophils Relative %: 55 %
Platelet Count: 159 10*3/uL (ref 150–400)
RBC: 2.57 MIL/uL — ABNORMAL LOW (ref 3.87–5.11)
RDW: 16.8 % — ABNORMAL HIGH (ref 11.5–15.5)
WBC Count: 5 10*3/uL (ref 4.0–10.5)
nRBC: 0 % (ref 0.0–0.2)

## 2024-03-01 LAB — IRON AND TIBC
Iron: 192 ug/dL — ABNORMAL HIGH (ref 28–170)
Saturation Ratios: 77 % — ABNORMAL HIGH (ref 10.4–31.8)
TIBC: 251 ug/dL (ref 250–450)
UIBC: 59 ug/dL

## 2024-03-01 LAB — PREPARE RBC (CROSSMATCH)

## 2024-03-01 LAB — FERRITIN: Ferritin: 2844 ng/mL — ABNORMAL HIGH (ref 11–307)

## 2024-03-01 NOTE — Telephone Encounter (Signed)
 Pt has a Bone marrow biopsy scheduled for 03/10/24 arrival time 7:30 am. [facility]. Do not eat or drink 8 hrs prior to procedure. She will need a driver for the procedure. Pt agrees to above appt.

## 2024-03-02 ENCOUNTER — Inpatient Hospital Stay

## 2024-03-02 DIAGNOSIS — D509 Iron deficiency anemia, unspecified: Secondary | ICD-10-CM | POA: Diagnosis not present

## 2024-03-02 DIAGNOSIS — D649 Anemia, unspecified: Secondary | ICD-10-CM

## 2024-03-02 DIAGNOSIS — D631 Anemia in chronic kidney disease: Secondary | ICD-10-CM

## 2024-03-02 MED ORDER — ACETAMINOPHEN 325 MG PO TABS
650.0000 mg | ORAL_TABLET | Freq: Once | ORAL | Status: AC
  Administered 2024-03-02: 650 mg via ORAL
  Filled 2024-03-02: qty 2

## 2024-03-02 MED ORDER — DIPHENHYDRAMINE HCL 25 MG PO TABS
25.0000 mg | ORAL_TABLET | Freq: Once | ORAL | Status: AC
  Administered 2024-03-02: 25 mg via ORAL
  Filled 2024-03-02: qty 1

## 2024-03-02 NOTE — Patient Instructions (Signed)

## 2024-03-03 LAB — TYPE AND SCREEN
ABO/RH(D): O NEG
Antibody Screen: NEGATIVE
Unit division: 0

## 2024-03-03 LAB — BPAM RBC
Blood Product Expiration Date: 202601302359
ISSUE DATE / TIME: 202601281329
Unit Type and Rh: 202601302359
Unit Type and Rh: 9500

## 2024-03-08 ENCOUNTER — Encounter: Payer: Self-pay | Admitting: Internal Medicine

## 2024-03-09 ENCOUNTER — Telehealth: Payer: Self-pay | Admitting: *Deleted

## 2024-03-09 ENCOUNTER — Inpatient Hospital Stay: Attending: Internal Medicine

## 2024-03-09 ENCOUNTER — Other Ambulatory Visit (HOSPITAL_COMMUNITY): Payer: Self-pay | Admitting: Radiology

## 2024-03-09 DIAGNOSIS — N183 Chronic kidney disease, stage 3 unspecified: Secondary | ICD-10-CM

## 2024-03-09 DIAGNOSIS — D649 Anemia, unspecified: Secondary | ICD-10-CM

## 2024-03-09 DIAGNOSIS — D631 Anemia in chronic kidney disease: Secondary | ICD-10-CM

## 2024-03-09 LAB — CBC WITH DIFFERENTIAL (CANCER CENTER ONLY)
Abs Immature Granulocytes: 0.03 10*3/uL (ref 0.00–0.07)
Basophils Absolute: 0 10*3/uL (ref 0.0–0.1)
Basophils Relative: 1 %
Eosinophils Absolute: 0.1 10*3/uL (ref 0.0–0.5)
Eosinophils Relative: 2 %
HCT: 25 % — ABNORMAL LOW (ref 36.0–46.0)
Hemoglobin: 8.4 g/dL — ABNORMAL LOW (ref 12.0–15.0)
Immature Granulocytes: 1 %
Lymphocytes Relative: 44 %
Lymphs Abs: 1.7 10*3/uL (ref 0.7–4.0)
MCH: 30.7 pg (ref 26.0–34.0)
MCHC: 33.6 g/dL (ref 30.0–36.0)
MCV: 91.2 fL (ref 80.0–100.0)
Monocytes Absolute: 0.4 10*3/uL (ref 0.1–1.0)
Monocytes Relative: 9 %
Neutro Abs: 1.7 10*3/uL (ref 1.7–7.7)
Neutrophils Relative %: 43 %
Platelet Count: 152 10*3/uL (ref 150–400)
RBC: 2.74 MIL/uL — ABNORMAL LOW (ref 3.87–5.11)
RDW: 16.7 % — ABNORMAL HIGH (ref 11.5–15.5)
WBC Count: 3.9 10*3/uL — ABNORMAL LOW (ref 4.0–10.5)
nRBC: 0 % (ref 0.0–0.2)

## 2024-03-09 LAB — SAMPLE TO BLOOD BANK

## 2024-03-09 NOTE — Telephone Encounter (Signed)
 Canceled Blood Transfusion appt for tomorrow as Hgb 8.4. Tried to notify patient by phone. No answer and mailbox has not been set up.

## 2024-03-09 NOTE — Telephone Encounter (Signed)
 Pt called back and confirmed that appt for tomorrow has been canceled

## 2024-03-10 ENCOUNTER — Ambulatory Visit: Admitting: Radiology

## 2024-03-10 ENCOUNTER — Inpatient Hospital Stay

## 2024-03-16 ENCOUNTER — Inpatient Hospital Stay

## 2024-03-17 ENCOUNTER — Inpatient Hospital Stay

## 2024-03-17 ENCOUNTER — Inpatient Hospital Stay: Admitting: Internal Medicine

## 2024-03-17 ENCOUNTER — Ambulatory Visit: Admitting: Radiology
